# Patient Record
Sex: Male | Born: 1970 | Race: White | Hispanic: No
Health system: Midwestern US, Academic
[De-identification: ages and names within clinical notes are randomized; demographics above are authoritative.]

---

## 2016-04-23 MED ORDER — EMTRICITABINE-TENOFOVIR (TDF) 200-300 MG PO TAB
1 | ORAL_TABLET | Freq: Every day | ORAL | 3 refills | 30.00000 days | Status: DC
Start: 2016-04-23 — End: 2016-06-09

## 2016-06-09 MED ORDER — AZITHROMYCIN 1 GRAM PO PACK
1 | Freq: Once | ORAL | 0 refills | Status: AC
Start: 2016-06-09 — End: ?

## 2016-06-09 MED ORDER — ABACAVIR-DOLUTEGRAVIR-LAMIVUD 600-50-300 MG PO TAB
1 | ORAL_TABLET | Freq: Every day | ORAL | 3 refills | Status: DC
Start: 2016-06-09 — End: 2016-09-22

## 2016-06-28 ENCOUNTER — Encounter: Admit: 2016-06-28 | Discharge: 2016-06-28

## 2016-06-28 NOTE — Telephone Encounter
Attempted to call Jolaine Click to verify compliance and assess tolerance of his specialty medications (planned to switch to Triumeq). No answer. Left voicemail asking patient to return call to pharmacist at [phone].    Hubert Azure, PharmD, [place], AAHIVP  [phone]

## 2016-06-28 NOTE — Telephone Encounter
Periodic Patient Reassessment: Antiretroviral Therapy         Indication / Regimen     abacavir 600mg /lamivudine 300mg /dolutegravir 50mg  (Triumeq) - one tablet by mouth once daily       Charles Walls takes their medications in the morning with breakfast.    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 It is planned to continue indefinitely which is appropriate for Charles Walls. No renal or hepatic adjustments are required.    Subjective Clinical Assessment: 10 out of 10.   Subjective Quality of Life Measurement: In the past 30 days, Charles Walls was able to complete all normal daily activities    Response to HIV antiretroviral therapy:      Laboratory studies will be collected at his appointment in July to determine HIV virologic and immunologic response.       Labs:   CD4 Count   Date/Time Value Ref Range Status   05/21/2016 09:33 AM 804 440 - 2,160 Final          HIV Viral Load PCR   Date/Time Value Ref Range Status   05/21/2016 09:33 AM HIV-1 RNA Detected, <40 copies/mL <40 RNA COPIES/ML Final     Comment:     The test method detects HIV-1 viral load using the Abbott RealTime assay.    Please correlate results with the clinical status of the patient.         Adverse Effects:    Patient is not experiencing any significant adverse effects to this medication regimen.      Adherence:    Refill history was reviewed.  Patient reports missing 0 doses since switching to Triumeq on 06/12/16.   Patient was educated on importance of adherence.     Allergies     Allergies   Allergen Reactions   ??? Sulfa (Sulfonamide Antibiotics) SEE COMMENTS     Patient reports that his father and brother are allergic, he is unsure so he reports it as an allergy.         Pregnancy status    The patient???s pregnancy status was assessed and determined to be: Male, not applicable     Medication Reconciliation Medication history and reconciliation were performed (including prescription medications, supplements, over the counter, and herbal products). The medication list was updated and the patients??? current medication list is included below.    Home Medications    Medication Sig   abacavir-dolutegravir-lamivud (TRIUMEQ) 600-50-300 mg tablet Take 1 tablet by mouth daily. To replace Truvada, Darunavir/Ritonavir.   diphenhydrAMINE (BENADRYL) 25 mg capsule Take 25-50 mg by mouth at bedtime as needed.   GINSENG PO Take 1 tablet by mouth daily.       The patient was instructed to speak with their health care provider before starting any new drug, including prescription or over the counter, natural / herbal products, or vitamins.    Drug-Drug Interactions    Drug-drug interactions (DDIs) have been evaluated and there are no new significant drug interactions with Charles Walls???s anti-retroviral therapy to address with the patient at this time.      Drug-Food Interactions    Drug-food interactions were evaluated and there are no significant drug-food interactions. This medication can be taken with or without food.     Risk Evaluation and Mitigation Strategy (REMS) Assessment    No REMS is required for this medication.     Medication Education    Charles Hart  Ryland Walls was provided with drug information for their HIV medications.  Discussion with the patient included: the medication name, regimen, dosing, frequency, duration, proper administration, storage, monitoring, common side effects, safety precautions, and food/drug interactions to be aware of.  The patient was instructed to speak to ID clinic doctor or pharmacist prior to starting any new drug--including prescription or OTC, natural products, or vitamins.  Emphasis was placed on the importance of medication compliance.  The patient was instructed to seek medical attention immediately if experiencing signs of an allergic reaction, including: rash, hives, itching; red, swollen, blistered, or peeling skin with or without fever.  The patient was also encouraged to contact the pharmacist with any further questions.    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    No issues.    Reminded patient of his follow up appointment with Charles Walls in July.    Re-assessment has been completed and the patient will be re-assessed annually.      Charles Walls, PharmD, BCPS, AAHIVP  Phone [phone]  Pager [phone]

## 2016-07-05 ENCOUNTER — Encounter: Admit: 2016-07-05 | Discharge: 2016-07-05

## 2016-07-06 NOTE — Telephone Encounter
Last OV 06/09/16:    Plan:    - Offered ENT referral for audiogram. He declined.   - Prescription sent for 1g Azithromycin one time dose for exposure to Chlamydia.  - Start taking Vitamin D 2000 IU daily. These are OTC.   - Wants to switch to Triumeq: HLA B*[id number] negative and Archive genotype noted wild type virus.  - Follow up in 8 weeks for repeat labs (including Syphilis Ab).       Pt has appt 07/28/16

## 2016-07-08 MED ORDER — AZITHROMYCIN 500 MG PO TAB
ORAL_TABLET | 0 refills | Status: AC
Start: 2016-07-08 — End: 2016-09-01

## 2016-07-28 ENCOUNTER — Encounter: Admit: 2016-07-28 | Discharge: 2016-07-28

## 2016-07-28 DIAGNOSIS — B20 Human immunodeficiency virus [HIV] disease: Principal | ICD-10-CM

## 2016-07-29 ENCOUNTER — Ambulatory Visit: Admit: 2016-07-29 | Discharge: 2016-07-29 | Payer: No Typology Code available for payment source

## 2016-07-29 DIAGNOSIS — B20 Human immunodeficiency virus [HIV] disease: Principal | ICD-10-CM

## 2016-07-29 LAB — COMPREHENSIVE METABOLIC PANEL
Lab: 0.4 mg/dL (ref 0.3–1.2)
Lab: 1.1 mg/dL (ref 0.4–1.24)
Lab: 10 mg/dL — ABNORMAL HIGH (ref 8.5–10.6)
Lab: 117 U/L — ABNORMAL HIGH (ref 25–110)
Lab: 135 MMOL/L — ABNORMAL LOW (ref 137–147)
Lab: 27 MMOL/L (ref 21–30)
Lab: 3.9 MMOL/L (ref 3.5–5.1)
Lab: 4.3 g/dL (ref 3.5–5.0)
Lab: 6 K/UL (ref 3–12)
Lab: 8.5 g/dL — ABNORMAL HIGH (ref 6.0–8.0)
Lab: >60 mL/min — ABNORMAL HIGH (ref >60)

## 2016-07-29 LAB — SYPHILIS AB SCREEN: Lab: POSITIVE mL/min — AB (ref 0–0.45)

## 2016-07-29 LAB — CBC AND DIFF
Lab: 4.6 M/UL (ref 4.4–5.5)
Lab: 7.1 10*3/uL (ref 4.5–11.0)

## 2016-07-29 LAB — HEPATITIS C ANTIBODY W REFLEX HCV PCR QUANT: Lab: NEGATIVE mg/dL (ref 7–25)

## 2016-07-29 LAB — RPR SCREEN

## 2016-07-29 LAB — RPR TITER FOR TREATMENT: Lab: 64 — AB

## 2016-07-30 ENCOUNTER — Ambulatory Visit: Admit: 2016-07-30 | Discharge: 2016-07-31 | Payer: No Typology Code available for payment source

## 2016-07-30 ENCOUNTER — Encounter: Admit: 2016-07-30 | Discharge: 2016-07-30

## 2016-07-30 DIAGNOSIS — A539 Syphilis, unspecified: Principal | ICD-10-CM

## 2016-07-30 DIAGNOSIS — B20 Human immunodeficiency virus [HIV] disease: ICD-10-CM

## 2016-07-30 LAB — CHLAM/NG PCR URINE
Lab: NEGATIVE
Lab: NEGATIVE

## 2016-07-30 MED ORDER — PENICILLIN G BENZATHINE 1,200,000 UNIT/2 ML IM SYRG
2.4 10*6.[IU] | Freq: Once | INTRAMUSCULAR | 0 refills | Status: CP
Start: 2016-07-30 — End: ?

## 2016-07-30 NOTE — Telephone Encounter
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 Ask him to abstain from sex (until one week after treatment) and come in to get treated with 3 shots of Bicillin LA 2.4 million units weekly apart (make sure he is not allergic otherwise I'll ask Dr Cletus Gash to see him and discuss options).

## 2016-07-30 NOTE — Progress Notes
Patient here for Bicillin-LA IM shot. Denies having allergies to penicillin. Received 1.2 million units of Bicillin-LA in each buttocks (2.60million units total). Patient tolerated both injections well, waited for 15 minutes afterwards in waiting room, and had no signs of allergic reaction    Told pt that he will need to make a nurse visit for the next two Fridays pt understood

## 2016-07-30 NOTE — Telephone Encounter
Patient informed and will come in at 42 today for nurse visit.

## 2016-07-30 NOTE — Telephone Encounter
Called and LVM for return call.

## 2016-08-02 LAB — HIV VIRAL LOAD PCR QUANT

## 2016-08-06 ENCOUNTER — Ambulatory Visit: Admit: 2016-08-06 | Discharge: 2016-08-07 | Payer: No Typology Code available for payment source

## 2016-08-06 DIAGNOSIS — B20 Human immunodeficiency virus [HIV] disease: Principal | ICD-10-CM

## 2016-08-06 MED ORDER — PENICILLIN G BENZATHINE 1,200,000 UNIT/2 ML IM SYRG
2.4 10*6.[IU] | Freq: Once | INTRAMUSCULAR | 0 refills | Status: CP
Start: 2016-08-06 — End: ?

## 2016-08-06 NOTE — Progress Notes
Patient here for Bicillin-LA IM shot. Denies having allergies to penicillin. Received 1.2 million units of Bicillin-LA in each buttocks (2.2million units total). Patient tolerated both injections well, waited for 15 minutes afterwards in waiting room, and had no signs of allergic reaction    Pt has one more injection and will be here next Friday for final dose.

## 2016-08-19 ENCOUNTER — Encounter: Admit: 2016-08-19 | Discharge: 2016-08-19

## 2016-08-19 NOTE — Telephone Encounter
Patient called because he missed his last NV for 3rd bicillin treatment.    Per El Atrouni, must start series over again  Bicillin 2.4 million units x 3 weeks  Patient understands and will be here 8/3 at 2pm to restart treatment.  Patient understands he needs to come in 8/10 and 8/17 for the other two doses.

## 2016-08-20 ENCOUNTER — Ambulatory Visit: Admit: 2016-08-20 | Discharge: 2016-08-21 | Payer: No Typology Code available for payment source

## 2016-08-20 DIAGNOSIS — A539 Syphilis, unspecified: Principal | ICD-10-CM

## 2016-08-20 MED ORDER — PENICILLIN G BENZATHINE 1,200,000 UNIT/2 ML IM SYRG
2.4 10*6.[IU] | Freq: Once | INTRAMUSCULAR | 0 refills | Status: CP
Start: 2016-08-20 — End: ?

## 2016-08-20 NOTE — Progress Notes
Patient here for Bicillin-LA IM shot. Denies having allergies to penicillin. Received 1.2 million units times 2 of Bicillin-LA in the buttocks. Patient tolerated both injections well, waited for 15 minutes afterwards in waiting room, and had no signs of allergic reaction  Patient will return 8/10 and 8/17 for remaining two treatments

## 2016-08-27 ENCOUNTER — Ambulatory Visit: Admit: 2016-08-27 | Discharge: 2016-08-28 | Payer: No Typology Code available for payment source

## 2016-08-27 DIAGNOSIS — A539 Syphilis, unspecified: Principal | ICD-10-CM

## 2016-08-27 MED ORDER — PENICILLIN G BENZATHINE 1,200,000 UNIT/2 ML IM SYRG
1.2 10*6.[IU] | Freq: Once | INTRAMUSCULAR | 0 refills | Status: CP
Start: 2016-08-27 — End: ?

## 2016-08-27 NOTE — Progress Notes
Patient here for Bicillin-LA IM shot. Denies having allergies to penicillin. Received 1.2 million units times 2 of Bicillin-LA in the buttocks. Patient tolerated both injections well, waited for 15 minutes afterwards in waiting room, and had no signs of allergic reaction

## 2016-09-01 ENCOUNTER — Encounter: Admit: 2016-09-01 | Discharge: 2016-09-01

## 2016-09-01 ENCOUNTER — Ambulatory Visit: Admit: 2016-09-01 | Discharge: 2016-09-01 | Payer: No Typology Code available for payment source

## 2016-09-01 DIAGNOSIS — S129XXA Fracture of neck, unspecified, initial encounter: Principal | ICD-10-CM

## 2016-09-01 DIAGNOSIS — B20 Human immunodeficiency virus [HIV] disease: ICD-10-CM

## 2016-09-01 DIAGNOSIS — E785 Hyperlipidemia, unspecified: ICD-10-CM

## 2016-09-01 DIAGNOSIS — A539 Syphilis, unspecified: Principal | ICD-10-CM

## 2016-09-01 DIAGNOSIS — L409 Psoriasis, unspecified: ICD-10-CM

## 2016-09-01 DIAGNOSIS — T1491XA Suicide attempt, initial encounter: ICD-10-CM

## 2016-09-01 DIAGNOSIS — J40 Bronchitis, not specified as acute or chronic: ICD-10-CM

## 2016-09-01 MED ORDER — PENICILLIN G BENZATHINE 1,200,000 UNIT/2 ML IM SYRG
2.4 10*6.[IU] | Freq: Once | INTRAMUSCULAR | 0 refills | Status: CP
Start: 2016-09-01 — End: ?

## 2016-09-01 NOTE — Progress Notes
Periodic Patient Reassessment: Antiretroviral Therapy         Indication / Regimen     abacavir 600mg /lamivudine 300mg /dolutegravir 50mg  (Triumeq) - one tablet by mouth once daily       Tarran Piotrowski takes their medications in the morning with breakfast.  If he doesn't take with food, he is nauseated.    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 It is planned to continue indefinitely which is appropriate for Juliann Mule. No renal or hepatic adjustments are required.    Subjective Clinical Assessment: 10 out of 10.  Subjective Quality of Life Measurement: In the past 30 days, Saimon Rothbard was able to complete all normal daily activities    Response to HIV antiretroviral therapy:      Laboratory studies will be collected today to determine HIV virologic and immunologic response.       Labs:   CD4 Count   Date/Time Value Ref Range Status   05/21/2016 09:33 AM 804 440 - 2,160 Final          HIV Viral Load PCR   Date/Time Value Ref Range Status   07/29/2016 10:48 AM HIV-1 RNA Not Detected, <40 copies/mL <40 RNA COPIES/ML Final     Comment:     The test method detects HIV-1 viral load using the Abbott RealTime assay.    Please correlate results with the clinical status of the patient.         Adverse Effects:    Patient is not experiencing any significant adverse effects to this medication regimen.      Adherence:    Refill history was reviewed.  Patient reports missing 0 doses over the past 1 week and 0 doses over the past month.   Patient was educated on importance of adherence.      Allergies     Allergies   Allergen Reactions   ??? Sulfa (Sulfonamide Antibiotics) SEE COMMENTS     Patient reports that his father and brother are allergic, he is unsure so he reports it as an allergy.         Pregnancy status    The patient???s pregnancy status was assessed and determined to be: Male, not applicable Medication Reconciliation    Medication history and reconciliation were performed (including prescription medications, supplements, over the counter, and herbal products). The medication list was updated and the patients??? current medication list is included below.    Home Medications    Medication Sig   abacavir-dolutegravir-lamivud (TRIUMEQ) 600-50-300 mg tablet Take 1 tablet by mouth daily. To replace Truvada, Darunavir/Ritonavir.   GINSENG PO Take 1 tablet by mouth daily.       The patient was instructed to speak with their health care provider before starting any new drug, including prescription or over the counter, natural / herbal products, or vitamins.    Drug-Drug Interactions    Drug-drug interactions (DDIs) have been evaluated and there are no new significant drug interactions with Ulice Dash Bocek???s anti-retroviral therapy to address with the patient at this time.      Drug-Food Interactions    Drug-food interactions were evaluated and there are no significant drug-food interactions. This medication can be taken with or without food.     Risk Evaluation and Mitigation Strategy (REMS) Assessment    No REMS is required for this medication.     Medication Education    Delmonte Yerk was provided  with drug information for their HIV medications.  Discussion with the patient included: the medication name, regimen, dosing, frequency, duration, proper administration, storage, monitoring, common side effects, safety precautions, and food/drug interactions to be aware of.  The patient was instructed to speak to ID clinic doctor or pharmacist prior to starting any new drug--including prescription or OTC, natural products, or vitamins.  Emphasis was placed on the importance of medication compliance.  The patient was instructed to seek medical attention immediately if experiencing signs of an allergic reaction, including: rash, hives, itching; red, swollen, blistered, or peeling skin with or without fever.  The patient was also encouraged to contact the pharmacist with any further questions.    Additional Information    Outpatient Pharmacy: [facility] [place], [place] at [address] for other meds  Insurance:  Bryn Gulling, [place] ADAP  Juanell Fairly Program involvement: Earvin Hansen at [employer] is his case Animal nutritionist Comments    Continues on Triumeq with no issues to report.  He does take with food otherwise he is nauseated.    Switched his pharmacy for [facility] to [facility].    Re-assessment has been completed and the patient will be re-assessed annually.      Janan Ridge, PharmD, BCPS, AAHIVP  Phone [phone]  Pager [phone]

## 2016-09-01 NOTE — Progress Notes
Subjective:       History of Present Illness    Charles Walls is a 46 y.o. male here for HIV follow up.  ???  Patient has been followed by Dr Rosalio Macadamia previously until 2014 when he switched to [facility]. He was following Dr Ross Marcus until she closed her outpatient practice and he transferred to me.   Prior regimens Kaletra, Presizta, Viracept (diarrhea), Viread, Truvada, Combivir.   ??????  He reports he was diagnosed with HIV in 2000 while in [facility] (risk MSM). He then moved to [place] for few years and came back in 2006. He had relationship with women in the past, but was married to an HIV negative male. They are using condoms except for oral sex. They have no other partners for the past 2 years.   Ryatt was diagnosed with Chlamydia in June 2014 and treated. His partner was also treated.   He continues on Darunavir, Truvada, Norvir and has good virological and immunological response. He said he was seen q49months by his [place] doctor and would like this way.   ???  Currently feeling well. His feet are better after he started Kelfex and topical creams prescribed by Dermatology last week.   He is enrolled in RWSW at [facility] [place], and also has Media planner.  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 He is tested for TB there and has always been negative.  Still smokes 1 ppd and is interested in quitting but waiting for change in insurance in March to get discount on his premium.      ???  07/17/2014 Compliant with current ART (not eating with the pills). He separated with his partner  Since last time. He had one sexual encounter with a new partner. He is having burning and some discharge for the past week.   Drinking 6-8 beers a week. Smokes a ppd. Interested in cutting down but not now. Tried Chantix before but didn't work.   ???  01/01/2015  Ran out of his Wellbutrin and Remeron. Now with a runny nose and sore throat. Complaint with ART without side effects. Smoking trying to cut down. Still having dysuria and occasional bladder discomfort. Mostly top, versatile, with and without. Latest partner oral sex without protection. His 70 yo. dad back in the hospital. Oral mandibular cancer.  Trach/Peg for 10 years.  ???  06/18/2015  Doing well on current regimen and would like to keep it for now.   We discussed Triumeq but will discuss again next time as he has a trip to [place] coming up.    We talked about Menveo vaccine. His dad passed away last month and he is taking his ashes to [place].  Not currently sexually active. Last STD screen 12/2014. Last urine Chlamydia 06/2014.   No current dysuria.   He had mild diverticulitis in April 2017 and had a colonoscopy yesterday (diverticulosis, no biopsies).     02/11/2016  Truvada+Darunavir/ritonavir taking in the morning. Pt says no missed dose. He visited ED in Dec 2017 with alcohol intoxication (suicide attempt ?). No complaints except for chronic dry cough, mood OK. No sexual activity in this 6 months.   Taking over the counter Vitamin D and 'Jinseng'.    06/09/16   He is doing well. He is interviewing for jobs at [employer]. 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 He reports exposure to chlamydia. He was top and his partrner  later told him he was diagnosed with Chlamydia. He is asymptomatic. He reports ringing in his ear (right ear). He had it before but it wasn't that bad.   He reports last time when he got upset, he was having a bad day, he was seen in the ER drunk and suicidal and was kept overnight before being discharged. He feels fine now.     09/01/16  Has missed several visits to finish Bicillin LA treatment for syphilis. He restarted series again and is to get 3rd shot in 2 days.  08/12/16 [facility] RPR was 1:16.  Doing well on Triumeq.        Review of Systems   Constitutional: Negative.    HENT: Negative.    Eyes: Negative. Respiratory: Positive for cough.    Cardiovascular: Negative.    Gastrointestinal: Negative.    Endocrine: Negative.    Genitourinary: Negative.    Musculoskeletal: Negative.    Skin: Positive for rash.   Neurological: Negative.    Hematological: Negative.    Psychiatric/Behavioral: Negative.          Objective:         ??? abacavir-dolutegravir-lamivud (TRIUMEQ) 600-50-300 mg tablet Take 1 tablet by mouth daily. To replace Truvada, Darunavir/Ritonavir.   ??? GINSENG PO Take 1 tablet by mouth daily.     Vitals:    09/01/16 1617   BP: 118/62   Pulse: 96   Temp: 36.7 ???C (98.1 ???F)   Weight: 64.3 kg (141 lb 12.8 oz)   Height: 165.1 cm (65)     Body mass index is 23.6 kg/m???.     Physical Exam    Constitutional: He appears well-developed and well-nourished. No distress.   HENT: ears unremarkable  Head: Normocephalic and atraumatic.   Eyes: No scleral icterus.   Cardiovascular: Normal rate.    Pulmonary/Chest: Effort normal.   Musculoskeletal: He exhibits no edema.   Neurological: He is alert. He exhibits normal muscle tone.   Skin: No rash noted.   Psychiatric: He has a normal mood and affect.   Vitals reviewed.       Assessment:  HIV   ??? Diagnosed 2000 while in [place]. Dr Jon Billings [facility]).   ??? Establishment of care with [facility] : 07/31/13  ??? Mode of transmission / risk factors : MSM  ??? CD4 count / viral load at the time of diagnosis : not sure , likely CD 4 around 250  ??? History of OI : none reported  ??? Need for prophylaxis : none reported  ??? FePO4 14% (within normal)  ???  Important HIV data Viral load/ CD4 count, ART therapy ( last 2 years ) :  Date   CD4 ( % )  Viral load ( log)  ART therapy  Comments / genotype Resistence testing     11/28/13  In process  <40 (<1.6)  ??? ???   08/27/13  993(48.7)  <40 (<1.6)  ??? ???   06/13/13  ??? 54 (1.73)  ([place])  ???   03/30/13  Not done  <20(<1.3)  ??? ???   03/26/13  724(57%)?  24(1.380  ??? ???   01/15/13  1124(48)  <20 (<1.3)  ??? ???   10/23/12  1154  <20(<1.3)  ??? ???   08/23/12  901  <20(<1.3)  ??? ??? 07/27/12  991  <20(<1.3)  Likely switched to new regimen daruavir and ritonavir instead of Kaletra  (mid 2014)    06/30/12  1052  <20 ND (<  1.3)  ??? ???   05/08/12  948(46)  3891.58  ??? ???   03/27/12  Not done  22(1.34)  ??? ???   03/13/12  Not done  31(1.4)  ??? ???   02/25/12  879(47)  41(1.61)  ??? ???   02/11/12  962(43)  34(1.53)  ??? ???   10/27/12  1072(46)  61(1.79)  ??? ???   01/14/12  849(50)  46(1.66)  Was on Keletra and Truvada  ???   12/23/11  936 (46)  53 (1.72)  ??? ???   ???  Base line lab parameter / screening   ??? TB test : T spot TB test negative on 07/31/13  ??? Toxoplasma serology: IgG negative on 07/31/13  ??? Hepatitis serology : 07/31/13  ??? Hepatitis BS antigen : negative  ??? Hepatitis BS antibody : 10.8. (immune)  ??? Hepatitis B core antibody: negative  ??? Hepatitis A antibody: positive  ??? Hepatis C antibody: negative  ???  Immunizations   ??? Hepatitis vaccine : received both A&B / patient : received booster Hepatitis B vaccine 10/01/13  ??? Tdap vaccine : 05/11/13  ??? Pneumo vax : Prevnar X1 ( 07/31/13), PPSV 23 ( 10/01/13)  ??? Menveo in 2017  ??? Annual flu shot  ???  ???  H/O STD / STD screening / anal pap/ risk factors   ??? H/O STDs : H/O chlamydia 07/27/12 : treated  ??? Syphilis test : negative / patient in 07/2012  ??? Urine for GC / CT PCR : 07/31/13 Negative , syphilis negative  ??? Anal pap smear : 2-3 years ago , negative / patient  ??? On going risk factors :  ??? H/O multiple sexual partners in the past.  ??? previously having monogamous relationship with an HIV negative partner (now married). He reports practicing protected sex and sometimes he also practices oral sex without protection.   ??? Anal pap discussed 02/2014. He declined.     ???  Prolonged ART therapy / lab monitoring   ??? CBC diff / CMP ( most recently done on 11/28/13 ).  ??? Lipid panel 06/13/13 TC 172 TG 78 LDL 119 HDL 36.  ??? UA 07/31/13 protein negative , glucose negative.  ??? hemoglobin A1C 5.2 06/13/13.  ??? Vitamin D level adequate ( 07/31/13) 34.5.  ???  Other co morbidities   ??? History of tinea pedis. ??? Hyperlipidemia.  ??? Tobacco dependence --> trying to cu down. Not drinking beer anymore.   ??? History of neck pain. Seen neurosurgery in the past per note.  ??? Chronic cough : currently being addressed by PCP : 25 year pack smoking history.  ??? Headache. Head CT unremarkable 11/28/13.  ??? 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      Plan:    - Will get 3rd dose of Bicillin LA today   - Continue Vitamin D 2000 IU daily. These are OTC.   - Continue Triumeq.  - Follow up in 8 weeks for repeat labs (including Syphilis Ab).

## 2016-09-01 NOTE — Progress Notes
Patient here for Bicillin-LA IM shot. Denies having allergies to penicillin. Received 1.2 million units times 2 of Bicillin-LA in the buttocks. Patient tolerated both injections well, waited for 15 minutes afterwards in waiting room, and had no signs of allergic reaction

## 2016-09-22 ENCOUNTER — Encounter: Admit: 2016-09-22 | Discharge: 2016-09-22

## 2016-09-22 MED ORDER — TRIUMEQ 600-50-300 MG PO TAB
ORAL_TABLET | Freq: Every day | 3 refills | Status: AC
Start: 2016-09-22 — End: 2017-02-14

## 2016-09-22 NOTE — Telephone Encounter
Per last OV note 09/01/2016:  Plan:      - Continue Triumeq.

## 2016-09-29 ENCOUNTER — Encounter: Admit: 2016-09-29 | Discharge: 2016-09-29

## 2016-09-29 DIAGNOSIS — A539 Syphilis, unspecified: ICD-10-CM

## 2016-09-29 DIAGNOSIS — B20 Human immunodeficiency virus [HIV] disease: Principal | ICD-10-CM

## 2016-09-29 NOTE — Telephone Encounter
Patient informed to keep appointment with PCP next Wednesday and if they have any concerns, they can reach out to [place]. Patient also states he never saw Dr. Mayra Reel on 09/01/2016 (since starting syphilis treatment).  I explained to the patient that he saw Dr. Mayra Reel but he was very insistent that he did not.  He also states he had labs drawn multiple times since being diagnosed and has more recent labs since 07/29/2016.  I asked him if they were drawn through the health department and he stated that we drew them.  He says he will check his MyChart to see what the dates were since I cant "seem to find them".    I advised the patient that if his rash worsened, he can be seen in an urgent care to be evaluated.  He says, "you said there is nothing the doctor can do so why would I go to an urgent care".  I explained that that is not what I said, and that I stated that over the phone, there is not much we can do, and that is why he needs to be evaluated in person by his PCP but if it gets worse while waiting, he can see an UC doctor.    Voiced understanding.  Will discuss with El Atrouni when he gets back.

## 2016-09-29 NOTE — Telephone Encounter
Patient called stating his rash is back that he had when he was diagnosed with syphilis.  It is a fine rash that is darker than last time, on the back of his calves, behind his knees and on multiple other areas of his body.  He states it is the exact rash he had last time.    No fevers. He also states he has had no new exposures since being treated.  Advised that Dr. Mayra Reel is out of the country and he needs to reach out to his PCP to be seen to have the rash evaluated. His PCN series was complete for syphilis treatment on 09/01/2016.   I advised that I will reach out to him if I receive any other notification.

## 2016-10-04 NOTE — Telephone Encounter
Spoke with Charles Walls who is ok with patient repeating his RPR titer.  Patient informed.  Will come in and have it drawn when he is on campus for his PCP appt.

## 2016-10-06 ENCOUNTER — Ambulatory Visit: Admit: 2016-10-06 | Discharge: 2016-10-06 | Payer: No Typology Code available for payment source

## 2016-10-06 ENCOUNTER — Encounter: Admit: 2016-10-06 | Discharge: 2016-10-06

## 2016-10-06 DIAGNOSIS — E785 Hyperlipidemia, unspecified: ICD-10-CM

## 2016-10-06 DIAGNOSIS — R21 Rash and other nonspecific skin eruption: ICD-10-CM

## 2016-10-06 DIAGNOSIS — R0683 Snoring: ICD-10-CM

## 2016-10-06 DIAGNOSIS — S129XXA Fracture of neck, unspecified, initial encounter: Principal | ICD-10-CM

## 2016-10-06 DIAGNOSIS — B20 Human immunodeficiency virus [HIV] disease: ICD-10-CM

## 2016-10-06 DIAGNOSIS — Z8619 Personal history of other infectious and parasitic diseases: ICD-10-CM

## 2016-10-06 DIAGNOSIS — L409 Psoriasis, unspecified: ICD-10-CM

## 2016-10-06 DIAGNOSIS — J40 Bronchitis, not specified as acute or chronic: ICD-10-CM

## 2016-10-06 DIAGNOSIS — L989 Disorder of the skin and subcutaneous tissue, unspecified: ICD-10-CM

## 2016-10-06 DIAGNOSIS — R0602 Shortness of breath: ICD-10-CM

## 2016-10-06 DIAGNOSIS — Z720 Tobacco use: Principal | ICD-10-CM

## 2016-10-06 DIAGNOSIS — A539 Syphilis, unspecified: ICD-10-CM

## 2016-10-06 DIAGNOSIS — T1491XA Suicide attempt, initial encounter: ICD-10-CM

## 2016-10-06 LAB — COMPREHENSIVE METABOLIC PANEL
Lab: 139 MMOL/L (ref 137–147)
Lab: 4.4 MMOL/L (ref 3.5–5.1)
Lab: >60 mL/min (ref >60)
Lab: >60 mL/min (ref >60)

## 2016-10-06 LAB — CBC
Lab: 4.4 M/UL (ref 4.4–5.5)
Lab: 6.8 10*3/uL (ref 4.5–11.0)

## 2016-10-06 NOTE — Progress Notes
Date of Service: 10/06/2016    Charles Walls is a 46 y.o. male. DOB: December 02, 1970   MRN#: [id number]    Subjective:   Chief Complaint   Patient presents with   ??? Rash          History of Present Illness  Charles Walls is 46 y.o. patient who presents to clinic for skin rash.     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  He had a rash all over his body.  He has been adequately treated with penicillin injections at this time.  So has his partner.  They have been practicing safe sex with use of condoms.  Last week patient noticed red welts all over his legs and it looks similar to the rash she had with syphilis.  He states they looked like bug bites all over his legs.  He had been spending a lot of time outside.  He noticed a few around his hips as well.  This scared him so he called his infectious disease doctor and a repeat RPR has been ordered.  He will get this lab completed today after visit.  Overall the rash has completely resolved since last week.  He is now questioning if the red papules he noted were bug bites because they are all gone.    Patient is requesting a dermatology referral for skin check.    Over the last few weeks there have been times her patient feels he cannot catch his breath.  He states there is a heaviness in his lungs and it is hard to take a full deep breath. This occurs at rest and with exertion. No true patterns associated with it. He is a smoker and greater than 32-pack-year history.  He is not interested in quitting smoking.  He had pulmonary function testing completed in 2015 that was normal.  He has never been told he has COPD or asthma.  He is not coughing or wheezing.  There is no fever or chills.  No mucus production.  He also reports he has noticed himself stop breathing during his sleep.  He snores a lot.  He questions why he does this and states when he stops breathing it scares him and his partner.    Past Medical History:   Diagnosis Date ??? Bronchitis 07/17/2015   ??? Fracture cervical vertebra-closed (HCC)     c6, c7 - no surgical repair   ??? Human immunodeficiency virus (HIV) disease (HCC)    ??? Hyperlipidemia 06/13/2013   ??? Psoriasis, unspecified 02/21/2014   ??? Suicide attempt [facility])        Past Surgical History:   Procedure Laterality Date   ??? PR COLONOSCOPY FLX DX W/COLLJ SPEC WHEN PFRMD N/A 06/17/2015    COLONOSCOPY performed by Jolee Ewing, MD at ENDO[facility]GI   ??? HX APPENDECTOMY     ??? PR LAPAROSCOPY SURG RPR INITIAL INGUINAL HERNIA      right       family history includes Cancer in his father; Melanoma in his father.    Social History     Social History   ??? Marital status: Single     Spouse name: N/A   ??? Number of children: N/A   ??? Years of education: N/A     Occupational History   ??? unemployed      Social History Main Topics   ??? Smoking status: Current Every Day Smoker     Packs/day: 0.75     Years: 25.00  Types: Cigarettes   ??? Smokeless tobacco: Never Used   ??? Alcohol use 0.0 oz/week   ??? Drug use: Yes     Types: Cocaine   ??? Sexual activity: Yes     Partners: Male     Other Topics Concern   ??? Not on file     Social History Narrative    [place]       Social History   Substance Use Topics   ??? Smoking status: Current Every Day Smoker     Packs/day: 0.75     Years: 25.00     Types: Cigarettes   ??? Smokeless tobacco: Never Used   ??? Alcohol use 0.0 oz/week            Review of Systems   Constitution: Positive for weakness and malaise/fatigue. Negative for chills, fever and weight loss.   Cardiovascular: Negative for chest pain, palpitations and syncope.   Respiratory: Positive for shortness of breath. Negative for cough and wheezing.    Skin: Positive for rash.   Psychiatric/Behavioral: Negative for altered mental status.         Objective:     ??? TRIUMEQ 600-50-300 mg tablet TAKE 1 TABLET BY MOUTH DAILY     Vitals:    10/06/16 0953   BP: 131/77   Pulse: 88   Resp: 16   Temp: 36.7 ???C (98.1 ???F)   TempSrc: Oral   Weight: 68.5 kg (151 lb) Height: 162.6 cm (64)     Body mass index is 25.92 kg/m???.         Physical Exam   Constitutional: He is oriented to person, place, and time. He appears well-developed and well-nourished. No distress.   HENT:   Head: Normocephalic and atraumatic.   Eyes: Conjunctivae and EOM are normal.   Neck: Neck supple.   Cardiovascular: Normal rate and regular rhythm.    Pulmonary/Chest: Effort normal and breath sounds normal. No respiratory distress. He has no wheezes. He has no rales.   Musculoskeletal: He exhibits no edema.   Neurological: He is alert and oriented to person, place, and time.   Skin: Skin is warm and dry. He is not diaphoretic. No pallor.   No visible or palpable rash on the lower extremities   There is on red papule on the R lower back, likely mosquito bite   Psychiatric: He has a normal mood and affect. His behavior is normal. Judgment and thought content normal.   Nursing note and vitals reviewed.         Assessment and Plan:  1. Tobacco abuse    2. Shortness of breath    3. Snoring    4. Skin lesions    5. History of syphilis      Skin rash:  Discussed with patient that there is no visible rash on the lower extremities, patient agrees today and states what he saw last week has completely resolved. There is one red papule that is noticed on the lower back that is likely mosquito bite  Patient was very concerned that rash he saw last week was indicative of persistent syphilis infection   ID has ordered repeat RPR and I encouraged to have this completed today to ensure resolution of infection    Tobacco abuse and shortness of breath:  >32 pack year history, patient continues to smoke 1ppd  Intermittent shortness of breath noted, lungs feel heavy. No associated chest pain, palpitations or dizziness.  Problem List     Tobacco abuse  Will obtain chest xray today  PFTs ordered  Continue to monitor and look for patterns, alert clinic if worsening  CBC and CMP today    Snoring/reported apnea: Patient is high risk for sleep apnea  Sleep study ordered today   STOP BANG Questionnaire   Snoring:  Do you snore loudly (louder than talking or loud enough to be heard through closed doors)? Yes    Tired:  Do you often feel tired, fatigued, or sleepy during the day?  Yes    Observed:  Has anyone observed that you stop breathing during your sleep?  Yes    Blood Pressure:  Do you have or are you being treated for high blood pressure?  No   BMI  BMI more than 35 kg/m2?  Patient's BMI: Estimated body mass index is 25.92 kg/m??? as calculated from the following:    Height as of this encounter: 162.6 cm (64).    Weight as of this encounter: 68.5 kg (151 lb).  No   Age  Age over 50 years?  Patient's age: 46 y.o.  No   Neck  Neck circumference greater than 40 cm?  Yes , 40.5cm   Gender  Gender, male?  Yes      Total: 5        HIV   Continue Triumeq and follow up with ID    Return to see me PRN. Encouraged patient to schedule follow up with PCP in 1-2 months.    Orders Placed This Encounter   ??? CHEST 2 VIEWS   ??? CBC today   ??? COMPREHENSIVE METABOLIC PANEL   ??? DERMATOLOGY   ??? SLEEP STUDIES   ??? COMPLETE PULMONARY FUNCTIONS     No orders of the defined types were placed in this encounter.    No future appointments.  Patient Instructions   It was nice to see you today. Thank you for coming into clinic.    Please stop by the lab today  I have ordered the sleep study and pulmonary function testing  Get chest xray today  Return to see me as needed  I have referred you to dermatology    Routine Clinic Information:  Please don't hesitate to call if you have any problems or questions. My nurse is Florentina Addison and she can be reached at [phone].  If she does not answer, please leave a voicemail as she is probably rooming other patients.You may also message [place] in MyChart.    For refills on medications, please have your pharmacy fax a refill authorization request form to our office at Fax) [phone]. Please allow at least 3 business days for refill requests.     For urgent issues after business hours/weekends/holidays call [phone] and request for the outpatient internal medicine physician to be paged    We offer same day appointments for your acute health concerns. These appointments are on a first come, first serve basis. I am available for these appointments daily- just ask for [facility] if you would like to be seen. Please call [phone] if you would like to make an appointment.     Take care,     Arma Reining and [employer], APRN-NP

## 2016-10-07 LAB — RPR TITER FOR TREATMENT

## 2016-10-07 LAB — RPR TITER-QUANT: Lab: 2

## 2016-11-24 ENCOUNTER — Ambulatory Visit: Admit: 2016-11-24 | Discharge: 2016-11-24 | Payer: No Typology Code available for payment source

## 2016-11-24 ENCOUNTER — Encounter: Admit: 2016-11-24 | Discharge: 2016-11-24

## 2016-11-24 DIAGNOSIS — Z7252 High risk homosexual behavior: ICD-10-CM

## 2016-11-24 DIAGNOSIS — B20 Human immunodeficiency virus [HIV] disease: ICD-10-CM

## 2016-11-24 DIAGNOSIS — R0602 Shortness of breath: ICD-10-CM

## 2016-11-24 DIAGNOSIS — L409 Psoriasis, unspecified: ICD-10-CM

## 2016-11-24 DIAGNOSIS — S129XXA Fracture of neck, unspecified, initial encounter: Principal | ICD-10-CM

## 2016-11-24 DIAGNOSIS — T1491XA Suicide attempt, initial encounter: ICD-10-CM

## 2016-11-24 DIAGNOSIS — E785 Hyperlipidemia, unspecified: ICD-10-CM

## 2016-11-24 DIAGNOSIS — Z23 Encounter for immunization: ICD-10-CM

## 2016-11-24 DIAGNOSIS — J40 Bronchitis, not specified as acute or chronic: ICD-10-CM

## 2016-11-24 LAB — COMPREHENSIVE METABOLIC PANEL
Lab: 0.4 mg/dL (ref 0.3–1.2)
Lab: 1 mg/dL (ref 0.4–1.24)
Lab: 10 mg/dL (ref 8.5–10.6)
Lab: 102 mg/dL — ABNORMAL HIGH (ref 70–100)
Lab: 135 MMOL/L — ABNORMAL LOW (ref 137–147)
Lab: 15 U/L (ref 7–56)
Lab: 17 U/L (ref 7–40)
Lab: 26 MMOL/L (ref 21–30)
Lab: 3.8 MMOL/L (ref 3.5–5.1)
Lab: 5 g/dL (ref 3.5–5.0)
Lab: 8 (ref 3–12)
Lab: 8 g/dL (ref 6.0–8.0)
Lab: 80 U/L (ref 25–110)
Lab: >60 mL/min (ref >60)
Lab: >60 mL/min (ref >60)

## 2016-11-24 LAB — CHLAM/NG PCR URINE
Lab: NEGATIVE
Lab: NEGATIVE

## 2016-11-24 LAB — CBC AND DIFF
Lab: 4.5 M/UL (ref 4.4–5.5)
Lab: 7.3 10*3/uL (ref 4.5–11.0)

## 2016-11-24 LAB — TESTOSTERONE,TOTAL: Lab: 515 ng/dL (ref 270–1070)

## 2016-11-24 LAB — RPR TITER FOR TREATMENT: Lab: 2 MMOL/L — AB (ref 98–110)

## 2016-11-24 NOTE — Progress Notes
Administered 0.5mL of high dose trivalent influenza vaccine into right deltoid. Patient signed consent and was given VIS handout.  Injection was tolerated well and patient had no other questions or concerns at this time.

## 2016-11-24 NOTE — Progress Notes
Periodic Patient Reassessment: Antiretroviral Therapy         Indication / Regimen     abacavir 600mg /lamivudine 300mg /dolutegravir 50mg  (Triumeq) - one tablet by mouth once daily     Charles Walls takes their medications at 9:00am.      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 It is planned to continue indefinitely which is appropriate for Charles Walls. No renal or hepatic adjustments are required.    Patient assessments:   Subjective clinical assessment: on a scale of 1 to 10, the patient rates they are feeling 10 out of 10 while on treatment.    Subjective Quality of Life Measurement: Charles Walls was able to complete all normal daily activities over the past 30 days.    Response to HIV antiretroviral therapy:      Patient has improved HIV virologic and immunologic response.        Labs:   CD4 Count   Date/Time Value Ref Range Status   05/21/2016 09:33 AM 804 440 - 2,160 Final          HIV Viral Load PCR   Date/Time Value Ref Range Status   07/29/2016 10:48 AM HIV-1 RNA Not Detected, <40 copies/mL <40 RNA COPIES/ML Final     Comment:     The test method detects HIV-1 viral load using the Abbott RealTime assay.    Please correlate results with the clinical status of the patient.         Adverse Effects:    ? Charles Walls is not experiencing any significant adverse effects to this medication regimen.    Adherence:    Refill history was reviewed.  Patient reports missing 0 doses over the past week and 0 doses over the past month.   Patient was educated on importance of adherence.    Allergies     Allergies   Allergen Reactions   ??? Sulfa (Sulfonamide Antibiotics) SEE COMMENTS     Patient reports that his father and brother are allergic, he is unsure so he reports it as an allergy.         Pregnancy status    Pregnancy status was assessed and determined to be: Male: education not applicable. Medication Reconciliation    Medication history and reconciliation were performed (including prescription medications, supplements, over the counter, and herbal products). The medication list was updated and the patients??? current medication list is included below.    Home Medications    Medication Sig   TRIUMEQ 600-50-300 mg tablet TAKE 1 TABLET BY MOUTH DAILY       The patient was instructed to speak with their health care provider before starting any new drug, including prescription or over the counter, natural / herbal products, or vitamins.    Drug-Drug Interactions     No new significant drug-drug or drug-food interactions were identified.     Drug-Food Interactions    Drug-food interactions were evaluated and there are no significant drug-food interactions. This medication may be taken with or without food.     Risk Evaluation and Mitigation Strategy (REMS) Assessment    No REMS is required for this medication.     Medication Education    Charles Walls was provided with drug information for their HIV medications.  Discussion with the patient included: the medication name, regimen, dosing, frequency, duration, proper administration, storage, monitoring, common side effects, safety precautions, and food/drug interactions  to be aware of.  The patient was instructed to speak to ID clinic doctor or pharmacist prior to starting any new drug--including prescription or OTC, natural products, or vitamins.  Emphasis was placed on the importance of medication compliance.  The patient was instructed to seek medical attention immediately if experiencing signs of an allergic reaction, including: rash, hives, itching; red, swollen, blistered, or peeling skin with or without fever.  The patient was also encouraged to contact the pharmacist with any further questions.    Additional Information    Outpatient Pharmacy: [facility]  Insurance: [employer] Insurance and [place] ADAP for co-pays [employer] Program involvement: yes, Earvin Hansen at [facility] is his case Production designer, theatre/television/film    Additional Comments    Mr. Friis is doing well with his medications; he takes Triumeq every morning at 9am and denies any side effects or missed doses.  He denied taking any other medications (prescription or over the counter) aside from occasional vitamin C when he feels like he is getting sick.      He later admitted to Dr. Colon Flattery that he has been taking some male enhancement supplements called Magnum 24k, Woody-z, and one other one.  He also admits to buying Viagra from a friend.  He says in the last few months he has taken these medications less than 5 times and only takes them when needed.  We discussed that there is no data regarding drug interactions with his Triumeq and why we recommend against taking them.  He expressed understanding of the risk but admits he likely will continue to take them.  He has not had a viral load since he started them; checking that today.  He is very interested in a prescription for a ED and we are checking a testosterone level today.  We discussed the risks associated with these medications and why we want to be sure we are prescribing them appropriately.       He switched pharmacies from [facility] to [facility] recently and said he had some issues get his medications initially but he thinks it has been worked out now.  I offered we could fill through our pharmacy her at [facility] but he declined at this time.      Re-assessment has been completed and the patient will be re-assessed annually.      Lenice Pressman, PharmD, BCPS, AAHIVP  Phone [phone]

## 2016-11-25 LAB — CD4 ABSOLUTE CELL COUNT
Lab: 128 g/dL (ref 440–2160)
Lab: 52 % (ref 28–63)

## 2016-11-25 LAB — HIV VIRAL LOAD PCR QUANT

## 2017-01-05 ENCOUNTER — Encounter: Admit: 2017-01-05 | Discharge: 2017-01-05

## 2017-01-05 NOTE — Telephone Encounter
Mychart message sent to patient regarding lab results per Dr. Hampton Abbot recommendations.

## 2017-01-05 NOTE — Telephone Encounter
-----   Message from [facility], MD sent at 01/05/2017  1:13 PM CST -----  Labs last time were ok including STD screen and total testosterone.

## 2017-01-20 ENCOUNTER — Encounter: Admit: 2017-01-20 | Discharge: 2017-01-20

## 2017-02-01 ENCOUNTER — Encounter: Admit: 2017-02-01 | Discharge: 2017-02-02

## 2017-02-01 ENCOUNTER — Encounter: Admit: 2017-02-01 | Discharge: 2017-02-01

## 2017-02-01 DIAGNOSIS — K5792 Diverticulitis of intestine, part unspecified, without perforation or abscess without bleeding: ICD-10-CM

## 2017-02-01 DIAGNOSIS — K5732 Diverticulitis of large intestine without perforation or abscess without bleeding: Principal | ICD-10-CM

## 2017-02-01 MED ORDER — FENTANYL CITRATE (PF) 50 MCG/ML IJ SOLN
25 ug | INTRAVENOUS | 0 refills | Status: DC | PRN
Start: 2017-02-01 — End: 2017-02-02
  Administered 2017-02-02: 06:00:00 25 ug via INTRAVENOUS

## 2017-02-02 ENCOUNTER — Inpatient Hospital Stay
Admit: 2017-02-02 | Discharge: 2017-02-04 | Disposition: A | Discharge status: Home / Self Care | Payer: No Typology Code available for payment source | Source: Other Acute Inpatient Hospital

## 2017-02-02 DIAGNOSIS — K5732 Diverticulitis of large intestine without perforation or abscess without bleeding: Principal | ICD-10-CM

## 2017-02-02 LAB — LIPID PROFILE
Lab: 106 mg/dL (ref <200)
Lab: 39 mg/dL — ABNORMAL LOW (ref >40)
Lab: 59 mg/dL (ref <100)

## 2017-02-02 LAB — COMPREHENSIVE METABOLIC PANEL
Lab: 0.5 mg/dL (ref 0.3–1.2)
Lab: 0.9 mg/dL (ref 0.4–1.24)
Lab: 10 mg/dL (ref 7–25)
Lab: 105 mg/dL — ABNORMAL HIGH (ref 70–100)
Lab: 138 MMOL/L — ABNORMAL LOW (ref 137–147)
Lab: 3 10*3/uL (ref 3–12)
Lab: 3.5 g/dL (ref 3.5–5.0)
Lab: 5.6 g/dL — ABNORMAL LOW (ref 6.0–8.0)
Lab: 55 U/L — ABNORMAL LOW (ref 25–110)
Lab: 8.7 mg/dL (ref 8.5–10.6)
Lab: 9 U/L (ref 7–40)
Lab: >60 mL/min (ref >60)

## 2017-02-02 LAB — LACTIC ACID(LACTATE): Lab: 0.7 MMOL/L (ref 0.5–2.0)

## 2017-02-02 LAB — TROPONIN-I: Lab: 0 ng/mL (ref <150)

## 2017-02-02 LAB — PROTIME INR (PT): Lab: 1.3 MMOL/L — ABNORMAL HIGH (ref 0.8–1.2)

## 2017-02-02 LAB — MAGNESIUM: Lab: 1.8 mg/dL (ref 1.6–2.6)

## 2017-02-02 LAB — BENZODIAZEPINES-URINE RANDOM: Lab: NEGATIVE

## 2017-02-02 LAB — CBC AND DIFF
Lab: 0 10*3/uL (ref 0–0.20)
Lab: 0.1 10*3/uL (ref 0–0.45)
Lab: 9.3 10*3/uL (ref 4.5–11.0)

## 2017-02-02 LAB — PHOSPHORUS: Lab: 3 mg/dL (ref 2.0–4.5)

## 2017-02-02 LAB — OPIATES-URINE RANDOM: Lab: NEGATIVE

## 2017-02-02 LAB — AMPHETAMINES-URINE RANDOM: Lab: POSITIVE — AB

## 2017-02-02 LAB — CANNABINOIDS-URINE RANDOM: Lab: NEGATIVE

## 2017-02-02 LAB — BARBITURATES-URINE RANDOM: Lab: NEGATIVE

## 2017-02-02 LAB — PHENCYCLIDINES-URINE RANDOM: Lab: NEGATIVE

## 2017-02-02 LAB — TSH WITH FREE T4 REFLEX: Lab: 0.3 uU/mL (ref 0.35–5.00)

## 2017-02-02 LAB — HEMOGLOBIN A1C: Lab: 5.5 % (ref 4.0–6.0)

## 2017-02-02 LAB — COCAINE-URINE RANDOM: Lab: NEGATIVE

## 2017-02-02 MED ORDER — ABACAVIR SULFATE/DOLUTEGRAVIR SULFATE/LAMIVUDINE 600/50/300 MG TAB
Freq: Every day | ORAL | 0 refills | Status: DC
Start: 2017-02-02 — End: 2017-02-04
  Administered 2017-02-02 – 2017-02-04 (×9): via ORAL

## 2017-02-02 MED ORDER — METRONIDAZOLE IN NACL (ISO-OS) 500 MG/100 ML IV PGBK
500 mg | INTRAVENOUS | 0 refills | Status: DC
Start: 2017-02-02 — End: 2017-02-04
  Administered 2017-02-02 – 2017-02-04 (×8): 500 mg via INTRAVENOUS

## 2017-02-02 MED ORDER — LACTATED RINGERS IV SOLP
1000 mL | INTRAVENOUS | 0 refills | Status: AC
Start: 2017-02-02 — End: ?
  Administered 2017-02-02 – 2017-02-03 (×5): 1000 mL via INTRAVENOUS

## 2017-02-02 MED ORDER — OXYCODONE 5 MG PO TAB
5 mg | ORAL | 0 refills | Status: DC | PRN
Start: 2017-02-02 — End: 2017-02-04
  Administered 2017-02-02 – 2017-02-04 (×3): 5 mg via ORAL

## 2017-02-02 MED ORDER — HYDROCODONE-ACETAMINOPHEN 5-325 MG PO TAB
1 | ORAL | 0 refills | Status: DC | PRN
Start: 2017-02-02 — End: 2017-02-02
  Administered 2017-02-02: 15:00:00 1 via ORAL

## 2017-02-02 MED ORDER — ENOXAPARIN 40 MG/0.4 ML SC SYRG
40 mg | Freq: Every day | SUBCUTANEOUS | 0 refills | Status: DC
Start: 2017-02-02 — End: 2017-02-04
  Administered 2017-02-02 – 2017-02-04 (×3): 40 mg via SUBCUTANEOUS

## 2017-02-02 MED ORDER — PANTOPRAZOLE 40 MG PO TBEC
40 mg | Freq: Every day | ORAL | 0 refills | Status: DC
Start: 2017-02-02 — End: 2017-02-04
  Administered 2017-02-02 – 2017-02-04 (×3): 40 mg via ORAL

## 2017-02-02 MED ORDER — CEFTRIAXONE INJ 1GM IVP
1 g | INTRAVENOUS | 0 refills | Status: DC
Start: 2017-02-02 — End: 2017-02-04
  Administered 2017-02-02 – 2017-02-04 (×3): 1 g via INTRAVENOUS

## 2017-02-03 ENCOUNTER — Encounter: Admit: 2017-02-03 | Discharge: 2017-02-03

## 2017-02-03 ENCOUNTER — Inpatient Hospital Stay: Admit: 2017-02-03 | Discharge: 2017-02-03

## 2017-02-03 DIAGNOSIS — K5732 Diverticulitis of large intestine without perforation or abscess without bleeding: Principal | ICD-10-CM

## 2017-02-03 LAB — CBC AND DIFF: Lab: 8.1 K/UL — ABNORMAL HIGH (ref >60)

## 2017-02-03 LAB — COMPREHENSIVE METABOLIC PANEL: Lab: 133 MMOL/L — ABNORMAL LOW (ref 137–147)

## 2017-02-03 MED ORDER — BARIUM SULFATE 700 MG PO TAB
700 mg | Freq: Once | ORAL | 0 refills | Status: CP
Start: 2017-02-03 — End: ?
  Administered 2017-02-03: 15:00:00 700 mg via ORAL

## 2017-02-03 MED ORDER — SOD BICARB-CITRIC AC-SIMETH 2.21-1.53 GRAM/4 GRAM PO GREP
1 | Freq: Once | ORAL | 0 refills | Status: CP
Start: 2017-02-03 — End: ?

## 2017-02-03 MED ORDER — BARIUM SULFATE 98 % PO SUSR
80 mL | Freq: Once | ORAL | 0 refills | Status: CP
Start: 2017-02-03 — End: ?
  Administered 2017-02-03: 15:00:00 80 mL via ORAL

## 2017-02-03 MED ORDER — BARIUM SULFATE 40 % (W/V) PO SUSP
60 mL | Freq: Once | ORAL | 0 refills | Status: CP
Start: 2017-02-03 — End: ?
  Administered 2017-02-03: 15:00:00 60 mL via ORAL

## 2017-02-03 MED ORDER — IOHEXOL 350 MG IODINE/ML IV SOLN
70 mL | Freq: Once | INTRAVENOUS | 0 refills | Status: CP
Start: 2017-02-03 — End: ?
  Administered 2017-02-03: 21:00:00 70 mL via INTRAVENOUS

## 2017-02-03 MED ORDER — SODIUM CHLORIDE 0.9 % IJ SOLN
50 mL | Freq: Once | INTRAVENOUS | 0 refills | Status: CP
Start: 2017-02-03 — End: ?
  Administered 2017-02-03: 21:00:00 50 mL via INTRAVENOUS

## 2017-02-04 ENCOUNTER — Inpatient Hospital Stay: Admit: 2017-02-03 | Discharge: 2017-02-04

## 2017-02-04 ENCOUNTER — Inpatient Hospital Stay: Admit: 2017-02-04 | Discharge: 2017-02-04

## 2017-02-04 ENCOUNTER — Ambulatory Visit: Admit: 2017-02-04 | Discharge: 2017-02-04

## 2017-02-04 ENCOUNTER — Inpatient Hospital Stay: Admit: 2017-02-03 | Discharge: 2017-02-03

## 2017-02-04 ENCOUNTER — Encounter: Admit: 2017-02-02 | Discharge: 2017-02-02

## 2017-02-04 ENCOUNTER — Encounter: Admit: 2017-02-04 | Discharge: 2017-02-04

## 2017-02-04 ENCOUNTER — Inpatient Hospital Stay: Admit: 2017-02-02 | Discharge: 2017-02-02

## 2017-02-04 DIAGNOSIS — N2889 Other specified disorders of kidney and ureter: Secondary | ICD-10-CM

## 2017-02-04 DIAGNOSIS — E785 Hyperlipidemia, unspecified: ICD-10-CM

## 2017-02-04 DIAGNOSIS — R1310 Dysphagia, unspecified: ICD-10-CM

## 2017-02-04 DIAGNOSIS — Z79899 Other long term (current) drug therapy: ICD-10-CM

## 2017-02-04 DIAGNOSIS — F17200 Nicotine dependence, unspecified, uncomplicated: ICD-10-CM

## 2017-02-04 DIAGNOSIS — Z9049 Acquired absence of other specified parts of digestive tract: ICD-10-CM

## 2017-02-04 DIAGNOSIS — Z66 Do not resuscitate: ICD-10-CM

## 2017-02-04 DIAGNOSIS — Z21 Asymptomatic human immunodeficiency virus [HIV] infection status: ICD-10-CM

## 2017-02-04 DIAGNOSIS — K5732 Diverticulitis of large intestine without perforation or abscess without bleeding: Principal | ICD-10-CM

## 2017-02-04 DIAGNOSIS — R000 Tachycardia, unspecified: ICD-10-CM

## 2017-02-04 DIAGNOSIS — R079 Chest pain, unspecified: ICD-10-CM

## 2017-02-04 LAB — CBC AND DIFF: Lab: 5 K/UL — ABNORMAL LOW (ref 4.5–11.0)

## 2017-02-04 LAB — COMPREHENSIVE METABOLIC PANEL: Lab: 138 MMOL/L — ABNORMAL LOW (ref >60)

## 2017-02-04 MED ORDER — METRONIDAZOLE 500 MG PO TAB
500 mg | ORAL_TABLET | Freq: Three times a day (TID) | ORAL | 0 refills | Status: AC
Start: 2017-02-04 — End: ?
  Filled 2017-02-04 (×2): qty 30, 10d supply, fill #1

## 2017-02-04 MED ORDER — PROPOFOL 10 MG/ML IV EMUL 20 ML (INFUSION)(AM)(OR)
INTRAVENOUS | 0 refills | Status: DC
Start: 2017-02-04 — End: 2017-02-04
  Administered 2017-02-04: 16:00:00 150 ug/kg/min via INTRAVENOUS

## 2017-02-04 MED ORDER — LIDOCAINE (PF) 200 MG/10 ML (2 %) IJ SYRG
0 refills | Status: DC
Start: 2017-02-04 — End: 2017-02-04
  Administered 2017-02-04: 16:00:00 60 mg via INTRAVENOUS

## 2017-02-04 MED ORDER — CIPROFLOXACIN HCL 500 MG PO TAB
500 mg | ORAL_TABLET | Freq: Two times a day (BID) | ORAL | 0 refills | 10.00000 days | Status: AC
Start: 2017-02-04 — End: ?
  Filled 2017-02-04 (×2): qty 20, 10d supply, fill #1

## 2017-02-04 MED ORDER — CIPROFLOXACIN HCL 500 MG PO TAB
500 mg | Freq: Once | ORAL | 0 refills | Status: CP
Start: 2017-02-04 — End: ?
  Administered 2017-02-04: 20:00:00 500 mg via ORAL

## 2017-02-04 MED ORDER — SODIUM CHLORIDE 0.9 % IV SOLP (OR) 500ML
0 refills | Status: DC
Start: 2017-02-04 — End: 2017-02-04
  Administered 2017-02-04: 16:00:00 via INTRAVENOUS

## 2017-02-04 MED ORDER — PROPOFOL INJ 10 MG/ML IV VIAL
0 refills | Status: DC
Start: 2017-02-04 — End: 2017-02-04
  Administered 2017-02-04: 16:00:00 30 mg via INTRAVENOUS
  Administered 2017-02-04: 17:00:00 40 mg via INTRAVENOUS
  Administered 2017-02-04: 17:00:00 20 mg via INTRAVENOUS
  Administered 2017-02-04: 16:00:00 50 mg via INTRAVENOUS
  Administered 2017-02-04: 17:00:00 30 mg via INTRAVENOUS

## 2017-02-06 ENCOUNTER — Encounter: Admit: 2017-02-06 | Discharge: 2017-02-06

## 2017-02-08 ENCOUNTER — Encounter: Admit: 2017-02-08 | Discharge: 2017-02-08

## 2017-02-08 LAB — CULTURE-BLOOD W/SENSITIVITY

## 2017-02-09 LAB — CULTURE-BLOOD W/SENSITIVITY

## 2017-02-14 ENCOUNTER — Encounter: Admit: 2017-02-14 | Discharge: 2017-02-14

## 2017-02-14 MED ORDER — TRIUMEQ 600-50-300 MG PO TAB
ORAL_TABLET | Freq: Every day | 11 refills | Status: AC
Start: 2017-02-14 — End: 2018-02-21

## 2017-02-22 ENCOUNTER — Encounter: Admit: 2017-02-22 | Discharge: 2017-02-22

## 2017-02-22 ENCOUNTER — Ambulatory Visit: Admit: 2017-02-22 | Discharge: 2017-02-22 | Payer: No Typology Code available for payment source

## 2017-02-22 DIAGNOSIS — C642 Malignant neoplasm of left kidney, except renal pelvis: Principal | ICD-10-CM

## 2017-02-22 DIAGNOSIS — E785 Hyperlipidemia, unspecified: ICD-10-CM

## 2017-02-22 DIAGNOSIS — J40 Bronchitis, not specified as acute or chronic: ICD-10-CM

## 2017-02-22 DIAGNOSIS — B20 Human immunodeficiency virus [HIV] disease: ICD-10-CM

## 2017-02-22 DIAGNOSIS — T1491XA Suicide attempt, initial encounter: ICD-10-CM

## 2017-02-22 DIAGNOSIS — N2889 Other specified disorders of kidney and ureter: Principal | ICD-10-CM

## 2017-02-22 DIAGNOSIS — S129XXA Fracture of neck, unspecified, initial encounter: Principal | ICD-10-CM

## 2017-02-22 DIAGNOSIS — L409 Psoriasis, unspecified: ICD-10-CM

## 2017-02-22 DIAGNOSIS — A64 Unspecified sexually transmitted disease: ICD-10-CM

## 2017-02-22 MED ORDER — CEFAZOLIN INJ 1GM IVP
2 g | Freq: Once | INTRAVENOUS | 0 refills | Status: CN
Start: 2017-02-22 — End: ?

## 2017-03-30 ENCOUNTER — Encounter: Admit: 2017-03-30 | Discharge: 2017-03-30

## 2017-04-06 ENCOUNTER — Encounter: Admit: 2017-04-06 | Discharge: 2017-04-06 | Payer: No Typology Code available for payment source

## 2017-04-06 ENCOUNTER — Ambulatory Visit: Admit: 2017-04-06 | Discharge: 2017-04-06 | Payer: No Typology Code available for payment source

## 2017-04-06 ENCOUNTER — Encounter: Admit: 2017-04-06 | Discharge: 2017-04-06

## 2017-04-06 DIAGNOSIS — B20 Human immunodeficiency virus [HIV] disease: Principal | ICD-10-CM

## 2017-04-06 DIAGNOSIS — J40 Bronchitis, not specified as acute or chronic: ICD-10-CM

## 2017-04-06 DIAGNOSIS — Z202 Contact with and (suspected) exposure to infections with a predominantly sexual mode of transmission: Principal | ICD-10-CM

## 2017-04-06 DIAGNOSIS — L409 Psoriasis, unspecified: ICD-10-CM

## 2017-04-06 DIAGNOSIS — A64 Unspecified sexually transmitted disease: ICD-10-CM

## 2017-04-06 DIAGNOSIS — E785 Hyperlipidemia, unspecified: ICD-10-CM

## 2017-04-06 DIAGNOSIS — S129XXA Fracture of neck, unspecified, initial encounter: Principal | ICD-10-CM

## 2017-04-06 DIAGNOSIS — T1491XA Suicide attempt, initial encounter: ICD-10-CM

## 2017-04-06 LAB — CHLAM/NG PCR SWAB
Lab: NEGATIVE
Lab: NEGATIVE
Lab: NEGATIVE

## 2017-04-13 ENCOUNTER — Encounter: Admit: 2017-04-13 | Discharge: 2017-04-13

## 2017-04-13 DIAGNOSIS — E785 Hyperlipidemia, unspecified: ICD-10-CM

## 2017-04-13 DIAGNOSIS — J40 Bronchitis, not specified as acute or chronic: ICD-10-CM

## 2017-04-13 DIAGNOSIS — A64 Unspecified sexually transmitted disease: ICD-10-CM

## 2017-04-13 DIAGNOSIS — S129XXA Fracture of neck, unspecified, initial encounter: Principal | ICD-10-CM

## 2017-04-13 DIAGNOSIS — T1491XA Suicide attempt, initial encounter: ICD-10-CM

## 2017-04-13 DIAGNOSIS — M25511 Pain in right shoulder: Principal | ICD-10-CM

## 2017-04-13 DIAGNOSIS — B20 Human immunodeficiency virus [HIV] disease: ICD-10-CM

## 2017-04-13 DIAGNOSIS — L409 Psoriasis, unspecified: ICD-10-CM

## 2017-04-19 ENCOUNTER — Encounter: Admit: 2017-04-19 | Discharge: 2017-04-19

## 2017-04-21 ENCOUNTER — Encounter: Admit: 2017-04-21 | Discharge: 2017-04-21

## 2017-04-21 DIAGNOSIS — M25511 Pain in right shoulder: Principal | ICD-10-CM

## 2017-04-21 DIAGNOSIS — J40 Bronchitis, not specified as acute or chronic: ICD-10-CM

## 2017-04-21 DIAGNOSIS — A64 Unspecified sexually transmitted disease: ICD-10-CM

## 2017-04-21 DIAGNOSIS — L409 Psoriasis, unspecified: ICD-10-CM

## 2017-04-21 DIAGNOSIS — S129XXA Fracture of neck, unspecified, initial encounter: Principal | ICD-10-CM

## 2017-04-21 DIAGNOSIS — B20 Human immunodeficiency virus [HIV] disease: ICD-10-CM

## 2017-04-21 DIAGNOSIS — T1491XA Suicide attempt, initial encounter: ICD-10-CM

## 2017-04-21 DIAGNOSIS — E785 Hyperlipidemia, unspecified: ICD-10-CM

## 2017-04-27 ENCOUNTER — Ambulatory Visit: Admit: 2017-04-27 | Discharge: 2017-04-27

## 2017-04-27 ENCOUNTER — Encounter: Admit: 2017-04-27 | Discharge: 2017-04-27

## 2017-04-27 ENCOUNTER — Ambulatory Visit: Admit: 2017-04-27 | Discharge: 2017-04-27 | Payer: No Typology Code available for payment source

## 2017-04-27 DIAGNOSIS — L409 Psoriasis, unspecified: ICD-10-CM

## 2017-04-27 DIAGNOSIS — J40 Bronchitis, not specified as acute or chronic: ICD-10-CM

## 2017-04-27 DIAGNOSIS — M7551 Bursitis of right shoulder: Principal | ICD-10-CM

## 2017-04-27 DIAGNOSIS — M25511 Pain in right shoulder: ICD-10-CM

## 2017-04-27 DIAGNOSIS — E785 Hyperlipidemia, unspecified: ICD-10-CM

## 2017-04-27 DIAGNOSIS — A64 Unspecified sexually transmitted disease: ICD-10-CM

## 2017-04-27 DIAGNOSIS — S129XXA Fracture of neck, unspecified, initial encounter: Principal | ICD-10-CM

## 2017-04-27 DIAGNOSIS — M19011 Primary osteoarthritis, right shoulder: ICD-10-CM

## 2017-04-27 DIAGNOSIS — T1491XA Suicide attempt, initial encounter: ICD-10-CM

## 2017-04-27 DIAGNOSIS — B20 Human immunodeficiency virus [HIV] disease: ICD-10-CM

## 2017-04-27 MED ORDER — ROPIVACAINE (PF) 5 MG/ML (0.5 %) IJ SOLN
4 mL | Freq: Once | INTRAMUSCULAR | 0 refills | Status: CP | PRN
Start: 2017-04-27 — End: ?
  Administered 2017-04-27: 16:00:00 4 mL via INTRAMUSCULAR

## 2017-04-27 MED ORDER — TRIAMCINOLONE ACETONIDE 40 MG/ML IJ SUSP
80 mg | Freq: Once | INTRAMUSCULAR | 0 refills | Status: CP | PRN
Start: 2017-04-27 — End: ?
  Administered 2017-04-27: 16:00:00 80 mg via INTRAMUSCULAR

## 2017-05-19 ENCOUNTER — Encounter: Admit: 2017-05-19 | Discharge: 2017-05-19

## 2017-05-26 ENCOUNTER — Encounter: Admit: 2017-05-26 | Discharge: 2017-05-26

## 2017-06-09 ENCOUNTER — Encounter: Admit: 2017-06-09 | Discharge: 2017-06-10 | Payer: No Typology Code available for payment source

## 2017-06-09 ENCOUNTER — Encounter: Admit: 2017-06-09 | Discharge: 2017-06-09

## 2017-06-09 LAB — BASIC METABOLIC PANEL
Lab: 108 MMOL/L (ref 98–110)
Lab: 139 MMOL/L — ABNORMAL LOW (ref 137–147)
Lab: 24 MMOL/L (ref 21–30)
Lab: 4.2 MMOL/L (ref 3.5–5.1)
Lab: 7 pg (ref 3–12)

## 2017-06-09 LAB — CBC: Lab: 11 10*3/uL — ABNORMAL HIGH (ref 4.5–11.0)

## 2017-06-09 MED ORDER — FENTANYL CITRATE (PF) 50 MCG/ML IJ SOLN
25-50 ug | INTRAVENOUS | 0 refills | Status: DC | PRN
Start: 2017-06-09 — End: 2017-06-10
  Administered 2017-06-09 (×2): 50 ug via INTRAVENOUS
  Administered 2017-06-10: 01:00:00 25 ug via INTRAVENOUS

## 2017-06-09 MED ORDER — FENTANYL CITRATE (PF) 50 MCG/ML IJ SOLN
50 ug | INTRAVENOUS | 0 refills | Status: DC | PRN
Start: 2017-06-09 — End: 2017-06-09
  Administered 2017-06-09: 17:00:00 25 ug via INTRAVENOUS
  Administered 2017-06-09: 17:00:00 50 ug via INTRAVENOUS
  Administered 2017-06-09: 17:00:00 25 ug via INTRAVENOUS

## 2017-06-09 MED ORDER — ABACAVIR SULFATE/DOLUTEGRAVIR SULFATE/LAMIVUDINE 600/50/300 MG TAB
Freq: Every day | ORAL | 0 refills | Status: DC
Start: 2017-06-09 — End: 2017-06-10
  Administered 2017-06-10 (×3): via ORAL

## 2017-06-09 MED ORDER — PROPOFOL INJ 10 MG/ML IV VIAL
0 refills | Status: DC
Start: 2017-06-09 — End: 2017-06-09
  Administered 2017-06-09: 12:00:00 150 mg via INTRAVENOUS

## 2017-06-09 MED ORDER — ACETAMINOPHEN 325 MG PO TAB
650 mg | ORAL | 0 refills | Status: DC
Start: 2017-06-09 — End: 2017-06-10
  Administered 2017-06-09 – 2017-06-10 (×3): 650 mg via ORAL

## 2017-06-09 MED ORDER — MIDAZOLAM 1 MG/ML IJ SOLN
INTRAVENOUS | 0 refills | Status: DC
Start: 2017-06-09 — End: 2017-06-09
  Administered 2017-06-09: 12:00:00 2 mg via INTRAVENOUS

## 2017-06-09 MED ORDER — OXYCODONE 5 MG PO TAB
5-10 mg | Freq: Once | ORAL | 0 refills | Status: CP | PRN
Start: 2017-06-09 — End: ?
  Administered 2017-06-09: 17:00:00 10 mg via ORAL

## 2017-06-09 MED ORDER — LIDOCAINE (PF) 10 MG/ML (1 %) IJ SOLN
.1-2 mL | INTRAMUSCULAR | 0 refills | Status: DC | PRN
Start: 2017-06-09 — End: 2017-06-09

## 2017-06-09 MED ORDER — ELECTROLYTE-A IV SOLP
0 refills | Status: DC
Start: 2017-06-09 — End: 2017-06-09
  Administered 2017-06-09: 13:00:00 via INTRAVENOUS

## 2017-06-09 MED ORDER — POLYETHYLENE GLYCOL 3350 17 GRAM PO PWPK
1 | Freq: Two times a day (BID) | ORAL | 0 refills | Status: DC
Start: 2017-06-09 — End: 2017-06-10

## 2017-06-09 MED ORDER — SENNOSIDES-DOCUSATE SODIUM 8.6-50 MG PO TAB
1 | Freq: Two times a day (BID) | ORAL | 0 refills | Status: DC
Start: 2017-06-09 — End: 2017-06-10

## 2017-06-09 MED ORDER — CEFAZOLIN INJ 1GM IVP
2 g | INTRAVENOUS | 0 refills | Status: CP
Start: 2017-06-09 — End: ?
  Administered 2017-06-09 – 2017-06-10 (×3): 2 g via INTRAVENOUS

## 2017-06-09 MED ORDER — HYOSCYAMINE SULFATE 0.125 MG PO TBDI
.125 mg | SUBLINGUAL | 0 refills | Status: DC | PRN
Start: 2017-06-09 — End: 2017-06-10
  Administered 2017-06-09: 23:00:00 0.125 mg via SUBLINGUAL

## 2017-06-09 MED ORDER — DEXAMETHASONE SODIUM PHOS (PF) 10 MG/ML IJ SOLN
4 mg | Freq: Once | INTRAVENOUS | 0 refills | Status: DC | PRN
Start: 2017-06-09 — End: 2017-06-09

## 2017-06-09 MED ORDER — DEXAMETHASONE SODIUM PHOSPHATE 4 MG/ML IJ SOLN
INTRAVENOUS | 0 refills | Status: DC
Start: 2017-06-09 — End: 2017-06-09
  Administered 2017-06-09: 13:00:00 4 mg via INTRAVENOUS

## 2017-06-09 MED ORDER — OXYCODONE 5 MG PO TAB
5-10 mg | ORAL | 0 refills | Status: DC | PRN
Start: 2017-06-09 — End: 2017-06-10
  Administered 2017-06-09 – 2017-06-10 (×2): 10 mg via ORAL

## 2017-06-09 MED ORDER — LIDOCAINE (PF) 200 MG/10 ML (2 %) IJ SYRG
0 refills | Status: DC
Start: 2017-06-09 — End: 2017-06-09
  Administered 2017-06-09: 12:00:00 80 mg via INTRAVENOUS

## 2017-06-09 MED ORDER — ROCURONIUM 10 MG/ML IV SOLN
INTRAVENOUS | 0 refills | Status: DC
Start: 2017-06-09 — End: 2017-06-09
  Administered 2017-06-09 (×2): 10 mg via INTRAVENOUS
  Administered 2017-06-09: 16:00:00 5 mg via INTRAVENOUS
  Administered 2017-06-09 (×3): 10 mg via INTRAVENOUS
  Administered 2017-06-09: 14:00:00 20 mg via INTRAVENOUS
  Administered 2017-06-09 (×3): 10 mg via INTRAVENOUS
  Administered 2017-06-09: 16:00:00 5 mg via INTRAVENOUS
  Administered 2017-06-09: 13:00:00 10 mg via INTRAVENOUS
  Administered 2017-06-09: 12:00:00 40 mg via INTRAVENOUS
  Administered 2017-06-09: 15:00:00 10 mg via INTRAVENOUS

## 2017-06-09 MED ORDER — DEXMEDETOMIDINE# 4MCG/ML IV SOLN
0 refills | Status: DC
Start: 2017-06-09 — End: 2017-06-09
  Administered 2017-06-09: 16:00:00 12 ug via INTRAVENOUS
  Administered 2017-06-09: 16:00:00 8 ug via INTRAVENOUS

## 2017-06-09 MED ORDER — FENTANYL CITRATE (PF) 50 MCG/ML IJ SOLN
0 refills | Status: DC
Start: 2017-06-09 — End: 2017-06-09
  Administered 2017-06-09 (×6): 50 ug via INTRAVENOUS

## 2017-06-09 MED ORDER — PHENYLEPHRINE IN 0.9% NACL(PF) 1 MG/10 ML (100 MCG/ML) IV SYRG
INTRAVENOUS | 0 refills | Status: DC
Start: 2017-06-09 — End: 2017-06-09
  Administered 2017-06-09 (×11): 100 ug via INTRAVENOUS

## 2017-06-09 MED ORDER — BUPIVACAINE-EPINEPHRINE 0.25 %-1:200,000 IJ SOLN
0 refills | Status: DC
Start: 2017-06-09 — End: 2017-06-09
  Administered 2017-06-09: 16:00:00 50 mL via INTRAMUSCULAR

## 2017-06-09 MED ORDER — ONDANSETRON HCL (PF) 4 MG/2 ML IJ SOLN
4 mg | INTRAVENOUS | 0 refills | Status: DC | PRN
Start: 2017-06-09 — End: 2017-06-10
  Administered 2017-06-09: 22:00:00 4 mg via INTRAVENOUS

## 2017-06-09 MED ORDER — SODIUM CHLORIDE 0.9 % IV SOLP
INTRAVENOUS | 0 refills | Status: DC
Start: 2017-06-09 — End: 2017-06-10
  Administered 2017-06-09: 19:00:00 1000.000 mL via INTRAVENOUS

## 2017-06-09 MED ORDER — ONDANSETRON HCL (PF) 4 MG/2 ML IJ SOLN
INTRAVENOUS | 0 refills | Status: DC
Start: 2017-06-09 — End: 2017-06-09
  Administered 2017-06-09: 16:00:00 4 mg via INTRAVENOUS

## 2017-06-09 MED ORDER — CEFAZOLIN INJ 1GM IVP
2 g | Freq: Once | INTRAVENOUS | 0 refills | Status: CP
Start: 2017-06-09 — End: ?
  Administered 2017-06-09: 13:00:00 2 g via INTRAVENOUS

## 2017-06-09 MED ORDER — SODIUM CHLORIDE 0.9 % IV SOLP
0 refills | Status: DC
Start: 2017-06-09 — End: 2017-06-09
  Administered 2017-06-09: 15:00:00 via INTRAVENOUS

## 2017-06-09 MED ORDER — SUGAMMADEX 100 MG/ML IV SOLN
INTRAVENOUS | 0 refills | Status: DC
Start: 2017-06-09 — End: 2017-06-09
  Administered 2017-06-09: 16:00:00 130 mg via INTRAVENOUS

## 2017-06-09 MED ORDER — LACTATED RINGERS IV SOLP
1000 mL | INTRAVENOUS | 0 refills | Status: DC
Start: 2017-06-09 — End: 2017-06-10

## 2017-06-09 MED ORDER — DEXTRAN 70-HYPROMELLOSE (PF) 0.1-0.3 % OP DPET
0 refills | Status: DC
Start: 2017-06-09 — End: 2017-06-09
  Administered 2017-06-09: 12:00:00 2 [drp] via OPHTHALMIC

## 2017-06-09 MED ADMIN — LACTATED RINGERS IV SOLP [4318]: 1000 mL | INTRAVENOUS | @ 11:00:00 | Stop: 2017-06-09 | NDC 00338011704

## 2017-06-10 ENCOUNTER — Encounter: Admit: 2017-06-10 | Discharge: 2017-06-10

## 2017-06-10 ENCOUNTER — Inpatient Hospital Stay: Admit: 2017-06-09 | Discharge: 2017-06-09

## 2017-06-10 ENCOUNTER — Inpatient Hospital Stay: Admit: 2017-06-08 | Discharge: 2017-06-08

## 2017-06-10 DIAGNOSIS — Z915 Personal history of self-harm: ICD-10-CM

## 2017-06-10 DIAGNOSIS — Z7983 Long term (current) use of bisphosphonates: ICD-10-CM

## 2017-06-10 DIAGNOSIS — E785 Hyperlipidemia, unspecified: ICD-10-CM

## 2017-06-10 DIAGNOSIS — L409 Psoriasis, unspecified: ICD-10-CM

## 2017-06-10 DIAGNOSIS — Z882 Allergy status to sulfonamides status: ICD-10-CM

## 2017-06-10 DIAGNOSIS — C642 Malignant neoplasm of left kidney, except renal pelvis: Principal | ICD-10-CM

## 2017-06-10 DIAGNOSIS — B20 Human immunodeficiency virus [HIV] disease: ICD-10-CM

## 2017-06-10 DIAGNOSIS — Z87891 Personal history of nicotine dependence: ICD-10-CM

## 2017-06-10 DIAGNOSIS — K5792 Diverticulitis of intestine, part unspecified, without perforation or abscess without bleeding: ICD-10-CM

## 2017-06-10 LAB — BASIC METABOLIC PANEL
Lab: 1 mg/dL (ref 0.4–1.24)
Lab: 10 mg/dL (ref 7–25)
Lab: 106 mg/dL — ABNORMAL HIGH (ref 70–100)
Lab: 107 MMOL/L — ABNORMAL LOW (ref 98–110)
Lab: 139 MMOL/L — ABNORMAL LOW (ref 137–147)
Lab: 26 MMOL/L (ref 21–30)
Lab: 3.9 MMOL/L — ABNORMAL LOW (ref 3.5–5.1)
Lab: 6 pg (ref 3–12)
Lab: 8.5 mg/dL (ref 8.5–10.6)
Lab: >60 mL/min (ref >60)
Lab: >60 mL/min (ref >60)

## 2017-06-10 LAB — CBC: Lab: 10 10*3/uL (ref 4.5–11.0)

## 2017-06-10 MED ORDER — OXYCODONE 5 MG PO TAB
5-10 mg | ORAL_TABLET | ORAL | 0 refills | 6.00000 days | Status: AC | PRN
Start: 2017-06-10 — End: 2017-07-19
  Filled 2017-06-10 (×2): qty 30, 3d supply, fill #1

## 2017-06-10 MED ORDER — ACETAMINOPHEN 325 MG PO TAB
650 mg | ORAL | 0 refills | Status: AC | PRN
Start: 2017-06-10 — End: 2017-07-19

## 2017-06-10 MED ORDER — POLYETHYLENE GLYCOL 3350 17 GRAM PO PWPK
17 g | Freq: Two times a day (BID) | ORAL | 0 refills | 18.00000 days | Status: AC
Start: 2017-06-10 — End: 2017-07-19

## 2017-06-10 MED ORDER — SENNOSIDES-DOCUSATE SODIUM 8.6-50 MG PO TAB
1 | ORAL_TABLET | Freq: Two times a day (BID) | ORAL | 0 refills | Status: AC
Start: 2017-06-10 — End: 2017-07-19

## 2017-06-11 ENCOUNTER — Encounter: Admit: 2017-06-11 | Discharge: 2017-06-11

## 2017-06-11 DIAGNOSIS — E785 Hyperlipidemia, unspecified: ICD-10-CM

## 2017-06-11 DIAGNOSIS — J40 Bronchitis, not specified as acute or chronic: ICD-10-CM

## 2017-06-11 DIAGNOSIS — T1491XA Suicide attempt, initial encounter: ICD-10-CM

## 2017-06-11 DIAGNOSIS — L409 Psoriasis, unspecified: ICD-10-CM

## 2017-06-11 DIAGNOSIS — S129XXA Fracture of neck, unspecified, initial encounter: Principal | ICD-10-CM

## 2017-06-11 DIAGNOSIS — A64 Unspecified sexually transmitted disease: ICD-10-CM

## 2017-06-11 DIAGNOSIS — B20 Human immunodeficiency virus [HIV] disease: ICD-10-CM

## 2017-06-24 ENCOUNTER — Encounter: Admit: 2017-06-24 | Discharge: 2017-06-24

## 2017-06-30 ENCOUNTER — Emergency Department
Admit: 2017-06-30 | Discharge: 2017-06-30 | Disposition: A | Discharge status: Home / Self Care | Payer: No Typology Code available for payment source

## 2017-06-30 ENCOUNTER — Emergency Department: Admit: 2017-06-30 | Discharge: 2017-06-30

## 2017-06-30 ENCOUNTER — Encounter: Admit: 2017-06-30 | Discharge: 2017-06-30

## 2017-06-30 DIAGNOSIS — R000 Tachycardia, unspecified: ICD-10-CM

## 2017-06-30 DIAGNOSIS — E785 Hyperlipidemia, unspecified: ICD-10-CM

## 2017-06-30 DIAGNOSIS — J40 Bronchitis, not specified as acute or chronic: ICD-10-CM

## 2017-06-30 DIAGNOSIS — S129XXA Fracture of neck, unspecified, initial encounter: Principal | ICD-10-CM

## 2017-06-30 DIAGNOSIS — L409 Psoriasis, unspecified: ICD-10-CM

## 2017-06-30 DIAGNOSIS — R39198 Other difficulties with micturition: ICD-10-CM

## 2017-06-30 DIAGNOSIS — A64 Unspecified sexually transmitted disease: ICD-10-CM

## 2017-06-30 DIAGNOSIS — M542 Cervicalgia: ICD-10-CM

## 2017-06-30 DIAGNOSIS — T1491XA Suicide attempt, initial encounter: ICD-10-CM

## 2017-06-30 DIAGNOSIS — R0602 Shortness of breath: ICD-10-CM

## 2017-06-30 DIAGNOSIS — Z87891 Personal history of nicotine dependence: ICD-10-CM

## 2017-06-30 DIAGNOSIS — R0789 Other chest pain: Secondary | ICD-10-CM

## 2017-06-30 DIAGNOSIS — B20 Human immunodeficiency virus [HIV] disease: ICD-10-CM

## 2017-06-30 LAB — COMPREHENSIVE METABOLIC PANEL
Lab: 136 MMOL/L — ABNORMAL LOW (ref >60)
Lab: 21 U/L (ref 7–40)
Lab: 27 MMOL/L — ABNORMAL HIGH (ref 21–30)
Lab: 4.3 MMOL/L (ref 3.5–5.1)
Lab: 68 U/L (ref 25–110)
Lab: 7.3 g/dL (ref 6.0–8.0)
Lab: 92 mg/dL (ref 70–100)

## 2017-06-30 LAB — URINALYSIS DIPSTICK
Lab: 1 /HPF (ref 1.003–1.035)
Lab: NEGATIVE
Lab: NEGATIVE
Lab: NEGATIVE
Lab: NEGATIVE
Lab: NEGATIVE
Lab: NEGATIVE

## 2017-06-30 LAB — URINALYSIS, MICROSCOPIC

## 2017-06-30 LAB — CBC AND DIFF
Lab: 0.1 10*3/uL (ref 0–0.20)
Lab: 0.1 10*3/uL (ref 0–0.45)
Lab: 0.8 10*3/uL (ref 0–0.80)
Lab: 1 % (ref 0–2)
Lab: 1.9 10*3/uL (ref 1.0–4.8)
Lab: 10 % (ref >60)
Lab: 2 % (ref 0–5)
Lab: 23 % — ABNORMAL LOW (ref >60)
Lab: 33 pg (ref 26–34)
Lab: 4.5 M/UL (ref 4.4–5.5)
Lab: 5.2 10*3/uL (ref 1.8–7.0)
Lab: 64 % (ref 41–77)
Lab: 7.3 FL (ref 7–11)
Lab: 8.1 10*3/uL (ref 4.5–11.0)
Lab: 96 FL (ref 80–100)

## 2017-06-30 LAB — BNP POC ER: Lab: 17 pg/mL (ref 0–100)

## 2017-06-30 LAB — D-DIMER: Lab: 382 ng{FEU}/mL (ref <500)

## 2017-06-30 LAB — LIPASE: Lab: 18 U/L (ref 11–82)

## 2017-06-30 LAB — POC GLUCOSE: Lab: 104 mg/dL — ABNORMAL HIGH (ref 70–100)

## 2017-06-30 LAB — POC TROPONIN: Lab: 0 ng/mL (ref 0.00–0.05)

## 2017-07-18 ENCOUNTER — Encounter: Admit: 2017-07-18 | Discharge: 2017-07-18

## 2017-07-19 ENCOUNTER — Encounter: Admit: 2017-07-19 | Discharge: 2017-07-19

## 2017-07-19 ENCOUNTER — Ambulatory Visit: Admit: 2017-07-19 | Discharge: 2017-07-20 | Payer: No Typology Code available for payment source

## 2017-07-19 DIAGNOSIS — L409 Psoriasis, unspecified: ICD-10-CM

## 2017-07-19 DIAGNOSIS — B20 Human immunodeficiency virus [HIV] disease: ICD-10-CM

## 2017-07-19 DIAGNOSIS — T1491XA Suicide attempt, initial encounter: ICD-10-CM

## 2017-07-19 DIAGNOSIS — E785 Hyperlipidemia, unspecified: ICD-10-CM

## 2017-07-19 DIAGNOSIS — A64 Unspecified sexually transmitted disease: ICD-10-CM

## 2017-07-19 DIAGNOSIS — S129XXA Fracture of neck, unspecified, initial encounter: Principal | ICD-10-CM

## 2017-07-19 DIAGNOSIS — J40 Bronchitis, not specified as acute or chronic: ICD-10-CM

## 2017-07-20 DIAGNOSIS — C642 Malignant neoplasm of left kidney, except renal pelvis: Principal | ICD-10-CM

## 2017-08-17 ENCOUNTER — Encounter: Admit: 2017-08-17 | Discharge: 2017-08-17

## 2017-10-05 ENCOUNTER — Encounter: Admit: 2017-10-05 | Discharge: 2017-10-05

## 2017-10-05 ENCOUNTER — Emergency Department
Admit: 2017-10-05 | Discharge: 2017-10-05 | Disposition: A | Discharge status: Home / Self Care | Payer: No Typology Code available for payment source

## 2017-10-05 DIAGNOSIS — A57 Chancroid: ICD-10-CM

## 2017-10-05 DIAGNOSIS — Z87891 Personal history of nicotine dependence: ICD-10-CM

## 2017-10-05 DIAGNOSIS — B20 Human immunodeficiency virus [HIV] disease: ICD-10-CM

## 2017-10-05 DIAGNOSIS — R590 Localized enlarged lymph nodes: Principal | ICD-10-CM

## 2017-10-05 DIAGNOSIS — D6859 Other primary thrombophilia: ICD-10-CM

## 2017-10-05 DIAGNOSIS — F1490 Cocaine use, unspecified, uncomplicated: ICD-10-CM

## 2017-10-05 LAB — CBC AND DIFF
Lab: 0.1 10*3/uL (ref 0–0.20)
Lab: 0.3 10*3/uL (ref 0–0.45)
Lab: 7.3 K/UL (ref 4.5–11.0)

## 2017-10-05 LAB — URINALYSIS DIPSTICK
Lab: NEGATIVE 10*3/uL (ref 3–12)
Lab: NEGATIVE MMOL/L (ref 21–30)
Lab: NEGATIVE U/L (ref 25–110)
Lab: NEGATIVE U/L (ref 7–56)
Lab: NEGATIVE g/dL (ref 3.5–5.0)
Lab: NEGATIVE g/dL — ABNORMAL HIGH (ref 6.0–8.0)
Lab: NEGATIVE mg/dL — ABNORMAL LOW (ref 0.3–1.2)

## 2017-10-05 LAB — COMPREHENSIVE METABOLIC PANEL
Lab: 137 MMOL/L — AB (ref 137–147)
Lab: 3.8 MMOL/L (ref 3.5–5.1)
Lab: >60 mL/min (ref >60)
Lab: >60 mL/min — ABNORMAL HIGH (ref >60)

## 2017-10-05 LAB — URINALYSIS, MICROSCOPIC

## 2017-10-05 MED ORDER — AZITHROMYCIN 250 MG PO TAB
1000 mg | Freq: Once | ORAL | 0 refills | Status: CP
Start: 2017-10-05 — End: ?
  Administered 2017-10-05: 22:00:00 1000 mg via ORAL

## 2017-10-06 LAB — CHLAM/NG PCR URINE
Lab: NEGATIVE
Lab: POSITIVE — AB

## 2017-10-07 ENCOUNTER — Encounter: Admit: 2017-10-07 | Discharge: 2017-10-07

## 2017-10-12 ENCOUNTER — Encounter: Admit: 2017-10-12 | Discharge: 2017-10-12

## 2017-10-24 ENCOUNTER — Encounter: Admit: 2017-10-24 | Discharge: 2017-10-24

## 2017-11-03 ENCOUNTER — Encounter: Admit: 2017-11-03 | Discharge: 2017-11-03

## 2017-12-02 ENCOUNTER — Encounter: Admit: 2017-12-02 | Discharge: 2017-12-02

## 2018-01-03 ENCOUNTER — Encounter: Admit: 2018-01-03 | Discharge: 2018-01-03

## 2018-01-06 ENCOUNTER — Ambulatory Visit: Admit: 2018-01-06 | Discharge: 2018-01-06 | Payer: No Typology Code available for payment source

## 2018-01-06 DIAGNOSIS — C642 Malignant neoplasm of left kidney, except renal pelvis: Principal | ICD-10-CM

## 2018-01-10 ENCOUNTER — Encounter: Admit: 2018-01-10 | Discharge: 2018-01-10

## 2018-01-10 ENCOUNTER — Ambulatory Visit: Admit: 2018-01-10 | Discharge: 2018-01-11 | Payer: No Typology Code available for payment source

## 2018-01-10 DIAGNOSIS — S129XXA Fracture of neck, unspecified, initial encounter: Principal | ICD-10-CM

## 2018-01-10 DIAGNOSIS — A64 Unspecified sexually transmitted disease: ICD-10-CM

## 2018-01-10 DIAGNOSIS — B20 Human immunodeficiency virus [HIV] disease: ICD-10-CM

## 2018-01-10 DIAGNOSIS — T1491XA Suicide attempt, initial encounter: ICD-10-CM

## 2018-01-10 DIAGNOSIS — E785 Hyperlipidemia, unspecified: ICD-10-CM

## 2018-01-10 DIAGNOSIS — L409 Psoriasis, unspecified: ICD-10-CM

## 2018-01-10 DIAGNOSIS — J40 Bronchitis, not specified as acute or chronic: ICD-10-CM

## 2018-01-10 MED ORDER — CEPHALEXIN 500 MG PO CAP
500 mg | ORAL_CAPSULE | Freq: Three times a day (TID) | ORAL | 0 refills | Status: AC
Start: 2018-01-10 — End: ?

## 2018-01-11 ENCOUNTER — Encounter: Admit: 2018-01-11 | Discharge: 2018-01-11

## 2018-01-11 DIAGNOSIS — A64 Unspecified sexually transmitted disease: ICD-10-CM

## 2018-01-11 DIAGNOSIS — L02214 Cutaneous abscess of groin: Principal | ICD-10-CM

## 2018-01-11 DIAGNOSIS — T1491XA Suicide attempt, initial encounter: ICD-10-CM

## 2018-01-11 DIAGNOSIS — B20 Human immunodeficiency virus [HIV] disease: ICD-10-CM

## 2018-01-11 DIAGNOSIS — E785 Hyperlipidemia, unspecified: ICD-10-CM

## 2018-01-11 DIAGNOSIS — C642 Malignant neoplasm of left kidney, except renal pelvis: ICD-10-CM

## 2018-01-11 DIAGNOSIS — J40 Bronchitis, not specified as acute or chronic: ICD-10-CM

## 2018-01-11 DIAGNOSIS — R590 Localized enlarged lymph nodes: ICD-10-CM

## 2018-01-11 DIAGNOSIS — L0291 Cutaneous abscess, unspecified: Secondary | ICD-10-CM

## 2018-01-11 DIAGNOSIS — S129XXA Fracture of neck, unspecified, initial encounter: Principal | ICD-10-CM

## 2018-01-11 DIAGNOSIS — L409 Psoriasis, unspecified: ICD-10-CM

## 2018-01-15 LAB — CULTURE-WOUND/TISSUE/FLUID(AEROBIC ONLY)W/SENSITIVITY

## 2018-01-27 ENCOUNTER — Encounter: Admit: 2018-01-27 | Discharge: 2018-01-27

## 2018-01-27 ENCOUNTER — Ambulatory Visit: Admit: 2018-01-27 | Discharge: 2018-01-28 | Payer: No Typology Code available for payment source

## 2018-01-27 DIAGNOSIS — T1491XA Suicide attempt, initial encounter: Secondary | ICD-10-CM

## 2018-01-27 DIAGNOSIS — L409 Psoriasis, unspecified: Secondary | ICD-10-CM

## 2018-01-27 DIAGNOSIS — E785 Hyperlipidemia, unspecified: Secondary | ICD-10-CM

## 2018-01-27 DIAGNOSIS — S129XXA Fracture of neck, unspecified, initial encounter: Secondary | ICD-10-CM

## 2018-01-27 DIAGNOSIS — A64 Unspecified sexually transmitted disease: Secondary | ICD-10-CM

## 2018-01-27 DIAGNOSIS — B20 Human immunodeficiency virus [HIV] disease: Secondary | ICD-10-CM

## 2018-01-27 DIAGNOSIS — J40 Bronchitis, not specified as acute or chronic: Secondary | ICD-10-CM

## 2018-01-28 DIAGNOSIS — R590 Localized enlarged lymph nodes: Secondary | ICD-10-CM

## 2018-02-06 ENCOUNTER — Encounter: Admit: 2018-02-06 | Discharge: 2018-02-06

## 2018-02-21 ENCOUNTER — Encounter: Admit: 2018-02-21 | Discharge: 2018-02-21

## 2018-02-21 MED ORDER — TRIUMEQ 600-50-300 MG PO TAB
ORAL_TABLET | Freq: Every day | 3 refills
Start: 2018-02-21 — End: ?

## 2018-02-21 MED ORDER — TRIUMEQ 600-50-300 MG PO TAB
ORAL_TABLET | Freq: Every day | 3 refills | Status: AC
Start: 2018-02-21 — End: 2018-06-13

## 2018-03-16 ENCOUNTER — Encounter: Admit: 2018-03-16 | Discharge: 2018-03-16

## 2018-04-03 ENCOUNTER — Encounter: Admit: 2018-04-03 | Discharge: 2018-04-03

## 2018-04-03 NOTE — Telephone Encounter
Raynald called back with [employer] number of [phone].

## 2018-04-03 NOTE — Telephone Encounter
Attorney's fax: [phone]

## 2018-04-05 ENCOUNTER — Encounter: Admit: 2018-04-05 | Discharge: 2018-04-05

## 2018-04-12 ENCOUNTER — Encounter: Admit: 2018-04-12 | Discharge: 2018-04-12

## 2018-04-12 DIAGNOSIS — Z008 Encounter for other general examination: Principal | ICD-10-CM

## 2018-04-12 DIAGNOSIS — B20 Human immunodeficiency virus [HIV] disease: Principal | ICD-10-CM

## 2018-04-12 NOTE — Telephone Encounter
Periodic Patient Reassessment: Antiretroviral Therapy         Summary of Visit    Charles Walls continues on Triumeq with excellent reported adherence.    He does not miss doses.    He denies any current use of OTC/herbal/other prescription medications although reports he may get started on another med or two by another doctor--note patient has requested referral to psychiatry.    Noted patient has not had HIV related labs since 11/24/16?  Routing to Dr. Colon Flattery to determine what labs he would like patient to have drawn.    Indication / Regimen     abacavir 600mg /lamivudine 300mg /dolutegravir 50mg  (Triumeq) - one tablet by mouth once daily     HIV Regimen: The patient is on a single tablet regimen.     Charles Walls takes their medication in the morning w/ breakfast.    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 It is planned to continue indefinitely which is appropriate for Charles Walls. No renal or hepatic adjustments are required.    Patient assessments:   Subjective clinical assessment: on a scale of 1 to 10, the patient rates they are feeling 10 out of 10 while on treatment.    Subjective Quality of Life Measurement: Charles Walls was able to complete all normal daily activities over the past 30 days.    Response to HIV antiretroviral therapy:      Laboratory studies will be collected at next scheduled appointment to determine HIV virologic and immunologic response.        Labs:   CD4 Count   Date/Time Value Ref Range Status   11/24/2016 09:46 AM 1,283 440 - 2,160 Final          HIV Viral Load PCR   Date/Time Value Ref Range Status   11/24/2016 09:46 AM HIV-1 RNA Not Detected, <40 copies/mL <40 RNA COPIES/ML Final     Comment:     The test method detects HIV-1 viral load using the Abbott RealTime assay.    Please correlate results with the clinical status of the patient. / herbal products, or vitamins.    Drug-Drug Interactions     No new significant drug-drug or drug-food interactions were identified.     Drug-Food Interactions    Drug-food interactions were evaluated and there are no significant drug-food interactions.    This medication can be taken with or without food.    Vaccination Status Assessment     Immunization History   Administered Date(s) Administered   ??? Flu Vaccine =>65 YO High-Dose (PF) 02/11/2016, 11/24/2016   ??? Flu Vaccine Trivalent =>3 Yo (Preservative Free) 10/04/2013   ??? Hepatitis B Vaccine Adult 3 Dose IM 10/01/2013   ??? Meningococcal Conjug Vaccine IM (MenACWY-CRM)(Menveo) 06/18/2015, 02/11/2016   ??? Pneumococcal Vaccine (23-Val Adult) 10/01/2013   ??? Pneumococcal Vaccine(13-Val Peds/immunocompromised adult) 07/31/2013   ??? Tdap Vaccine 05/11/2013       Vaccine history was reviewed with the patient. The patient was reminded about the importance of receiving an annual influenza vaccine as indicated.     Depression screening    Patient has been appropriately screened by healthcare team    Risk Evaluation and Mitigation Strategy (REMS) Assessment    No REMS is required for this medication.     Medication Education    Charles Walls was provided with drug information for their HIV medications.  Discussion with the patient  included: the medication name, regimen, dosing, frequency, duration, proper administration, storage, monitoring, common side effects, safety precautions, and food/drug interactions to be aware of.  The patient was instructed to speak to ID clinic doctor or pharmacist prior to starting any new drug--including prescription or OTC, natural products, or vitamins.  Emphasis was placed on the importance of medication compliance.  The patient was instructed to seek medical attention immediately if experiencing signs of an allergic reaction, including: rash, hives, itching; red, swollen, blistered, or peeling skin with or without fever.  The patient was also encouraged to contact the pharmacist with any further questions. Patient verbalized acceptance and understanding.     Additional Information    Outpatient Pharmacy: [facility] for ART  Insurance: [employer], ADAP (04/03/18 had planned appt w/ his case mgr as he was moving from [place] to [place])   [employer] Program involvement: Anthoney Harada at [facility] is his case mgr    HIV Retention of Care: The patient does not have a follow-up appointment.     Re-assessment has been completed and the patient will be re-assessed annually.      Charles Walls, PharmD, BCPS, AAHIVP  Phone [phone]

## 2018-04-12 NOTE — Telephone Encounter
Patient phoned back, states that he feels like he is going through a lot with his divorce. Preferably male psychiatrist.    Ames Coupe to provider for referral review.

## 2018-04-13 NOTE — Telephone Encounter
Left message for return call.

## 2018-04-14 ENCOUNTER — Encounter: Admit: 2018-04-14 | Discharge: 2018-04-14

## 2018-06-13 ENCOUNTER — Encounter: Admit: 2018-06-13 | Discharge: 2018-06-13

## 2018-06-13 MED ORDER — TRIUMEQ 600-50-300 MG PO TAB
ORAL_TABLET | Freq: Every day | 1 refills | Status: DC
Start: 2018-06-13 — End: 2018-08-28

## 2018-06-13 NOTE — Telephone Encounter
Called and spoke with Manjot to let him know he needed labs drawn ASAP. He said he would stop and get them drawn on the way home.

## 2018-07-06 ENCOUNTER — Encounter: Admit: 2018-07-06 | Discharge: 2018-07-06

## 2018-07-06 DIAGNOSIS — R310 Gross hematuria: Secondary | ICD-10-CM

## 2018-07-06 NOTE — Telephone Encounter
Returned patient's phone call. Having intermittent left lower back pain and decreased urine volume. No nausea, vomiting, fevers, chills, dysuria, frequency, urgency. Had an episode of gross hematuria today. Informed patient he may be passing a kidney stone. No prior history.     Also reports prior site of inguinal I&D by Dr. Irineo Axon has started to "swell" again and is concerned about this.    Discussed workup of gross hematuria with patient including CT urogram and cystoscopy. CT will also aid in examining inguinal region for possible abscess. He is amenable.     Plan:  - CT urogram and cysto next available  - Strict ED return precautions provided.

## 2018-07-06 NOTE — Telephone Encounter
Patient called and left information to return his call. Called back patient to find out his concerns. Patient states he has had stabbing pain on his back side above his butt cheek that started 2 days ago. He also had blood in his urine x1 occurrence today and he is worried. It was dark pink in color. Denies any additional symptoms such as fever, burning with urination, swelling or scrotal pain. Let patient know I could send along a message to our team of residents for additional recommendations. Stated understanding, no further questions at this time.

## 2018-07-10 ENCOUNTER — Ambulatory Visit: Admit: 2018-07-10 | Discharge: 2018-07-10

## 2018-07-10 ENCOUNTER — Encounter: Admit: 2018-07-10 | Discharge: 2018-07-10

## 2018-07-10 DIAGNOSIS — C642 Malignant neoplasm of left kidney, except renal pelvis: Secondary | ICD-10-CM

## 2018-07-10 DIAGNOSIS — B20 Human immunodeficiency virus [HIV] disease: Secondary | ICD-10-CM

## 2018-07-10 LAB — CBC AND DIFF
Lab: 0.1 10*3/uL (ref 0–0.20)
Lab: 0.6 10*3/uL (ref 0–0.80)
Lab: 2 % (ref 0–2)
Lab: 2.4 10*3/uL (ref 1.0–4.8)
Lab: 2.9 10*3/uL (ref 1.8–7.0)
Lab: 3 % (ref 0–5)
Lab: 34 g/dL (ref 32.0–36.0)
Lab: 4.8 M/UL (ref 4.4–5.5)
Lab: 43 % (ref 40–50)
Lab: 6.3 K/UL (ref 4.5–11.0)

## 2018-07-10 LAB — COMPREHENSIVE METABOLIC PANEL
Lab: 103 MMOL/L (ref 98–110)
Lab: 141 MMOL/L (ref 137–147)
Lab: 3.8 MMOL/L (ref 3.5–5.1)
Lab: 73 mg/dL (ref 70–100)

## 2018-07-11 LAB — CD4 ABSOLUTE CELL COUNT
Lab: 36 % (ref 28–63)
Lab: 867 FL (ref 440–2160)

## 2018-07-11 LAB — CHLAM/NG PCR URINE
Lab: NEGATIVE
Lab: NEGATIVE

## 2018-07-12 LAB — RPR TITER FOR TREATMENT

## 2018-07-13 LAB — HIV VIRAL LOAD PCR QUANT: Lab: 486 {copies}/mL (ref 13.5–16.5)

## 2018-07-18 ENCOUNTER — Encounter: Admit: 2018-07-18 | Discharge: 2018-07-18

## 2018-07-18 NOTE — Telephone Encounter
-----   Message from [place], MD sent at 07/18/2018  3:28 AM CDT -----  His HIV VL is up to 48,000 copies/mL last week. Can you check if he stopped taking his Triumeq and if so he should be back on it asap. Make sure he also has an appointment (Telehealth preferred if he can).

## 2018-07-18 NOTE — Telephone Encounter
Sent pt mychart msg with results and appt.

## 2018-07-24 ENCOUNTER — Encounter: Admit: 2018-07-24 | Discharge: 2018-07-24

## 2018-07-25 ENCOUNTER — Encounter: Admit: 2018-07-25 | Discharge: 2018-07-25

## 2018-08-01 ENCOUNTER — Encounter: Admit: 2018-08-01 | Discharge: 2018-08-01

## 2018-08-01 DIAGNOSIS — B20 Human immunodeficiency virus [HIV] disease: Secondary | ICD-10-CM

## 2018-08-02 ENCOUNTER — Encounter: Admit: 2018-08-02 | Discharge: 2018-08-02

## 2018-08-02 NOTE — Telephone Encounter
Please see patient MyChart message:   Dr. Willene Hatchet, I have a question regarding HIV and a person's vision. Can HIV affect a person's sight, and if so should they get a regular annual eye exam if they were glasses to make sure that their site is not deteriorating?

## 2018-08-03 ENCOUNTER — Encounter: Admit: 2018-08-03 | Discharge: 2018-08-03

## 2018-08-03 ENCOUNTER — Emergency Department: Admit: 2018-08-03 | Discharge: 2018-08-03

## 2018-08-03 DIAGNOSIS — B20 Human immunodeficiency virus [HIV] disease: Secondary | ICD-10-CM

## 2018-08-03 DIAGNOSIS — J40 Bronchitis, not specified as acute or chronic: Secondary | ICD-10-CM

## 2018-08-03 DIAGNOSIS — E785 Hyperlipidemia, unspecified: Secondary | ICD-10-CM

## 2018-08-03 DIAGNOSIS — A64 Unspecified sexually transmitted disease: Secondary | ICD-10-CM

## 2018-08-03 DIAGNOSIS — S129XXA Fracture of neck, unspecified, initial encounter: Secondary | ICD-10-CM

## 2018-08-03 DIAGNOSIS — T1491XA Suicide attempt, initial encounter: Secondary | ICD-10-CM

## 2018-08-03 DIAGNOSIS — L409 Psoriasis, unspecified: Secondary | ICD-10-CM

## 2018-08-03 MED ORDER — ASPIRIN 81 MG PO CHEW
324 mg | Freq: Once | ORAL | 0 refills | Status: CP
Start: 2018-08-03 — End: ?
  Administered 2018-08-04: 04:00:00 324 mg via ORAL

## 2018-08-04 ENCOUNTER — Emergency Department: Admit: 2018-08-04 | Discharge: 2018-08-04 | Disposition: A | Discharge status: Home / Self Care

## 2018-08-04 DIAGNOSIS — R0602 Shortness of breath: Secondary | ICD-10-CM

## 2018-08-04 DIAGNOSIS — R079 Chest pain, unspecified: Principal | ICD-10-CM

## 2018-08-04 LAB — COMPREHENSIVE METABOLIC PANEL
Lab: 0.2 mg/dL — ABNORMAL LOW (ref 0.3–1.2)
Lab: 13 mg/dL (ref 7–25)
Lab: 138 MMOL/L (ref 137–147)
Lab: 14 U/L (ref 7–56)
Lab: 26 MMOL/L (ref 21–30)
Lab: 3.8 MMOL/L (ref 3.5–5.1)
Lab: 4.3 g/dL — ABNORMAL LOW (ref 3.5–5.0)
Lab: 7 10*3/uL (ref 3–12)
Lab: 7.5 g/dL — ABNORMAL HIGH (ref 6.0–8.0)
Lab: 72 U/L — ABNORMAL HIGH (ref 25–110)
Lab: 9.9 mg/dL (ref 8.5–10.6)
Lab: 94 mg/dL (ref 70–100)
Lab: >60 mL/min (ref >60)
Lab: >60 mL/min — ABNORMAL HIGH (ref >60)

## 2018-08-04 LAB — CBC AND DIFF
Lab: 0.1 10*3/uL (ref 0–0.45)
Lab: 14 % — ABNORMAL HIGH (ref 4–12)
Lab: 40 % (ref 40–50)
Lab: 6.4 10*3/uL (ref 4.5–11.0)

## 2018-08-04 LAB — TSH WITH FREE T4 REFLEX: Lab: 0.7 uU/mL (ref 0.35–5.00)

## 2018-08-04 LAB — BNP POC ER: Lab: 16 pg/mL (ref 0–100)

## 2018-08-04 LAB — POC TROPONIN: Lab: 0 ng/mL (ref 0.00–0.05)

## 2018-08-04 NOTE — ED Notes
Pt presents with chest pressure and shortness of breath that has been present for several days. Chest pressure is non radiating. Pt states he has had a dry cough, denies fever, chills or  N/V. The shortness of breath has not limited his ADL's but it feels like he can't take a deep breath. Pt currently sating 98% on RA.     All belongings gathered and placed in belonging bag with patient labels at bedside. The bag(s) contain(s) the following:    Clothing: shirt, jeans.  Shoes: tennis shoes  Identification/Drivers license: yes  Electronics: cell phone  Other: Ankle bracelet

## 2018-08-04 NOTE — ED Notes
Pt refusing to stay any longer. Dr Ladell Pier informed. Pt signed out AMA.

## 2018-08-09 ENCOUNTER — Encounter: Admit: 2018-08-09 | Discharge: 2018-08-09

## 2018-08-09 NOTE — Telephone Encounter
ED Discharge Follow Up  Reached patient: No  Admission [facility] Name : [facility]  ED Admission Date: 08/03/18 ED Discharge Date: 08/04/18 Admission Diagnosis: Pain and Shortness of breath / cough  Discharge Diagnosis Chest pain, unspecified type  Hospital Services: Unplanned  Today's call is 5 (calendar) days post discharge    Scheduling Follow-up Appointment  Upcoming appointment date and time and with whom scheduled:   Future Appointments   Date Time Provider [place]   09/28/2018  9:20 AM Ty Hilts, DO [facility]     When was patients last PCP visit: Visit date not found  PCP primary location: [place]  PCP appointment scheduled? No, routine appt 09/28/2018  Specialist appointment scheduled? No  Both PCP and Specialist appointment scheduled: No  Is assistance with transportation needed? No    ED Communication   Did Pt call Clinic prior to going to ED? No      Almira Bar, [place]

## 2018-08-23 ENCOUNTER — Encounter: Admit: 2018-08-23 | Discharge: 2018-08-23

## 2018-08-23 NOTE — Telephone Encounter
Last ov: 04/06/17 - pt has not been seen for over a year  Next ov: none scheduled    Last ov note:    Plan:  - Labs today: CBC, CMP, HIV-VL  - STD screening today: RPR treatment, GC/TC oral/urine/rectal  - Continue Triumeq

## 2018-08-28 MED ORDER — TRIUMEQ 600-50-300 MG PO TAB
ORAL_TABLET | Freq: Every day | 1 refills | Status: DC
Start: 2018-08-28 — End: 2018-10-23

## 2018-10-13 ENCOUNTER — Encounter: Admit: 2018-10-13 | Discharge: 2018-10-13

## 2018-10-13 NOTE — Telephone Encounter
Periodic Patient Reassessment: Antiretroviral Therapy         Summary of Visit  I spoke to Mr. Vincenza Hews via telephone.  He was last seen in the clinic 04/06/2017.  He did have labs last June and his viral load was elevated to 48,600 copies.  He said he talked to Dr. Colon Flattery and explained why it was so high but he is back on track now and has not missed any doses in the past month.  He denies any other medications, prescription or over-the-counter.    He does not have a follow-up appointment but says he has the phone number to scheduling and will call as soon he hangs up with me.    Indication / Regimen     abacavir 600mg /lamivudine 300mg /dolutegravir 50mg  (Triumeq) - one tablet by mouth once daily     HIV Regimen: The patient is on a single tablet regimen.     Juliann Mule takes their medications daily.    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 It is planned to continue indefinitely which is appropriate for Juliann Mule. No renal or hepatic adjustments are required.      Response to HIV antiretroviral therapy and Therapeutic Goals:      Laboratory studies will be collected at next scheduled appointment to determine HIV virologic and immunologic response.        Labs:   CD4 Count   Date/Time Value Ref Range Status   07/10/2018 05:53 PM 867 440 - 2,160 Final          HIV Viral Load PCR   Date/Time Value Ref Range Status   07/10/2018 05:53 PM 48,600 RNA COPIES/ML Final     Comment:     Reference range: Undetected  Unit: copies/mL  Result in log copies/mL is 4.69.    -------------------ADDITIONAL INFORMATION-------------------  The quantification range of this assay is 20 to 10,000,000   copies/mL (1.30 log to 7.00 log copies/mL). Testing was   performed using the cobas HIV-1 test [employer] with the cobas 6800 System.  This test has been modified from the manufacturer's instructions. Its performance characteristics were   determined by [facility] in a manner consistent with CLIA   requirements. This test has not been cleared or approved by   the U.S. Food and Drug Administration.  [facility], [address], [place] [address]          HIV Viral Load: The patients viral load is not suppressed.     As the patient is achieving therapeutic benefit, the plan is to continue.          HBV-related labs:       HBsAg   Date/Time Value Ref Range Status   07/31/2013 12:06 PM NEG  Final          Anti HBc Total   Date Value Ref Range Status   07/31/2013 POS  Final          HCV-related labs:       Anti HCV   Date Value Ref Range Status   07/29/2016 NEG NEG-NEG Final          [id number]*[id number] status: negative     Tropism testing: not applicable         Adverse Effects:    ? Jomes Giraldo is not experiencing any significant adverse effects to this medication regimen.  Adherence:    Refill history was reviewed.  Patient reports missing 0 doses over the past week and 0 doses over the past month.   Patient was educated on importance of adherence and provider was notified if concerns.    Refill and adherence history were reviewed with the patient. The patient is adherent with refills and is meeting their refill goal. They report no missed doses over the past 30 days.  The patient is meeting their adherence goal. The patient was reminded about the refill process and re-educated on the importance of adherence..     Allergies     Allergies   Allergen Reactions   ? Sulfa (Sulfonamide Antibiotics) SEE COMMENTS     Patient reports that his father and brother are allergic, he is unsure so he reports it as an allergy.         Pregnancy status    Pregnancy status was assessed and determined to be: male: education not applicable    Medication Reconciliation    Medication history and reconciliation were performed (including prescription medications, supplements, over the counter, and herbal products). The medication list was updated and the patients? current medication list is included below.    Home Medications    Medication Sig   TRIUMEQ 600-50-300 mg tablet TAKE 1 TABLET BY MOUTH DAILY       The patient was instructed to speak with their health care provider before starting any new drug, including prescription or over the counter, natural / herbal products, or vitamins.    Drug-Drug Interactions     No new significant drug-drug or drug-food interactions were identified.     Drug-Food Interactions    Drug-food interactions were evaluated and there are no significant drug-food interactions.    This medication can be taken with or without food.    Vaccination Status Assessment     Immunization History   Administered Date(s) Administered   ? Flu Vaccine =>65 YO High-Dose (PF) 02/11/2016, 11/24/2016   ? Flu Vaccine Trivalent =>3 Yo (Preservative Free) 10/04/2013   ? Hepatitis B Vaccine Adult 3 Dose IM 10/01/2013   ? Meningococcal Conjug Vaccine IM (MenACWY-CRM)(Menveo) 06/18/2015, 02/11/2016   ? Pneumococcal Vaccine (23-Val Adult) 10/01/2013   ? Pneumococcal Vaccine(13-Val Peds/immunocompromised adult) 07/31/2013   ? Tdap Vaccine 05/11/2013       Vaccine history was reviewed with the patient. The patient was reminded about the importance of receiving an annual influenza vaccine as indicated.     Depression screening    Patient has been appropriately screened by healthcare team    Risk Evaluation and Mitigation Strategy (REMS) Assessment    No REMS is required for this medication.     Medication Education    Samuele Sutcliffe was provided with drug information for their HIV medications.  Discussion with the patient included: the medication name, regimen, dosing, frequency, duration, proper administration, storage, monitoring, common side effects, safety precautions, and food/drug interactions to be aware of.  The patient was instructed to speak to ID clinic doctor or pharmacist prior to starting any new drug--including prescription or OTC, natural products, or vitamins.  Emphasis was placed on the importance of medication compliance.  The patient was instructed to seek medical attention immediately if experiencing signs of an allergic reaction, including: rash, hives, itching; red, swollen, blistered, or peeling skin with or without fever.  The patient was also encouraged to contact the pharmacist with any further questions. Patient verbalized acceptance and understanding.     Additional Information  Outpatient Pharmacy: [facility]  Insurance: [employer] and [facility]   [facility] [facility] Program involvement: yes, Anthoney Harada at [facility] is his case manager    HIV Retention of Care: The patient was transferred to ID scheduler to make follow up appointment.    Re-assessment has been completed and the patient will be re-assessed annually.    Carmelina Dane, PharmD, BCPS, AAHIVP  Phone [phone]

## 2018-10-20 ENCOUNTER — Encounter: Admit: 2018-10-20 | Discharge: 2018-10-20

## 2018-10-20 NOTE — Telephone Encounter
Last ov: 04/06/17 - pt not seen for year, no follow up visit scheduled    Last ov note:    Plan:  - Labs today: CBC, CMP, HIV-VL  - STD screening today:RPR treatment,GC/TC oral/urine/rectal  - Continue Triumeq

## 2018-10-23 MED ORDER — TRIUMEQ 600-50-300 MG PO TAB
ORAL_TABLET | Freq: Every day | ORAL | 1 refills | 30.00000 days | Status: DC
Start: 2018-10-23 — End: 2019-01-03

## 2018-10-23 NOTE — Telephone Encounter
Left message for patient to call back for opening on Thursday October 8th.

## 2018-10-23 NOTE — Telephone Encounter
-----   Message from Sharlotte Alamo, MD sent at 10/23/2018  1:48 AM CDT -----  It's urgent he repeats his HIV VL that has been ordered in July because of high VL in June.

## 2018-10-26 NOTE — Telephone Encounter
Left patient a message to check in after missed appointment and to get him rescheduled.

## 2018-12-26 ENCOUNTER — Encounter: Admit: 2018-12-26 | Discharge: 2018-12-26

## 2018-12-26 MED ORDER — TRIUMEQ 600-50-300 MG PO TAB
ORAL_TABLET | Freq: Every day | 0 refills
Start: 2018-12-26 — End: ?

## 2018-12-26 NOTE — Telephone Encounter
Last ov: 04/06/17 with no future appointment

## 2018-12-27 ENCOUNTER — Encounter: Admit: 2018-12-27 | Discharge: 2018-12-27

## 2018-12-27 ENCOUNTER — Ambulatory Visit: Admit: 2018-12-27 | Discharge: 2018-12-27 | Payer: No Typology Code available for payment source

## 2018-12-27 DIAGNOSIS — B20 Human immunodeficiency virus [HIV] disease: Secondary | ICD-10-CM

## 2019-01-03 MED ORDER — TRIUMEQ 600-50-300 MG PO TAB
1 | ORAL_TABLET | Freq: Every day | ORAL | 3 refills | Status: DC
Start: 2019-01-03 — End: 2019-04-29

## 2019-01-30 ENCOUNTER — Encounter: Admit: 2019-01-30 | Discharge: 2019-01-30

## 2019-01-30 NOTE — Telephone Encounter
MyChart message sent to patient.

## 2019-01-30 NOTE — Telephone Encounter
-----   Message from [place], MD sent at 01/29/2019  7:24 PM CST -----  Remind him of appointment later this month and need for repeat his HIV VL (last one was 346 copies)  He can do the lab before his appointment if he wants.

## 2019-02-01 ENCOUNTER — Encounter: Admit: 2019-02-01 | Discharge: 2019-02-01 | Payer: No Typology Code available for payment source

## 2019-02-01 ENCOUNTER — Emergency Department: Admit: 2019-02-01 | Discharge: 2019-02-01 | Payer: No Typology Code available for payment source

## 2019-02-01 DIAGNOSIS — J40 Bronchitis, not specified as acute or chronic: Secondary | ICD-10-CM

## 2019-02-01 DIAGNOSIS — B20 Human immunodeficiency virus [HIV] disease: Secondary | ICD-10-CM

## 2019-02-01 DIAGNOSIS — L0291 Cutaneous abscess, unspecified: Secondary | ICD-10-CM

## 2019-02-01 DIAGNOSIS — L409 Psoriasis, unspecified: Secondary | ICD-10-CM

## 2019-02-01 DIAGNOSIS — E785 Hyperlipidemia, unspecified: Secondary | ICD-10-CM

## 2019-02-01 DIAGNOSIS — S129XXA Fracture of neck, unspecified, initial encounter: Secondary | ICD-10-CM

## 2019-02-01 DIAGNOSIS — T1491XA Suicide attempt, initial encounter: Secondary | ICD-10-CM

## 2019-02-01 DIAGNOSIS — A64 Unspecified sexually transmitted disease: Secondary | ICD-10-CM

## 2019-02-01 LAB — COMPREHENSIVE METABOLIC PANEL
Lab: 105 MMOL/L — ABNORMAL HIGH (ref 98–110)
Lab: 139 MMOL/L (ref 137–147)
Lab: 16 mg/dL (ref 7–25)
Lab: 20 U/L (ref 7–40)
Lab: 21 U/L (ref 7–56)
Lab: 29 MMOL/L (ref 21–30)
Lab: 4.2 g/dL (ref 3.5–5.0)
Lab: 5 K/UL (ref 3–12)
Lab: 7.7 g/dL — ABNORMAL HIGH (ref 6.0–8.0)
Lab: 77 U/L (ref 25–110)
Lab: 85 mg/dL (ref 70–100)
Lab: 9.8 mg/dL (ref 8.5–10.6)
Lab: >60 mL/min (ref >60)

## 2019-02-01 LAB — C REACTIVE PROTEIN (CRP): Lab: 0.1 mg/dL (ref <1.0)

## 2019-02-01 LAB — POC LACTATE: Lab: 2 MMOL/L (ref 0.5–2.0)

## 2019-02-01 LAB — CBC AND DIFF
Lab: 33 g/dL (ref 32.0–36.0)
Lab: 5.2 10*3/uL (ref 4.5–11.0)
Lab: 6.8 FL — ABNORMAL LOW (ref 7–11)

## 2019-02-01 MED ORDER — CLINDAMYCIN HCL 300 MG PO CAP
300 mg | ORAL_CAPSULE | Freq: Four times a day (QID) | ORAL | 0 refills | Status: DC
Start: 2019-02-01 — End: 2019-03-21

## 2019-02-01 MED ORDER — CLINDAMYCIN HCL 150 MG PO CAP
300 mg | Freq: Once | ORAL | 0 refills | Status: CP
Start: 2019-02-01 — End: ?
  Administered 2019-02-01: 20:00:00 300 mg via ORAL

## 2019-02-01 MED ORDER — LIDOCAINE HCL 10 MG/ML (1 %) IJ SOLN
20 mL | Freq: Once | INTRAMUSCULAR | 0 refills | Status: CP
Start: 2019-02-01 — End: ?
  Administered 2019-02-01: 19:00:00 20 mL via INTRAMUSCULAR

## 2019-02-01 NOTE — ED Notes
Pt d/c home . D/c instructions reviewed with pt . Discussed in detail signs and symptoms , daily care recommendations and limitations,pain management and follow up.  Understanding verbalized.via teach back method. All questions and concerns addressed.   Pt discharge approved by Coralyn Mark NP

## 2019-02-01 NOTE — ED Notes
Assumed care of pt- pt reports injected meth on 1/6 or 7 and developed red, swollen area rt ac. Pt aaox4 MAE skin w/d pink lungs clear. goldball sized area of swelling and redness noted rt a/c.    Medical History:   Diagnosis Date    Bronchitis 07/17/2015    Fracture cervical vertebra-closed (HCC)     c6, c7 - no surgical repair    Human immunodeficiency virus (HIV) disease ([place])     Hyperlipidemia 06/13/2013    Psoriasis, unspecified 02/21/2014    Sexually transmitted disease     Suicide attempt ([place])

## 2019-02-01 NOTE — ED Provider Notes
Charles Walls is a 49 y.o. male.    Chief Complaint:  Chief Complaint   Patient presents with   ? 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 male, with a history of HIV, HLD, and renal cell cancer who presents to the emergency department for abscess. Patient reports he injected meth into his right arm on 01/23/18, stating shortly after he developed redness, swelling, and pain to his distal anterior right upper arm. Patient reports his symptoms have continued to progress since, endorsing intermittent fevers and chills over the past few days and states the pain is now radiating down into his right forearm and up into his neck. Patient denies hx of similar symptoms. Patient otherwise denies nausea, vomiting, or any other infectious symptoms.      History provided by:  Patient and medical records  Language interpreter used: No        Review of Systems:  Review of Systems   Constitutional: Positive for chills and fever.   HENT: Negative for congestion and rhinorrhea.    Eyes: Negative for visual disturbance.   Respiratory: Negative for cough and shortness of breath.    Cardiovascular: Negative for chest pain.   Gastrointestinal: Negative for abdominal pain, diarrhea, nausea and vomiting.   Genitourinary: Negative for dysuria.   Musculoskeletal: Negative.    Skin: Positive for wound (abscess). Negative for rash.   Neurological: Negative for syncope.       Allergies:  Sulfa (sulfonamide antibiotics)    Past Medical History:  Medical History:   Diagnosis Date   ? Bronchitis 07/17/2015   ? Fracture cervical vertebra-closed (HCC)     c6, c7 - no surgical repair   ? Human immunodeficiency virus (HIV) disease (HCC)    ? Hyperlipidemia 06/13/2013   ? Psoriasis, unspecified 02/21/2014   ? Sexually transmitted disease    ? Suicide attempt [facility])        Past Surgical History:  Surgical History:   Procedure Laterality Date ? COLONOSCOPY N/A 06/17/2015    Performed by Jolee Ewing, MD at [facility] ENDO   ? ROBOT ASSISTED LAPAROSCOPIC NEPHRECTOMY PARTIAL WITH INTRAOPERATIVE ULTRASOUND  Left 06/09/2017    Performed by Daryl Eastern, MD at [facility] OR   ? HERNIA REPAIR     ? HX APPENDECTOMY     ? PR LAPAROSCOPY SURG RPR INITIAL INGUINAL HERNIA      right       Pertinent medical/surgical history reviewed  Medical History:   Diagnosis Date   ? Bronchitis 07/17/2015   ? Fracture cervical vertebra-closed (HCC)     c6, c7 - no surgical repair   ? Human immunodeficiency virus (HIV) disease (HCC)    ? Hyperlipidemia 06/13/2013   ? Psoriasis, unspecified 02/21/2014   ? Sexually transmitted disease    ? Suicide attempt [facility])      Surgical History:   Procedure Laterality Date   ? COLONOSCOPY N/A 06/17/2015    Performed by Jolee Ewing, MD at [facility] ENDO   ? ROBOT ASSISTED LAPAROSCOPIC NEPHRECTOMY PARTIAL WITH INTRAOPERATIVE ULTRASOUND  Left 06/09/2017    Performed by Daryl Eastern, MD at [facility] OR   ? HERNIA REPAIR     ? HX APPENDECTOMY     ? PR LAPAROSCOPY SURG RPR INITIAL INGUINAL HERNIA      right       Social History:  Social History  Tobacco Use   ? Smoking status: Current Every Day Smoker     Packs/day: 0.50     Years: 25.00     Pack years: 12.50     Types: Cigarettes     Last attempt to quit: 01/2017     Years since quitting: 2.0   ? Smokeless tobacco: Never Used   ? Tobacco comment: vapeing   Substance Use Topics   ? Alcohol use: Not Currently     Alcohol/week: 0.0 standard drinks   ? Drug use: Yes     Types: Methamphetamines, IV     Comment: IV crystal meth     Social History     Substance and Sexual Activity   Drug Use Yes   ? Types: Methamphetamines, IV    Comment: IV crystal meth       Family History:  Family History   Problem Relation Age of Onset   ? Melanoma Father    ? Cancer Father        Vitals:  ED Vitals    Date and Time T BP P RR SPO2P SPO2 User   02/01/19 1400 36.9 ?C (98.4 ?F) 122/63 -- 15 PER MINUTE 88 100 % ES 02/01/19 1200 -- 128/77 -- 16 PER MINUTE 98 100 % ES   02/01/19 1101 36.3 ?C (97.4 ?F) 131/76 -- 16 PER MINUTE 94 100 % ES          Physical Exam:  Physical Exam  Vitals signs and nursing note reviewed.   Constitutional:       General: He is not in acute distress.     Appearance: He is well-developed. He is not diaphoretic.   HENT:      Head: Normocephalic and atraumatic.      Right Ear: External ear normal.      Left Ear: External ear normal.      Nose: Nose normal.   Eyes:      Extraocular Movements: Extraocular movements intact.      Conjunctiva/sclera: Conjunctivae normal.      Pupils: Pupils are equal, round, and reactive to light.   Neck:      Musculoskeletal: Neck supple.   Cardiovascular:      Rate and Rhythm: Normal rate and regular rhythm.      Heart sounds: Normal heart sounds.   Pulmonary:      Effort: Pulmonary effort is normal. No respiratory distress.      Breath sounds: Normal breath sounds.   Abdominal:      General: There is no distension.      Palpations: Abdomen is soft.   Musculoskeletal: Normal range of motion.         General: No deformity.   Skin:     General: Skin is warm and dry.      Capillary Refill: Capillary refill takes less than 2 seconds.      Findings: Abscess present.          Neurological:      Mental Status: He is alert and oriented to person, place, and time.   Psychiatric:         Behavior: Behavior normal.         Laboratory Results:  Labs Reviewed   CBC AND DIFF - Abnormal       Result Value Ref Range Status    White Blood Cells 5.2  4.5 - 11.0 K/UL Final    RBC 4.81  4.4 - 5.5 M/UL Final    Hemoglobin  15.3  13.5 - 16.5 GM/DL Final    Hematocrit 16.1  40 - 50 % Final    MCV 94.0  80 - 100 FL Final    MCH 31.8  26 - 34 PG Final    MCHC 33.9  32.0 - 36.0 G/DL Final    RDW 09.6  11 - 15 % Final    Platelet Count 606 (*) 150 - 400 K/UL Final    MPV 6.8 (*) 7 - 11 FL Final    Neutrophils 42  41 - 77 % Final    Lymphocytes 44  24 - 44 % Final    Monocytes 10  4 - 12 % Final Eosinophils 2  0 - 5 % Final    Basophils 2  0 - 2 % Final    Absolute Neutrophil Count 2.18  1.8 - 7.0 K/UL Final    Absolute Lymph Count 2.26  1.0 - 4.8 K/UL Final    Absolute Monocyte Count 0.54  0 - 0.80 K/UL Final    Absolute Eosinophil Count 0.12  0 - 0.45 K/UL Final    Absolute Basophil Count 0.08  0 - 0.20 K/UL Final    MDW (Monocyte Distribution Width) 18.7  <20.7 Final   COMPREHENSIVE METABOLIC PANEL - Abnormal    Sodium 139  137 - 147 MMOL/L Final    Potassium 4.1  3.5 - 5.1 MMOL/L Final    Chloride 105  98 - 110 MMOL/L Final    Glucose 85  70 - 100 MG/DL Final    Blood Urea Nitrogen 16  7 - 25 MG/DL Final    Creatinine 0.45  0.4 - 1.24 MG/DL Final    Calcium 9.8  8.5 - 10.6 MG/DL Final    Total Protein 7.7  6.0 - 8.0 G/DL Final    Total Bilirubin 0.2 (*) 0.3 - 1.2 MG/DL Final    Albumin 4.2  3.5 - 5.0 G/DL Final    Alk Phosphatase 77  25 - 110 U/L Final    AST (SGOT) 20  7 - 40 U/L Final    CO2 29  21 - 30 MMOL/L Final    ALT (SGPT) 21  7 - 56 U/L Final    Anion Gap 5  3 - 12 Final    eGFR Non African American >60  >60 mL/min Final    eGFR African American >60  >60 mL/min Final   SED RATE - Abnormal    Sed Rate -ESR 16 (*) 0 - 15 MM/HR Final   CULTURE-BLOOD W/SENSITIVITY   C REACTIVE PROTEIN (CRP)    C-Reactive Protein 0.18  <1.0 MG/DL Final   POC LACTATE    LACTIC ACID POC 2.0  0.5 - 2.0 MMOL/L Final   POC LACTATE   POC LACTATE          Radiology Interpretation:    HUMERUS MIN 2 VIEWS RIGHT   Final Result         1.  Rounded lucency overlying the soft tissues of the anteromedial mid to distal upper arm which may represent gas within the soft tissues. Recommend ultrasound or CT for further evaluation if there is clinical concern for abscess in this region.   2.  No acute fracture or osseous abnormality. No periosteal reaction or osseous erosion to suggest osteomyelitis.   3.  Shoulder and elbow joint are grossly maintained. By my electronic signature, I attest that I have personally reviewed the images for this examination and formulated the  interpretations and opinions expressed in this report          Finalized by Kimber Relic, M.D. on 02/01/2019 1:34 PM. Dictated by Vangie Bicker, MD on 02/01/2019 1:04 PM.         POC ED US SOFT TISSUE/MUSCULOSKELATAL    (Results Pending)         EKG:        ED Course:  Patient examined by Rollen Sox ARNP, and Dr. Kayren Eaves.  Patient with abscess, will order labs.  No leukocytosis, all labs WNL.  Dr. Kayren Eaves bedside ultrasound reveals abscess.  X-ray ordered to check for foreign body.  Has some gas in tissue, no foreign body.  Abscess drained, with copious amount of purulent drainage.  Will treat with Clindamycin.  Prescription to patient.  Discharge instructions to patient.       ED Scoring:                             MDM  Reviewed: previous chart, nursing note and vitals  Reviewed previous: labs  Interpretation: labs        Facility Administered Meds:  Medications   lidocaine 1 % (10mg /mL) injection 20 mL (20 mL Injection Given 02/01/19 1245)   clindamycin (CLEOCIN) capsule 300 mg (300 mg Oral Given 02/01/19 1349)         Clinical Impression:  Clinical Impression   Abscess       Disposition/Follow up  ED Disposition     ED Disposition    Discharge        [facility]  [address]  [place] [address]  [phone]  Schedule an appointment as soon as possible for a visit in 3 days  if you have increased pain, redness, swelling, return to ER.  Make an appointment with your PCP for evaluation. Use warm compress.      Medications:  Discharge Medication List as of 02/01/2019  1:33 PM      START taking these medications    Details   clindamycin (CLEOCIN) 300 mg capsule Take one capsule by mouth four times daily. Take with 8oz of water., Disp-40 capsule,R-0, Print             Procedure Notes:  I & D    Date/Time: 02/01/2019 1:30 PM  Performed by: Rollen Sox, APRN Authorized by: Rollen Sox, APRN   Consent: Verbal consent obtained.  Risks and benefits: risks, benefits and alternatives were discussed  Consent given by: patient  Patient understanding: patient states understanding of the procedure being performed  Patient identity confirmed: verbally with patient  Type: abscess  Body area: upper extremity  Location details: right arm  Anesthesia: local infiltration    Anesthesia:  Local Anesthetic: lidocaine 1% without epinephrine  Anesthetic total: 3 mL  Risk factor: underlying major vessel  Scalpel size: 11  Incision type: elliptical  Complexity: simple  Drainage: serosanguinous  Drainage amount: copious  Wound treatment: wound left open  Patient tolerance: patient tolerated the procedure well with no immediate complications            Attestation / Supervision:  I, Rockne Menghini, am scribing for and in the presence of Rollen Sox, APRN.      Rockne Menghini    Attestation / Supervision Note concerning Tavian Ferretiz: The service was provided by the APP alone with immediate availability of a physician in the ED. and I, Aurther Loft  Joesphine Bare, APRN, personally performed the services described in this documentation as scribed in my presence and it is both accurate and complete.    Linton Flemings, APRN

## 2019-02-01 NOTE — ED Notes
Report to Reagan RN

## 2019-02-06 NOTE — Progress Notes
Subjective:       History of Present Illness  Charles Walls is a 49 y.o. male here for HIV follow up.  ?  Patient has been followed by Dr Charles Walls previously until 2014 when he switched to [facility]. He was following Dr Charles Walls until she closed her outpatient practice and he transferred to me.   Prior regimens Kaletra, Presizta, Viracept (diarrhea), Viread, Truvada, Combivir.   ??  He reports he was diagnosed with HIV in 2000 while in [facility] (risk MSM). He then moved to [place] for few years and came back in 2006. He had relationship with women in the past, but was married to an HIV negative male. They are using condoms except for oral sex. They have no other partners for the past 2 years.   Charles Walls was diagnosed with Chlamydia in June 2014 and treated. His partner was also treated.   He continues on Darunavir, Truvada, Norvir and has good virological and immunological response. He said he was seen q92months by his [place] doctor and would like this way.   ?  Currently feeling well. His feet are better after he started Kelfex and topical creams prescribed by Dermatology last week.   He is enrolled in RWSW at [facility] [place], and also has Media planner.  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 He is tested for TB there and has always been negative.  Still smokes 1 ppd and is interested in quitting but waiting for change in insurance in March to get discount on his premium.        04/06/17 He was admitted from 02/01/17 through 02/04/17 with diverticulitis. 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 He is scheduled for partial nephrectomy in March 2019. Currently he is anxious for his renal mass lesion and worries about his right shoulder pain, that he describes chronic and due to a rotator cuff injuries. He quit smoking 2 months ago and using Vapor currently. For HIV, he is taking Triumeq without any missed dose, good control. He admitted that he is sexually active with 10 years younger person who was recently diagnosed as Syphilis.     02/07/19  Here for follow up for HIV. Has had difficult few months.   He has been going through a divorce with his husband that happened in March. Since then has had multiple sexual partners. One of those partners may have given his syphilis, he got a single dose for primary syphilis at the health department and tested negative according to him. Was off his medications for a couple of months when he went through the divorce but is now back on them and has been compliant.   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 Was recently in the ED due to a cellulitis and an abscess from the injections. Blood cultures at that time were negative, abscess was I&D but was not sent for cultures. Prescribed 10 days of clindamycin. States that his arm has improved but developed loose stools, 2 per day.   He states that he does not share needles, does not reuse needles. Uses a needle exchange program.        Review of Systems   Constitutional: Negative.    HENT: Negative.    Eyes: Negative.    Respiratory: Positive for shortness of breath.    Cardiovascular: Negative.    Gastrointestinal: Negative.    Endocrine: Negative.  Genitourinary: Negative.    Musculoskeletal: Positive for arthralgias.   Skin: Negative.    Neurological: Negative.    Hematological: Negative. Psychiatric/Behavioral: Negative.        Objective:         ? abacavir-dolutegravir-lamivud (TRIUMEQ) 600-50-300 mg tablet Take one tablet by mouth daily.   ? clindamycin (CLEOCIN) 300 mg capsule Take one capsule by mouth four times daily. Take with 8oz of water.     There were no vitals filed for this visit.  There is no height or weight on file to calculate BMI.     Physical Exam  Constitutional: He appears well-developed and well-nourished. No distress.   HENT: ears unremarkable  Head: Normocephalic and atraumatic.   Eyes: No scleral icterus.   Cardiovascular: Normal rate.    Pulmonary/Chest: Effort normal.   Musculoskeletal: He exhibits no edema.   Neurological: He is alert. He exhibits normal muscle tone.   Skin: right upper extremity abscess without significant fluctuance, no erythema extending past the elbow.   Psychiatric: He has a normal mood and affect.   Vitals reviewed.       Assessment:  HIV   ? Diagnosed 2000 while in [facility]. Dr Charles Walls [facility]).   ? Establishment of care with [facility] : 07/31/13  ? Mode of transmission / risk factors : MSM  ? CD4 count / viral load at the time of diagnosis : not sure , likely CD 4 around 250  ? History of OI : none reported  ? Need for prophylaxis : none reported  ? FePO4 14% (within normal)  ?  Past HIV data Viral load/ CD4 count, ART therapy :  Date   CD4 ( % )  Viral load ( log)  ART therapy  Comments / genotype Resistence testing     11/28/13  In process  <40 (<1.6)  ? ?   08/27/13  993(48.7)  <40 (<1.6)  ? ?   06/13/13  ? 54 (1.73)  ([place])  ?   03/30/13  Not done  <20(<1.3)  ? ?   03/26/13  724(57%)?  24(1.380  ? ?   01/15/13  1124(48)  <20 (<1.3)  ? ?   10/23/12  1154  <20(<1.3)  ? ?   08/23/12  901  <20(<1.3)  ? ?   07/27/12  991  <20(<1.3)  Likely switched to new regimen daruavir and ritonavir instead of Kaletra  (mid 2014)    06/30/12  1052  <20 ND (<1.3)  ? ?   05/08/12  948(46)  3891.58  ? ?   03/27/12  Not done  22(1.34)  ? ?   03/13/12  Not done  31(1.4)  ? ? 02/25/12  879(47)  41(1.61)  ? ?   02/11/12  962(43)  34(1.53)  ? ?   10/27/12  1072(46)  61(1.79)  ? ?   01/14/12  849(50)  46(1.66)  Was on Keletra and Truvada  ?   12/23/11  936 (46)  53 (1.72)  ? ?   ?  Base line lab parameter / screening   ? TB test : T spot TB test negative on 07/31/13  ? Toxoplasma serology: IgG negative on 07/31/13  ? Hepatitis serology : 07/31/13  ? Hepatitis BS antigen : negative  ? Hepatitis BS antibody : 10.8. (immune)  ? Hepatitis B core antibody: negative  ? Hepatitis A antibody: positive  ? Hepatis C antibody: negative  ?  Immunizations   ? Hepatitis vaccine : received both  A&B: received booster Hepatitis B vaccine 10/01/13  ? Tdap vaccine : 05/11/13  ? Prevnar ( 07/31/13), PPSV 23 ( 10/01/13)  ? Menveo in 2017  ? Annual flu shot  ?  H/O STD / STD screening / anal pap/ risk factors   ? H/O STDs : H/O chlamydia 07/27/12 : treated  ? Syphilis test : negative / patient in 07/2012  ? Urine for GC / CT PCR : 07/31/13 Negative , syphilis negative  ? Anal pap smear : 2-3 years ago , negative / patient  ? On going risk factors :  ? H/O multiple sexual partners in the past.  ? previously having monogamous relationship with an HIV negative partner (now married). He reports practicing protected sex and sometimes he also practices oral sex without protection.   ? Anal pap discussed 02/2014. He declined.  ? Completed 3 doses of Bicillin LA on 09/01/16   ?  Prolonged ART therapy / lab monitoring   ? UA 05/21/16 protein negative , glucose negative.  ? hemoglobin A1C 5.5 on 02/02/17.  ? Vitamin D level (on 05/21/16) 29.3: Continue OTC Vitamin D  ?  Other co morbidities   ? History of tinea pedis.  ? Intermittent chest pain  : Tobacco dependence. Not drinking beer anymore. Stop smoking in January 2019 and using Vapor.  : Has strong family history of heart attack.   : EF 65-70%, TEE 02/04/17: Bicuspid aortic valve with focal sclerosis.  ? History of neck pain. Seen neurosurgery in the past per note. ? Chronic cough : currently being addressed by PCP : 25 year pack smoking history.  ? Headache. Head CT unremarkable 11/28/13.  ? Rt shoulder pain, chronic  ? 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    H/o Diverticulitis  ? Admitted 02/01/17 - 02/04/17, treated    Left upper pole renal mass  ? Incidental findings when he was admitted with Diverticulitis in January 2019.  ? 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   ? Scheduled for partial nephrectomy in March 209.      Plan:  - Labs today: CBC, CMP, RPR, Hep C screen, HBsAb check, vitamin D level  - Will do his flu shot today since he has not had one yet.   - Continue with clindamycin given improvement in cellulitis, he has been given instructions to call us if his loose stools become more frequent.  - STD screening today: RPR treatment, GC/TC oral/urine/rectal  - Continue Triumeq  - Follow up in 1 month.

## 2019-02-07 ENCOUNTER — Encounter

## 2019-02-07 DIAGNOSIS — Z113 Encounter for screening for infections with a predominantly sexual mode of transmission: Secondary | ICD-10-CM

## 2019-02-07 DIAGNOSIS — Z23 Encounter for immunization: Secondary | ICD-10-CM

## 2019-02-07 DIAGNOSIS — B20 Human immunodeficiency virus [HIV] disease: Secondary | ICD-10-CM

## 2019-02-07 DIAGNOSIS — T1491XA Suicide attempt, initial encounter: Secondary | ICD-10-CM

## 2019-02-07 DIAGNOSIS — Z0000 Encounter for general adult medical examination without abnormal findings: Secondary | ICD-10-CM

## 2019-02-07 DIAGNOSIS — L409 Psoriasis, unspecified: Secondary | ICD-10-CM

## 2019-02-07 DIAGNOSIS — J40 Bronchitis, not specified as acute or chronic: Secondary | ICD-10-CM

## 2019-02-07 DIAGNOSIS — E785 Hyperlipidemia, unspecified: Secondary | ICD-10-CM

## 2019-02-07 DIAGNOSIS — A64 Unspecified sexually transmitted disease: Secondary | ICD-10-CM

## 2019-02-07 DIAGNOSIS — S129XXA Fracture of neck, unspecified, initial encounter: Secondary | ICD-10-CM

## 2019-02-07 NOTE — Progress Notes
Periodic Patient Reassessment: Antiretroviral Therapy         Summary of Visit  Charles Walls was here for a follow-up visit with Dr. Colon Flattery. He was last seen in clinic on 04/06/2017. He denies any missed doses of Triumeq over the past month. Did obtained viral load on 02/01/19 while in the ED for an abscess. He is virally suppressed.     The only new prescription he started was a course of a clindamycin for the abscess on his arm. He was recently seen in the ED for a golf-sized abscess obtain from shooting crystal meth into his arm. He denies missing doses of clindamycin.    Indication / Regimen     abacavir 600mg /lamivudine 300mg /dolutegravir 50mg  (Triumeq) - one tablet by mouth once daily     HIV Regimen: The patient is on a single tablet regimen.     Charles Walls takes their medications daily.    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 It is planned to continue indefinitely which is appropriate for Charles Walls. No renal or hepatic adjustments are required.      Response to HIV antiretroviral therapy and Therapeutic Goals:      Patient has improved HIV virologic and immunologic response.        Labs:   CD4 Count   Date/Time Value Ref Range Status   02/01/2019 02:13 PM 1,007 440 - 2,160 Final          HIV Viral Load PCR   Date/Time Value Ref Range Status   02/01/2019 02:13 PM <20 RNA COPIES/ML Final     Comment:     Reference range: Undetected  Unit: copies/mL  Result in log copies/mL is <1.30.    HIV-1 RNA is detected, but level present is <20 copies/mL   (<1.30 log copies/mL). This assay cannot accurately   quantify HIV-1 RNA below this level.    -------------------ADDITIONAL INFORMATION-------------------  The quantification range of this assay is 20 to 10,000,000   copies/mL (1.30 log to 7.00 log copies/mL). Testing was   performed using the cobas HIV-1 test [employer] with the cobas 6800 System.  This test has been modified from the manufacturer's   instructions. Its performance characteristics were   determined by [facility] in a manner consistent with CLIA   requirements. This test has not been cleared or approved by   the U.S. Food and Drug Administration.  [facility], [address], [place] [address]          HIV Viral Load: The patients viral load is suppressed.     As the patient is achieving therapeutic benefit, the plan is to continue.          HBV-related labs:       HBsAg   Date/Time Value Ref Range Status   07/31/2013 12:06 PM NEG  Final          Anti HBc Total   Date Value Ref Range Status   07/31/2013 POS  Final          HCV-related labs:       Anti HCV   Date Value Ref Range Status   07/29/2016 NEG NEG-NEG Final          [id number] status: negative     Tropism testing: not applicable         Adverse Effects:    ? Charles Walls  is not experiencing any significant adverse effects to this medication regimen.    Adherence:    Refill history was reviewed.  Patient reports missing 0 doses over the past week and 0 doses over the past month.   Patient was educated on importance of adherence and provider was notified if concerns.    Refill and adherence history were reviewed with the patient. The patient is adherent with refills and is meeting their refill goal. They report no missed doses over the past 30 days.  The patient is meeting their adherence goal. The patient was reminded about the refill process and re-educated on the importance of adherence..     Allergies     Allergies   Allergen Reactions   ? Sulfa (Sulfonamide Antibiotics) SEE COMMENTS     Patient reports that his father and brother are allergic, he is unsure so he reports it as an allergy.         Pregnancy status    Pregnancy status was assessed and determined to be: male: education not applicable    Medication Reconciliation Medication history and reconciliation were performed (including prescription medications, supplements, over the counter, and herbal products). The medication list was updated and the patients? current medication list is included below.    Home Medications    Medication Sig   abacavir-dolutegravir-lamivud (TRIUMEQ) 600-50-300 mg tablet Take one tablet by mouth daily.   clindamycin (CLEOCIN) 300 mg capsule Take one capsule by mouth four times daily. Take with 8oz of water.       The patient was instructed to speak with their health care provider before starting any new drug, including prescription or over the counter, natural / herbal products, or vitamins.    Drug-Drug Interactions     No new significant drug-drug or drug-food interactions were identified.     Drug-Food Interactions    Drug-food interactions were evaluated and there are no significant drug-food interactions.    This medication can be taken with or without food.    Vaccination Status Assessment     Immunization History   Administered Date(s) Administered   ? Flu Vaccine =>65 YO High-Dose (PF) 02/11/2016, 11/24/2016   ? Flu Vaccine Trivalent =>3 Yo (Preservative Free) 10/04/2013   ? Hepatitis B Vaccine Adult 3 Dose IM 10/01/2013   ? Meningococcal Conjug Vaccine IM (MenACWY-CRM)(Menveo) 06/18/2015, 02/11/2016   ? Pneumococcal Vaccine (23-Val Adult) 10/01/2013   ? Pneumococcal Vaccine(13-Val Peds/immunocompromised adult) 07/31/2013   ? Tdap Vaccine 05/11/2013       Vaccine history was reviewed with the patient. The patient was reminded about the importance of receiving an annual influenza vaccine as indicated.     Depression screening    Patient has been appropriately screened by healthcare team    Risk Evaluation and Mitigation Strategy (REMS) Assessment    No REMS is required for this medication.     Medication Education Charles Walls was provided with drug information for their HIV medications.  Discussion with the patient included: the medication name, regimen, dosing, frequency, duration, proper administration, storage, monitoring, common side effects, safety precautions, and food/drug interactions to be aware of.  The patient was instructed to speak to ID clinic doctor or pharmacist prior to starting any new drug--including prescription or OTC, natural products, or vitamins.  Emphasis was placed on the importance of medication compliance.  The patient was instructed to seek medical attention immediately if experiencing signs of an allergic reaction, including: rash, hives, itching; red, swollen, blistered, or peeling skin  with or without fever.  The patient was also encouraged to contact the pharmacist with any further questions. Patient verbalized acceptance and understanding.     Additional Information    Outpatient Pharmacy: [facility]  Insurance: [employer] and [employer]   Juanell Fairly Program involvement: yes, Anthoney Harada at [facility] is his case manager      Re-assessment has been completed and the patient will be re-assessed annually.      Irineo Axon, PharmD  PGY2 Ambulatory Care Pharmacy Resident  Phone [phone]

## 2019-02-07 NOTE — Progress Notes
Administered 0.5mL of high dose quadrivalent influenza vaccine into left deltoid. Patient signed consent and was given VIS handout.  Injection was tolerated well and patient had no other questions or concerns at this time.

## 2019-02-19 ENCOUNTER — Encounter: Admit: 2019-02-19 | Discharge: 2019-02-19 | Payer: No Typology Code available for payment source

## 2019-02-19 DIAGNOSIS — R197 Diarrhea, unspecified: Secondary | ICD-10-CM

## 2019-02-19 NOTE — Telephone Encounter
Charles Walls calls in states that he is having diarrhea 4-5 times daily, will take imodium and it will firm up, otherwise when it wears off it is liquid. Has been having some LLQ discomfort and nausea. Has been off Clindamycin for a little over a week now.    Forwarding to provider for recommendations.

## 2019-02-19 NOTE — Telephone Encounter
Spoke with patient, explained c-diff order placed. He will come to pick up the kit and drop off specimen.

## 2019-02-20 ENCOUNTER — Encounter: Admit: 2019-02-20 | Discharge: 2019-02-20 | Payer: No Typology Code available for payment source

## 2019-02-20 DIAGNOSIS — F1990 Other psychoactive substance use, unspecified, uncomplicated: Secondary | ICD-10-CM

## 2019-02-20 DIAGNOSIS — F331 Major depressive disorder, recurrent, moderate: Secondary | ICD-10-CM

## 2019-02-27 ENCOUNTER — Encounter: Admit: 2019-02-27 | Discharge: 2019-02-27 | Payer: No Typology Code available for payment source

## 2019-02-27 DIAGNOSIS — F1990 Other psychoactive substance use, unspecified, uncomplicated: Secondary | ICD-10-CM

## 2019-03-20 ENCOUNTER — Inpatient Hospital Stay: Admit: 2019-03-20 | Payer: No Typology Code available for payment source

## 2019-03-20 ENCOUNTER — Encounter: Admit: 2019-03-20 | Discharge: 2019-03-20 | Payer: No Typology Code available for payment source

## 2019-03-20 ENCOUNTER — Emergency Department: Admit: 2019-03-20 | Discharge: 2019-03-20 | Payer: No Typology Code available for payment source

## 2019-03-20 DIAGNOSIS — B20 Human immunodeficiency virus [HIV] disease: Secondary | ICD-10-CM

## 2019-03-20 DIAGNOSIS — L409 Psoriasis, unspecified: Secondary | ICD-10-CM

## 2019-03-20 DIAGNOSIS — A419 Sepsis, unspecified organism: Secondary | ICD-10-CM

## 2019-03-20 DIAGNOSIS — T1491XA Suicide attempt, initial encounter: Secondary | ICD-10-CM

## 2019-03-20 DIAGNOSIS — S129XXA Fracture of neck, unspecified, initial encounter: Secondary | ICD-10-CM

## 2019-03-20 DIAGNOSIS — E785 Hyperlipidemia, unspecified: Secondary | ICD-10-CM

## 2019-03-20 LAB — CBC AND DIFF
Lab: 0 10*3/uL (ref 0–0.20)
Lab: 0 10*3/uL (ref 0–0.45)
Lab: 10 10*3/uL (ref 4.5–11.0)
Lab: 22 — ABNORMAL HIGH (ref <20.7)

## 2019-03-20 LAB — POC TROPONIN: Lab: 0 ng/mL (ref 0.00–0.05)

## 2019-03-20 LAB — POC LACTATE: Lab: 1.7 MMOL/L (ref 0.5–2.0)

## 2019-03-20 MED ORDER — ABACAVIR SULFATE/DOLUTEGRAVIR SULFATE/LAMIVUDINE 600/50/300 MG TAB
Freq: Every day | ORAL | 0 refills | Status: DC
Start: 2019-03-20 — End: 2019-03-22
  Administered 2019-03-21 – 2019-03-22 (×6): via ORAL

## 2019-03-20 MED ORDER — OXYCODONE 5 MG PO TAB
5-10 mg | ORAL | 0 refills | Status: DC | PRN
Start: 2019-03-20 — End: 2019-03-22
  Administered 2019-03-21 – 2019-03-22 (×4): 10 mg via ORAL

## 2019-03-20 MED ORDER — MORPHINE 2 MG/ML IV SYRG
4 mg | Freq: Once | INTRAVENOUS | 0 refills | Status: CP
Start: 2019-03-20 — End: ?
  Administered 2019-03-21: 01:00:00 4 mg via INTRAVENOUS

## 2019-03-20 MED ORDER — VANCOMYCIN 1G/250ML D5W IVPB (VIAL2BAG)
15 mg/kg | INTRAVENOUS | 0 refills | Status: DC
Start: 2019-03-20 — End: 2019-03-21

## 2019-03-20 MED ORDER — VANCOMYCIN 1,250 MG IVPB
20 mg/kg | Freq: Once | INTRAVENOUS | 0 refills | Status: CP
Start: 2019-03-20 — End: ?
  Administered 2019-03-21 (×2): 1250 mg via INTRAVENOUS

## 2019-03-20 MED ORDER — ENOXAPARIN 40 MG/0.4 ML SC SYRG
40 mg | Freq: Every day | SUBCUTANEOUS | 0 refills | Status: DC
Start: 2019-03-20 — End: 2019-03-22
  Administered 2019-03-21 – 2019-03-22 (×2): 40 mg via SUBCUTANEOUS

## 2019-03-20 MED ORDER — ACETAMINOPHEN 500 MG PO TAB
1000 mg | Freq: Three times a day (TID) | ORAL | 0 refills | Status: DC
Start: 2019-03-20 — End: 2019-03-22
  Administered 2019-03-21 – 2019-03-22 (×4): 1000 mg via ORAL

## 2019-03-20 MED ORDER — VANCOMYCIN PHARMACY TO MANAGE
1 | 0 refills | Status: DC
Start: 2019-03-20 — End: 2019-03-22

## 2019-03-20 MED ORDER — LACTATED RINGERS IV SOLP
30 mL/kg | Freq: Once | INTRAVENOUS | 0 refills | Status: CP
Start: 2019-03-20 — End: ?
  Administered 2019-03-21: 01:00:00 1974 mL via INTRAVENOUS

## 2019-03-20 NOTE — ED Notes
2 RNs attempted to obtain IV access and able to get blood, but no IV access.

## 2019-03-21 LAB — COMPREHENSIVE METABOLIC PANEL
Lab: 1.1 mg/dL (ref 0.4–1.24)
Lab: 10 K/UL — ABNORMAL HIGH (ref 3–12)
Lab: 10 mg/dL — ABNORMAL HIGH (ref 8.5–10.6)
Lab: 13 mg/dL (ref 7–25)
Lab: 131 MMOL/L — ABNORMAL LOW (ref 137–147)
Lab: 19 U/L (ref 7–56)
Lab: 23 MMOL/L (ref 21–30)
Lab: 32 U/L (ref 7–40)
Lab: 5.1 g/dL — ABNORMAL HIGH (ref 3.5–5.0)
Lab: 90 mg/dL (ref 70–100)
Lab: 96 U/L — ABNORMAL LOW (ref 25–110)
Lab: 98 MMOL/L (ref 98–110)
Lab: >60 mL/min (ref >60)
Lab: >60 mL/min — ABNORMAL HIGH (ref >60)

## 2019-03-21 LAB — C REACTIVE PROTEIN (CRP): Lab: 10 mg/dL — ABNORMAL HIGH (ref <1.0)

## 2019-03-21 LAB — COVID-19 (SARS-COV-2) PCR

## 2019-03-21 LAB — SED RATE: Lab: 35 mm/h — ABNORMAL HIGH (ref 0–15)

## 2019-03-21 LAB — MAGNESIUM: Lab: 2.4 mg/dL — ABNORMAL HIGH (ref 1.6–2.6)

## 2019-03-21 MED ORDER — VANCOMYCIN  750 MG IVPB
750 mg | Freq: Two times a day (BID) | INTRAVENOUS | 0 refills | Status: DC
Start: 2019-03-21 — End: 2019-03-22
  Administered 2019-03-21 – 2019-03-22 (×6): 750 mg via INTRAVENOUS

## 2019-03-21 MED ORDER — ONDANSETRON HCL 4 MG PO TAB
4 mg | Freq: Once | ORAL | 0 refills | Status: DC
Start: 2019-03-21 — End: 2019-03-22

## 2019-03-21 NOTE — H&P (View-Only)
Admission History and Physical Examination      Name:  Charles Walls                                             MRN:  [id number]   Admission Date:  03/20/2019                     Assessment/Plan:    Charles Walls is a 49 y.o. male with PMH HIV and meth abuse who presents after injecting into his leg with right lower extremity cellulitis found to be septic.    #Sepsis  #RLE Cellulitis -secondary to IV drug use/injection.  Concern for community MRSA versus other gram-positive organism.  He meets sirs with tachycardia and tachypnea.  Ultrasound completed with no evidence of abscess. LRINEC score 2 (low suspicion for nec fasc both by score and exam).   - Vancomycin  - s/p 39ml/kg for sepsis  - blood cultures pending  -Pain control with scheduled acetaminophen, as needed oxycodone    #Hyponatremia -likely hypovolemic hyponatremia in the setting of sepsis.  -Fluids as above    #Hyperkalemia - mild.  EKG has some peaked T/early repolarization in the precordial leads that is unchanged from EKG in July 2020.  -Fluids as above    #Meth abuse -patient is eager to discontinue drug use.  This is a second ER visit this year for complications related to IV drug abuse.    #HIV -continue home Triumeq    FEN - Regular Diet  DVT PPX - Lovenox  Code Status - DNAR-FI  Disposition - Inpatient  __________________________________________________________________________________  Primary Care Physician: No Pcp, Na     Chief Complaint: Right lower extremity pain  History of Present Illness: Charles Walls is a 49 y.o. male presented with right lower extremity pain.  Patient is an active drug user and injects methamphetamines daily.  Approximately 4 to 5 days ago he was using a vein in his leg.  He met some resistance and knew that he was on the vein but continued to inject regardless.  The following day he noted that he began to have pain in his leg that has since progressed.  He notes swelling, redness and pain in the right lower extremity.  He notes that the pain has worsened and he is nearly unable to walk.  He is also noted subjective fevers at home.  He denies any wounds or drainage.    He has not been on antibiotics for this specific infection.    Of note he was in the ED 02/01/2019 for an upper extremity abscess from injection there as well.  He underwent I&D of his abscess and was discharged with 10 days of clindamycin which she completed.    He notes that he has been compliant with his HIV medications.    He is wanting to discontinue illicit drug use and had an appointment this morning but was unable to make it due to his inability to walk.  Medical History:   Diagnosis Date   ? Fracture cervical vertebra-closed (HCC)     c6, c7 - no surgical repair   ? Human immunodeficiency virus (HIV) disease (HCC)    ? Hyperlipidemia 06/13/2013   ? Psoriasis, unspecified 02/21/2014   ? Suicide attempt [facility])      Surgical History:   Procedure Laterality Date   ?  COLONOSCOPY N/A 06/17/2015    Performed by Jolee Ewing, MD at [facility] ENDO   ? ROBOT ASSISTED LAPAROSCOPIC NEPHRECTOMY PARTIAL WITH INTRAOPERATIVE ULTRASOUND  Left 06/09/2017    Performed by Daryl Eastern, MD at [facility] OR   ? HERNIA REPAIR     ? HX APPENDECTOMY     ? PR LAPAROSCOPY SURG RPR INITIAL INGUINAL HERNIA      right     Family History   Problem Relation Age of Onset   ? Melanoma Father    ? Cancer Father         Social History  Social History     Tobacco Use   ? Smoking status: Current Every Day Smoker     Packs/day: 0.50     Years: 25.00     Pack years: 12.50     Types: Cigarettes     Last attempt to quit: 01/2017     Years since quitting: 2.1   ? Smokeless tobacco: Never Used   ? Tobacco comment: vapeing   Substance Use Topics   ? Alcohol use: Not Currently     Alcohol/week: 0.0 standard drinks   ? Drug use: Yes     Types: Methamphetamines, IV     Comment: IV crystal meth        Allergies:  Sulfa (sulfonamide antibiotics)    Medications: Current Facility-Administered Medications   Medication   ? oxyCODONE (ROXICODONE) tablet 5-10 mg   ? vancomycin (VANCOCIN) 1,000 mg in dextrose 5% (D5W) 250 mL IVPB (Vial2Bag)   ? vancomycin (VANCOCIN) 1,250 mg in sodium chloride 0.9% (NS) 275 mL IVPB   ? vancomycin, pharmacy to manage     Current Outpatient Medications   Medication Sig   ? abacavir-dolutegravir-lamivud (TRIUMEQ) 600-50-300 mg tablet Take one tablet by mouth daily.     Review of Systems:  A comprehensive 14-point ROS was performed and was negative except for right lower extremity pain, swelling, erythema, difficulty walking, subjective fevers    Physical Exam:  Vital Signs: Last Filed In 24 Hours Vital Signs: 24 Hour Range   BP: 115/69 (03/02 1845)  Temp: 36.4 ?C (97.5 ?F) (03/02 1438)  Pulse: 110 (03/02 1846)  Respirations: 22 PER MINUTE (03/02 1846)  SpO2: 100 % (03/02 1846)  SpO2 Pulse: 107 (03/02 1846)  Height: 162.6 cm (64) (03/02 1438) BP: (90-115)/(69-78)   Temp:  [36.4 ?C (97.5 ?F)]   Pulse:  [104-110]   Respirations:  [15 PER MINUTE-22 PER MINUTE]   SpO2:  [94 %-100 %]    Intensity Pain Scale (Self Report): 8 (03/20/19 1441)    General appearance: Lying in bed sleeping comfortably.  In no acute distress  Eyes: PERRL EOMI  Lungs: clear to auscultation bilaterally  Heart: regular rate and rhythm, S1, S2 normal, no murmur, click, rub or gallop  Abdomen: soft, non-tender. Bowel sounds normal.   Extremities: extremities normal, atraumatic, no cyanosis or edema  Musculoskeletal: Moves all extremities equally  Neurologic: Grossly normal, Alert and oriented, normal strength and tone. Normal coordination  Skin: warm, dry, no wounds.  Right lower extremity with nonpitting swelling.  It is warm to touch with confluent erythema on the anterior aspect of the shin and calf with some streaking caudally.  He has palpable and tender lymphadenopathy in the right groin.    Lab/Radiology/Other Diagnostic Tests:  24-hour labs:    Results for orders placed or performed during the hospital encounter of 03/20/19 (from the past 24 hour(s))   POC TROPONIN  Collection Time: 03/20/19  4:39 PM   Result Value Ref Range    Troponin-I-POC 0.00 0.00 - 0.05 NG/ML   POC LACTATE    Collection Time: 03/20/19  4:51 PM   Result Value Ref Range    LACTIC ACID POC 1.7 0.5 - 2.0 MMOL/L   CBC AND DIFF    Collection Time: 03/20/19  5:13 PM   Result Value Ref Range    White Blood Cells 10.9 4.5 - 11.0 K/UL    RBC 4.53 4.4 - 5.5 M/UL    Hemoglobin 14.4 13.5 - 16.5 GM/DL    Hematocrit 16.1 40 - 50 %    MCV 94.4 80 - 100 FL    MCH 31.7 26 - 34 PG    MCHC 33.6 32.0 - 36.0 G/DL    RDW 09.6 (H) 11 - 15 %    Platelet Count 446 (H) 150 - 400 K/UL    MPV 7.3 7 - 11 FL    Neutrophils 67 41 - 77 %    Lymphocytes 20 (L) 24 - 44 %    Monocytes 12 4 - 12 %    Eosinophils 0 0 - 5 %    Basophils 1 0 - 2 %    Absolute Neutrophil Count 7.28 (H) 1.8 - 7.0 K/UL    Absolute Lymph Count 2.24 1.0 - 4.8 K/UL    Absolute Monocyte Count 1.31 (H) 0 - 0.80 K/UL    Absolute Eosinophil Count 0.04 0 - 0.45 K/UL    Absolute Basophil Count 0.09 0 - 0.20 K/UL    MDW (Monocyte Distribution Width) 22.3 (H) <20.7   COMPREHENSIVE METABOLIC PANEL    Collection Time: 03/20/19  5:13 PM   Result Value Ref Range    Sodium 131 (L) 137 - 147 MMOL/L    Potassium 5.2 (H) 3.5 - 5.1 MMOL/L    Chloride 98 98 - 110 MMOL/L    Glucose 90 70 - 100 MG/DL    Blood Urea Nitrogen 13 7 - 25 MG/DL    Creatinine 0.45 0.4 - 1.24 MG/DL    Calcium 40.9 8.5 - 81.1 MG/DL    Total Protein 9.2 (H) 6.0 - 8.0 G/DL    Total Bilirubin 0.6 0.3 - 1.2 MG/DL    Albumin 5.1 (H) 3.5 - 5.0 G/DL    Alk Phosphatase 96 25 - 110 U/L    AST (SGOT) 32 7 - 40 U/L    CO2 23 21 - 30 MMOL/L    ALT (SGPT) 19 7 - 56 U/L    Anion Gap 10 3 - 12    eGFR Non African American >60 >60 mL/min    eGFR African American >60 >60 mL/min   MAGNESIUM    Collection Time: 03/20/19  5:13 PM   Result Value Ref Range    Magnesium 2.4 1.6 - 2.6 mg/dL   BJYNW-29 (SARS-COV-2) PCR    Collection Time: 03/20/19  5:13 PM    Specimen: Nasopharyngeal; Flocked Swab   Result Value Ref Range    COVID-19 (SARS-CoV-2) PCR Source FLOCKED SWAB  NASOPHARYNGEAL       COVID-19 (SARS-CoV-2) PCR NOT DETECTED DN-NOT DETECTED   C REACTIVE PROTEIN (CRP)    Collection Time: 03/20/19  5:13 PM   Result Value Ref Range    C-Reactive Protein 10.39 (H) <1.0 MG/DL   SED RATE    Collection Time: 03/20/19  5:13 PM   Result Value Ref Range    Sed Rate -ESR 35 (H) 0 -  15 MM/HR     Glucose: 90 (03/20/19 1713)  Reviewed the following pertinent radiology image(s):  Ultrasound right lower extremity without DVT and no evidence of abscess.  XR of the right foot showed no acute fracture or dislocation.  Soft tissue swelling along the anterior lateral aspect of the lower leg and ankle to a lesser extent the medial posterior aspect of the mid to lower leg.    I have reviewed chart.    Lisette Grinder, MD  Pager: [phone]  Voalte: Rudean Hitt

## 2019-03-21 NOTE — ED Notes
Patient presents with RLE redness and swelling that began four days ago after he attempted to inject Meth into his leg. Patient endorses pain all the way into his groin with redness and edema in lower half of leg. Patient reports pain is worse when lying still for prolonged periods of time. Patient uses meth everyday and denies other drug use. Patient has previously had the same problem in right upper arm. Patient reports fever the last two days. Patient denies chest pain, SOA, nausea, vomiting, or diarrhea. Patient was placed on full monitor, call light in reach, and bed is low and locked.     Belongings: Patient is taking responsibility for his own belongings.

## 2019-03-22 MED ORDER — SENNOSIDES 8.6 MG PO TAB
2 | Freq: Two times a day (BID) | ORAL | 0 refills | Status: DC | PRN
Start: 2019-03-22 — End: 2019-03-22
  Administered 2019-03-22: 15:00:00 2 via ORAL

## 2019-03-22 MED ORDER — DOXYCYCLINE HYCLATE 100 MG PO TAB
100 mg | Freq: Once | ORAL | 0 refills | Status: CP
Start: 2019-03-22 — End: ?
  Administered 2019-03-22: 18:00:00 100 mg via ORAL

## 2019-03-22 MED ORDER — POLYETHYLENE GLYCOL 3350 17 GRAM PO PWPK
1 | Freq: Two times a day (BID) | ORAL | 0 refills | Status: DC | PRN
Start: 2019-03-22 — End: 2019-03-22
  Administered 2019-03-22: 15:00:00 17 g via ORAL

## 2019-03-22 MED ORDER — DOXYCYCLINE HYCLATE 100 MG PO TAB
100 mg | ORAL_TABLET | Freq: Two times a day (BID) | ORAL | 0 refills | 8.00000 days | Status: DC
Start: 2019-03-22 — End: 2019-08-07

## 2019-03-23 ENCOUNTER — Encounter: Admit: 2019-03-23 | Discharge: 2019-03-23 | Payer: No Typology Code available for payment source

## 2019-03-23 ENCOUNTER — Ambulatory Visit: Admit: 2019-03-23 | Discharge: 2019-03-24 | Payer: No Typology Code available for payment source

## 2019-03-23 DIAGNOSIS — B20 Human immunodeficiency virus [HIV] disease: Secondary | ICD-10-CM

## 2019-03-23 DIAGNOSIS — E785 Hyperlipidemia, unspecified: Secondary | ICD-10-CM

## 2019-03-23 DIAGNOSIS — T1491XA Suicide attempt, initial encounter: Secondary | ICD-10-CM

## 2019-03-23 DIAGNOSIS — L409 Psoriasis, unspecified: Secondary | ICD-10-CM

## 2019-03-23 DIAGNOSIS — S129XXA Fracture of neck, unspecified, initial encounter: Secondary | ICD-10-CM

## 2019-03-23 MED ORDER — BUPROPION XL 150 MG PO TB24
150 mg | ORAL_TABLET | Freq: Every morning | ORAL | 1 refills | Status: DC
Start: 2019-03-23 — End: 2019-06-01

## 2019-03-23 NOTE — Patient Instructions
Local Warm Line (not for crisis):  [facility]            phone: [phone] or [phone] (toll free)  [facility]                         4:00pm - 10:00pm every evening    Local Crisis Lines:      Name: [place] [facility] 24/7                phone:    [phone]      Name: [place] [facility] 24/7                phone:    [phone]      Name: [place] [facility] Crisis Line 24/7            phone:    [phone]    National Suicide Prevention Lifelines & Textlines:      Dial 1-800-273-8255 or 1-800-SUICIDE (784-2433)                    Espaol: 1-888-628-9454     or chat: https://suicidepreventionlifeline.org/chat/     or text:  National Crisis Text Line - Text HOME to 741741     or text:  [place] [place] [facility] - Text HOPE to 20121    Local emergency services:       [facility] - Emergency Department            [address]      [place], [place] [address]                                 phone: [phone]    Or call 911

## 2019-03-24 ENCOUNTER — Encounter: Admit: 2019-03-24 | Discharge: 2019-03-24 | Payer: No Typology Code available for payment source

## 2019-03-24 DIAGNOSIS — F1990 Other psychoactive substance use, unspecified, uncomplicated: Secondary | ICD-10-CM

## 2019-03-24 DIAGNOSIS — F331 Major depressive disorder, recurrent, moderate: Secondary | ICD-10-CM

## 2019-04-26 ENCOUNTER — Encounter: Admit: 2019-04-26 | Discharge: 2019-04-26 | Payer: No Typology Code available for payment source

## 2019-04-26 NOTE — Telephone Encounter
Last ov: 04/18/19  Next ov: none scheduled    Last labs: 03/21/19

## 2019-04-28 MED ORDER — TRIUMEQ 600-50-300 MG PO TAB
ORAL_TABLET | Freq: Every day | 11 refills | Status: AC
Start: 2019-04-28 — End: ?

## 2019-05-03 ENCOUNTER — Encounter: Admit: 2019-05-03 | Discharge: 2019-05-03 | Payer: No Typology Code available for payment source

## 2019-05-04 ENCOUNTER — Encounter: Admit: 2019-05-04 | Discharge: 2019-05-04 | Payer: No Typology Code available for payment source

## 2019-05-04 NOTE — Telephone Encounter
Called patient no answer. Left vm

## 2019-05-28 ENCOUNTER — Encounter: Admit: 2019-05-28 | Discharge: 2019-05-28 | Payer: No Typology Code available for payment source

## 2019-05-28 ENCOUNTER — Emergency Department
Admit: 2019-05-28 | Discharge: 2019-05-29 | Disposition: A | Discharge status: Home / Self Care | Payer: No Typology Code available for payment source

## 2019-05-28 DIAGNOSIS — N4830 Priapism, unspecified: Secondary | ICD-10-CM

## 2019-05-28 LAB — POC BLOOD GAS VEN
Lab: 25 MMOL/L
Lab: 42 mmHg (ref 36–50)
Lab: 7.4 (ref 7.30–7.40)

## 2019-05-28 LAB — POC HEMATOCRIT
Lab: 15 g/dL — ABNORMAL HIGH (ref 13.5–16.5)
Lab: 45 % (ref 40–50)

## 2019-05-28 LAB — BLOOD GASES, PERIPHERAL VENOUS
Lab: 10 MMOL/L
Lab: 10 mmHg — ABNORMAL LOW (ref 33–48)
Lab: 113 mmHg — ABNORMAL HIGH (ref 36–50)
Lab: 17 MMOL/L
Lab: 6.9 — CL (ref 7.30–7.40)
Lab: 9.7 % — ABNORMAL LOW (ref 55–71)

## 2019-05-28 LAB — POC SODIUM: Lab: 139 MMOL/L (ref 137–147)

## 2019-05-28 LAB — POC POTASSIUM: Lab: 3.8 MMOL/L — ABNORMAL HIGH (ref 3.5–5.1)

## 2019-05-28 MED ORDER — LIDOCAINE HCL 10 MG/ML (1 %) IJ SOLN
50 mL | Freq: Once | INTRAMUSCULAR | 0 refills | Status: DC
Start: 2019-05-28 — End: 2019-05-28

## 2019-05-28 MED ORDER — PHENYLEPHRINE 500MCG/ML SYR
Freq: Once | INTRACAVERNOUS | 0 refills | Status: DC | PRN
Start: 2019-05-28 — End: 2019-05-28

## 2019-05-28 MED ORDER — PHENYLEPHRINE 500MCG/ML SYR
100 ug | Freq: Once | INTRACAVERNOUS | 0 refills | Status: CP
Start: 2019-05-28 — End: ?
  Administered 2019-05-28: 21:00:00 200 ug via INTRACAVERNOUS

## 2019-05-28 MED ORDER — PHENYLEPHRINE HCL IN 0.9% NACL 1 MG/10 ML (100 MCG/ML) IV SYRG
100 ug | Freq: Once | INTRAVENOUS | 0 refills | Status: DC
Start: 2019-05-28 — End: 2019-05-28

## 2019-05-28 MED ORDER — LIDOCAINE HCL 10 MG/ML (1 %) IJ SOLN
50 mL | Freq: Once | INTRAMUSCULAR | 0 refills | Status: CP
Start: 2019-05-28 — End: ?
  Administered 2019-05-28: 21:00:00 50 mL via INTRAMUSCULAR

## 2019-05-28 MED ORDER — MORPHINE 2 MG/ML IV SYRG
4 mg | Freq: Once | INTRAVENOUS | 0 refills | Status: CP
Start: 2019-05-28 — End: ?
  Administered 2019-05-28: 20:00:00 4 mg via INTRAVENOUS

## 2019-05-28 NOTE — Progress Notes
Please obtain the following and place at the patient's bedside for urologic procedure. This will greatly help expedite patient's care, thank you for your help! Please page [phone] with questions and/or when medications and supplies available.    ? 50mL vial 1% lidocaine (ordered)  ? 10mL syringe of Phenylephrine 1 mL:500 mcg (ordered)  ? 4mg  IV morphine, hold, to be given just before procedure (ordered)  ? ABG Kit  ? 10mL syringes x2  ? 20mL syringes x2  ? 18 gauge needles x4  ? 27 gauge needles x4  ? Fill needles x2  ? Alcohol swabs x4  ? Small bottle of iodine x1  ? Pack of 4x4s x2  ? Size 7 sterile gloves x2  ? Normal Saline IV Bag  ? Sterile bowl/basin   ? Sterile towels  ? Non-sterile graduated cylinder   ? Chuck Pads x2  ? Face mask w/ eye visor/shield x2  ? Leads for cardiac monitor placed on patient and continuous tele monitor (portable or otherwise) for procedure, and BP cuff set to cycle Q3 minutes

## 2019-05-28 NOTE — Consults
[facility] Consult  05/28/2019     Patient: Charles Walls  MRN: [id number]    Admission Date:  05/28/2019, LOS: 0 days  Admission Diagnosis: No admission diagnoses are documented for this encounter.  Date of Service: May 28, 2019    Reason for Consult: Priapism  Referring Provider: Harless Nakayama, MD  Attending Surgeon: Venetia Maxon, MD  Consult Performed by: Margot Chimes, MD    ASSESSMENT: 49 y.o. 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 At time of examination this afternoon, patient was soft and compressible and without pain. A repeat penile blood gas was obtained which demonstrated: pH 7.40, pCO2 42, pO2 65.    PLAN:  - no further intervention indicated at this point  - recommend observation for 1 hour to ensure patient remains flaccid  - if remains flaccid, OK to discharge  - no follow-up needed from Urology standpoint    Discussed plan of care with staff surgeon, Dr. Lorenz Coaster, who directed plan of care.    Margot Chimes, MD  PGY-5 Urology Resident  Please page Urology on call with any questions   __________________________________________________________________________________    HPI: Charles Walls is a 49 y.o. 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    Patient was hanging out with his friends last night when he was offered to try TriMix and injected this at 2330 on 05/27/2019. He reports developing an erection shortly afterwards and progressively worsening pain since that time. He has had sustained erection since then.    Penile blood gas was obtained and this demonstrated pH 6.91, pCO2 113 and pO2 10. The ED staff then performed bedside aspiration and injection of phenylephrine.    Medical History:   Diagnosis Date   ? Fracture cervical vertebra-closed (HCC)     c6, c7 - no surgical repair ? Human immunodeficiency virus (HIV) disease (HCC)    ? Hyperlipidemia 06/13/2013   ? Psoriasis, unspecified 02/21/2014   ? Suicide attempt [facility])      Surgical History:   Procedure Laterality Date   ? COLONOSCOPY N/A 06/17/2015    Performed by Jolee Ewing, MD at [facility] ENDO   ? ROBOT ASSISTED LAPAROSCOPIC NEPHRECTOMY PARTIAL WITH INTRAOPERATIVE ULTRASOUND  Left 06/09/2017    Performed by Daryl Eastern, MD at [facility] OR   ? HERNIA REPAIR     ? HX APPENDECTOMY     ? PR LAPAROSCOPY SURG RPR INITIAL INGUINAL HERNIA      right     Medications:  No current facility-administered medications on file prior to encounter.      Current Outpatient Medications on File Prior to Encounter   Medication Sig Dispense Refill   ? acetaminophen (TYLENOL) 500 mg tablet Take 500 mg by mouth every 6 hours as needed for Pain. Max of 4,000 mg of acetaminophen in 24 hours.     ? buPROPion XL (WELLBUTRIN XL) 150 mg tablet Take one tablet by mouth every morning. Do not crush or chew.  Indications: major depressive disorder 30 tablet 1   ? doxycycline (VIBRAMYCIN) 100 mg tablet Take one tablet by mouth twice daily. Through 03/30/19 then stop. 18 tablet 0   ? TRIUMEQ 600-50-300 mg tablet TAKE 1 TABLET BY MOUTH DAILY 30 tablet 11     Allergies:  Sulfa (sulfonamide antibiotics)    Social History  Socioeconomic History   ? Marital status: Separated     Spouse name: Not on file   ? Number of children: Not on file   ? Years of education: Not on file   ? Highest education level: Not on file   Occupational History   ? Occupation: unemployed   Tobacco Use   ? Smoking status: Current Every Day Smoker     Packs/day: 0.50     Years: 25.00     Pack years: 12.50     Types: Cigarettes     Last attempt to quit: 01/2017     Years since quitting: 2.3   ? Smokeless tobacco: Never Used   ? Tobacco comment: vapeing   Substance and Sexual Activity   ? Alcohol use: Not Currently     Alcohol/week: 0.0 standard drinks   ? Drug use: Yes     Types: Methamphetamines, IV Comment: IV crystal meth   ? Sexual activity: Yes     Partners: Male   Other Topics Concern   ? Not on file   Social History Narrative    Pagan     Family History   Problem Relation Age of Onset   ? Melanoma Father    ? Cancer Father      Vitals:  Vital Signs: Last Filed In 24 Hours Vital Signs: 24 Hour Range   BP: 129/77 (05/10 1301)  Temp: 36.6 ?C (97.8 ?F) (05/10 1301)  Pulse: 105 (05/10 1301)  Respirations: 18 PER MINUTE (05/10 1301)  SpO2: 98 % (05/10 1301) BP: (129)/(77)   Temp:  [36.6 ?C (97.8 ?F)]   Pulse:  [105]   Respirations:  [18 PER MINUTE]   SpO2:  [98 %]    Intensity Pain Scale (Self Report): 9 (05/28/19 1303)      Intake/Output:  No intake or output data in the 24 hours ending 05/28/19 1731  Physical Exam:   General: Alert, oriented, cooperative, no distress, appears stated age  Head: Normocephalic, without obvious abnormality, atraumatic  Eyes: PERRL, EOMI, no scleral icterus  Neck: Supple, symmetrical, trachea midline  Lungs: Unlabored respirations. Effort normal.  Chest wall: No tenderness or deformity  Heart: Regular rate  Abdomen: Soft, non-tender, non-distended. Bowel sounds normal. Well-healed laparoscopic incisions  GU: circumcised penis with orthotopic meatus; penis is soft and compressible; ecchymosis of the LEFT lateral penis  Skin: Skin color, texture, turgor normal. No rashes or lesionsl  Extremity: No clubbing, cyanosis, or edema  Neurologic: Grossly intact.     ROS:  A comprehensive 12 point review of systems was obtained and is negative except for as noted in the HPI.      Lab/Radiology/Other Diagnostic Tests:  No results for input(s): HGB, HCT, WBC, PLTCT, NA, K, CL, CO2, BUN, CR, GLU, CA, MG, PO4, ALBUMIN, TOTPROT, TOTBILI, AST, ALT, ALKPHOS, AMY, LIPASE, PREALB, INR, PT, PTT in the last 72 hours.

## 2019-05-28 NOTE — ED Notes
Pt is a 49 y.o. male who presents to the ED for a CC of priapism. Pt has a past medical history of Fracture cervical vertebra-closed (HCC), Human immunodeficiency virus (HIV) disease (HCC), Hyperlipidemia (06/13/2013), Psoriasis, unspecified (02/21/2014), and Suicide attempt (HCC).     Pt reports he injected himself with an erectile dysfunction medication last night in which he got from a friend. Pt states then it started to hurt after and wouldn't go down. Pt presents to the ED with a priapism and pain. Pt reports injecting himself around midnight last night. No pain at the injection site. No other complaints. Pt denies hx of priapisms.   Pt AA&Ox4; VSS; NAD; Skin pwd.  Call light within reach, bed in lowest and locked position; oriented to surroundings  Durwin Nora, MD at bedside

## 2019-05-29 NOTE — ED Notes
Report received from [facility] - pt care assumed - pt initially sleeping on cart.      Pt states he feels good and is ready for discharge.  Discharge instructions explained to pt - pt verbalizes understanding.  No questions at this time.  Pt ambulated out of ED with steady gait.

## 2019-06-01 ENCOUNTER — Encounter: Admit: 2019-06-01 | Discharge: 2019-06-01 | Payer: No Typology Code available for payment source

## 2019-06-01 MED ORDER — BUPROPION XL 150 MG PO TB24
150 mg | ORAL_TABLET | Freq: Every morning | ORAL | 0 refills | Status: DC
Start: 2019-06-01 — End: 2019-06-28

## 2019-06-28 ENCOUNTER — Encounter: Admit: 2019-06-28 | Discharge: 2019-06-28 | Payer: No Typology Code available for payment source

## 2019-06-28 ENCOUNTER — Ambulatory Visit: Admit: 2019-06-28 | Discharge: 2019-06-29 | Payer: No Typology Code available for payment source

## 2019-06-28 DIAGNOSIS — S129XXA Fracture of neck, unspecified, initial encounter: Secondary | ICD-10-CM

## 2019-06-28 DIAGNOSIS — B20 Human immunodeficiency virus [HIV] disease: Secondary | ICD-10-CM

## 2019-06-28 DIAGNOSIS — L409 Psoriasis, unspecified: Secondary | ICD-10-CM

## 2019-06-28 DIAGNOSIS — T1491XA Suicide attempt, initial encounter: Secondary | ICD-10-CM

## 2019-06-28 DIAGNOSIS — E785 Hyperlipidemia, unspecified: Secondary | ICD-10-CM

## 2019-06-28 MED ORDER — ARIPIPRAZOLE 5 MG PO TAB
5 mg | ORAL_TABLET | Freq: Every day | ORAL | 0 refills | Status: DC
Start: 2019-06-28 — End: 2019-07-26

## 2019-06-28 MED ORDER — BUPROPION XL 150 MG PO TB24
150 mg | ORAL_TABLET | Freq: Every morning | ORAL | 0 refills | Status: DC
Start: 2019-06-28 — End: 2019-07-26

## 2019-07-26 ENCOUNTER — Encounter: Admit: 2019-07-26 | Discharge: 2019-07-26 | Payer: No Typology Code available for payment source

## 2019-07-26 MED ORDER — ARIPIPRAZOLE 5 MG PO TAB
ORAL_TABLET | Freq: Every day | 0 refills | Status: DC
Start: 2019-07-26 — End: 2019-08-17

## 2019-07-26 MED ORDER — BUPROPION XL 150 MG PO TB24
ORAL_TABLET | Freq: Every morning | 0 refills | Status: DC
Start: 2019-07-26 — End: 2019-08-17

## 2019-07-26 NOTE — Telephone Encounter
Last office visit 06/28/2019  Start abilify 5 mg in addition to wellbutrin 150mg   Upcoming visit 08/17/2019  Medication refilled per protocol

## 2019-08-07 ENCOUNTER — Encounter: Admit: 2019-08-07 | Discharge: 2019-08-07 | Payer: No Typology Code available for payment source

## 2019-08-07 NOTE — Progress Notes
Pharmacy Medication Re-assessment      Summary of Visit    Mr. Pruner returned my call.  He will call scheduler to schedule follow up appt w/ Dr. Colon Flattery for follow up & labs.    No issues w/ Triumeq, no missed doses over past mo.    He has follow up psychiatry appt soon--he feels the abilify is working but he is desiring incr dose on wellbutrin--he will discuss w/ them.    Ibuprofen PRN for hand pain.    A PRN chewable sildenafil tabled pulled thru reconciliation but he stated he never took it as he did not receive it.    He is getting new eye glasses today.      Indication/Regimen     The regimen of ABACAVIR SULFATE/DOLUTEGRAVIR SULFATE/LAMIVUDINE 600/50/300 MG TAB indefinitely is appropriate for Juliann Mule who has HIV (human immunodeficiency virus infection) (HCC).    Renal dose adjustments are not required. Hepatic dose adjustments are not required.   Dose titration is not required.    The patient has the ability to self-administer the medication(s). The patient takes their medications daily.    The patient is on a single tablet regimen.    The patient's regimen was not changed.      Patient is HIV positive. HIV genotype has been reviewed and treatment with select therapy is appropriate. The patient's therapeutic goals are to decrease viral load and increase CD4 count. The patient is making progress toward achieving their therapeutic goal(s). The plan is to continue therapy.    Patient has not completed labs. Labs are due. Laboratory studies will be collected at next scheduled appointment to determined HIV virologic and immunologic response.    Labs and Diagnostic tests:      CD4 Count   Date/Time Value Ref Range Status   03/21/2019 08:32 AM 1,329 440 - 2,160 Final          HIV Viral Load PCR   Date/Time Value Ref Range Status   02/01/2019 02:13 PM <20 RNA COPIES/ML Final     Comment:     Reference range: Undetected  Unit: copies/mL  Result in log copies/mL is <1.30.    HIV-1 RNA is detected, but level present is <20 copies/mL   (<1.30 log copies/mL). This assay cannot accurately   quantify HIV-1 RNA below this level.    -------------------ADDITIONAL INFORMATION-------------------  The quantification range of this assay is 20 to 10,000,000   copies/mL (1.30 log to 7.00 log copies/mL). Testing was   performed using the cobas HIV-1 test [employer] with the cobas 6800 System.  This test has been modified from the manufacturer's   instructions. Its performance characteristics were   determined by [facility] in a manner consistent with CLIA   requirements. This test has not been cleared or approved by   the U.S. Food and Drug Administration.  [facility], [address], [place] [address]          HIV Viral Load: The patients viral load is suppressed.          HBV-related labs:       HBsAg   Date/Time Value Ref Range Status   07/31/2013 12:06 PM NEG  Final          Anti HBc Total   Date Value Ref Range Status   07/31/2013 POS  Final          HCV-related labs:  Anti HCV   Date Value Ref Range Status   03/21/2019 Non-Reactive: Antibodies to HCV were not detected. NRHCV-Non-Reactive: Antibodies to HCV were not detected. Final          [id number] status: negative     Tropism testing: not applicable       Allergies:   Allergies   Allergen Reactions   ? Sulfa (Sulfonamide Antibiotics) SEE COMMENTS     Patient reports that his father and brother are allergic, he is unsure so he reports it as an allergy.         Immunizations:     Vaccine history was reviewed with the patient. The patient was educated on the importance of completing vaccines.    Immunization History   Administered Date(s) Administered   ? Flu Vaccine =>65 YO High-Dose (PF) 02/11/2016, 11/24/2016   ? Flu Vaccine =>65 YO High-Dose Quadrivalent (PF) 02/07/2019   ? Flu Vaccine Trivalent =>3 Yo (Preservative Free) 10/04/2013   ? Hepatitis B Vaccine Adult 3 Dose IM 10/01/2013   ? Meningococcal Conjug Vaccine IM (MenACWY-CRM)(Menveo) 06/18/2015, 02/11/2016   ? Pneumococcal Vaccine (23-Val Adult) 10/01/2013   ? Pneumococcal Vaccine(13-Val Peds/immunocompromised adult) 07/31/2013   ? Tdap Vaccine 05/11/2013       Medication Reconciliation:  Medication history and reconciliation were performed (including prescription medications, supplements, over the counter, and herbal products). The medication list was updated and the patient's current medication list is included below.     Home Medications    Medication Sig   ARIPiprazole (ABILIFY) 5 mg tablet TAKE 1 TABLET BY MOUTH DAILY   buPROPion XL (WELLBUTRIN XL) 150 mg tablet TAKE 1 TABLET BY MOUTH EVERY MORNING. DO NOT CRUSH OR CHEW.   ibuprofen (ADVIL) 200 mg tablet Take 200 mg by mouth every 6 hours as needed for Pain. Take with food.   TRIUMEQ 600-50-300 mg tablet TAKE 1 TABLET BY MOUTH DAILY     The patient was reminded to speak with their health care provider before starting any new drug, including prescription or over the counter, natural / herbal products, or vitamins.    Drug Interactions:      Drug-Food Interactions:  Drug-Food interactions were evaluated. There are no significant drug-food interactions. This medication should be taken with or without food    Adverse Drug Events:     Adverse drug events were reviewed with the patient.  Significant adverse drug event were not identified.    Adherence:     Refill and adherence history were reviewed with the patient. The patient was educated on the importance of adherence.  Patient is meeting refill adherence goal: Yes.  Patient is adherent with refills: Yes.    Patient reported 0 missed doses over the past 1 month.   Patient is meeting reported adherence goal.    Safety Precautions:     Risk Evaluation and Mitigation (REMS) Assessment:  REMS is not required for this medication.    Safety precautions were addressed and discussed with the patient as applicable.    Contraindications: Chrishun Scheer does not have contraindications to this medication.      Pregnancy Status: Male, education not applicable.      Follow up Plan:    The patient was not counseled because other(No questions, plans to cont on his Triumeq).    Juliann Mule was given the opportunity to ask questions and did not have any questions at the time.    The patient will be reassessed within 1 year.  Based on the patient's preference the medication(s) will be shipped from outside pharmacy.    The patient does not have a follow-up appointment, transferred to scheduler.    Additional Information:  Outpatient Pharmacy: [facility]  Insurance: [place], [place] ADAP  Juanell Fairly Program involvement: Anthoney Harada is his case mgr    Janan Ridge, [facility]  [facility]

## 2019-08-07 NOTE — Telephone Encounter
Attempted to call Juliann Mule to verify compliance and assess tolerance of his specialty medications (Triumeq). No answer. Left voicemail asking patient to return call to pharmacist at [phone].  Sent mychart msg.    He missed follow up appt w/ Dr. Colon Flattery and currently does not have future follow up appt scheduled.    Janan Ridge, PharmD, BCPS, AAHIVP  [phone]

## 2019-08-09 ENCOUNTER — Emergency Department: Admit: 2019-08-09 | Discharge: 2019-08-09 | Payer: No Typology Code available for payment source

## 2019-08-09 ENCOUNTER — Encounter: Admit: 2019-08-09 | Discharge: 2019-08-09 | Payer: No Typology Code available for payment source

## 2019-08-09 DIAGNOSIS — S129XXA Fracture of neck, unspecified, initial encounter: Secondary | ICD-10-CM

## 2019-08-09 DIAGNOSIS — B20 Human immunodeficiency virus [HIV] disease: Secondary | ICD-10-CM

## 2019-08-09 DIAGNOSIS — L409 Psoriasis, unspecified: Secondary | ICD-10-CM

## 2019-08-09 DIAGNOSIS — E785 Hyperlipidemia, unspecified: Secondary | ICD-10-CM

## 2019-08-09 DIAGNOSIS — T1491XA Suicide attempt, initial encounter: Secondary | ICD-10-CM

## 2019-08-09 DIAGNOSIS — S61219A Laceration without foreign body of unspecified finger without damage to nail, initial encounter: Secondary | ICD-10-CM

## 2019-08-09 MED ORDER — DIPHTH,PERTUS(ACELL),TETANUS 2.5-8-5 LF-MCG-LF/0.5ML IM SUSP
.5 mL | Freq: Once | INTRAMUSCULAR | 0 refills | Status: CP
Start: 2019-08-09 — End: ?
  Administered 2019-08-09: 18:00:00 0.5 mL via INTRAMUSCULAR

## 2019-08-09 MED ORDER — CEPHALEXIN 500 MG PO CAP
500 mg | ORAL_CAPSULE | Freq: Four times a day (QID) | ORAL | 0 refills | Status: AC
Start: 2019-08-09 — End: ?

## 2019-08-09 MED ORDER — LIDOCAINE HCL 10 MG/ML (1 %) IJ SOLN
20 mL | Freq: Once | INTRAMUSCULAR | 0 refills | Status: CP
Start: 2019-08-09 — End: ?
  Administered 2019-08-09: 18:00:00 20 mL via INTRAMUSCULAR

## 2019-08-09 MED ORDER — BACITRACIN ZINC 500 UNIT/GRAM TP OINT
Freq: Once | TOPICAL | 0 refills | Status: CP
Start: 2019-08-09 — End: ?
  Administered 2019-08-09: 19:00:00 via TOPICAL

## 2019-08-09 MED ORDER — SODIUM CHLORIDE 0.9 % IR SOLN
Freq: Once | 0 refills | Status: CP
Start: 2019-08-09 — End: ?
  Administered 2019-08-09: 18:00:00 1000.000 mL

## 2019-08-09 NOTE — ED Notes
Pt verbalized understanding of d/c instructions and rx and all questions answered. Pt alert and oriented x4 in no signs of distress. Pt ambulated out of ED with steady gait.

## 2019-08-09 NOTE — ED Notes
Pt presents to the ED with complaints of lac on right middle finger. Bleeding stopped at this time. A&Ox3. Skin warm, dry, intact. Resp even and unlabored.

## 2019-08-17 ENCOUNTER — Ambulatory Visit: Admit: 2019-08-17 | Discharge: 2019-08-17 | Payer: No Typology Code available for payment source

## 2019-08-17 ENCOUNTER — Encounter: Admit: 2019-08-17 | Discharge: 2019-08-17 | Payer: No Typology Code available for payment source

## 2019-08-17 DIAGNOSIS — B20 Human immunodeficiency virus [HIV] disease: Secondary | ICD-10-CM

## 2019-08-17 DIAGNOSIS — S129XXA Fracture of neck, unspecified, initial encounter: Secondary | ICD-10-CM

## 2019-08-17 DIAGNOSIS — T1491XA Suicide attempt, initial encounter: Secondary | ICD-10-CM

## 2019-08-17 DIAGNOSIS — Z79899 Other long term (current) drug therapy: Secondary | ICD-10-CM

## 2019-08-17 DIAGNOSIS — E785 Hyperlipidemia, unspecified: Secondary | ICD-10-CM

## 2019-08-17 DIAGNOSIS — F1510 Other stimulant abuse, uncomplicated: Secondary | ICD-10-CM

## 2019-08-17 DIAGNOSIS — F3181 Bipolar II disorder: Secondary | ICD-10-CM

## 2019-08-17 DIAGNOSIS — F4323 Adjustment disorder with mixed anxiety and depressed mood: Secondary | ICD-10-CM

## 2019-08-17 DIAGNOSIS — L409 Psoriasis, unspecified: Secondary | ICD-10-CM

## 2019-08-17 LAB — CD4 ABSOLUTE CELL COUNT
Lab: 100 mg/dL (ref 440–2160)
Lab: 42 % — ABNORMAL HIGH (ref 28–63)

## 2019-08-17 LAB — LIPID PROFILE: Lab: 180 mg/dL (ref <200)

## 2019-08-17 LAB — HEMOGLOBIN A1C: Lab: 5.5 % — CL (ref 4.0–6.0)

## 2019-08-17 MED ORDER — ARIPIPRAZOLE 10 MG PO TAB
10 mg | ORAL_TABLET | Freq: Every day | ORAL | 0 refills | Status: AC
Start: 2019-08-17 — End: ?

## 2019-08-17 NOTE — Patient Instructions
Local Warm Line (not for crisis):  [facility]            phone: [phone] or [phone] (toll free)  [facility]                         4:00pm - 10:00pm every evening    Local Crisis Lines:      Name: [place] [facility] 24/7                phone:    [phone]      Name: [place] [facility] 24/7                phone:    [phone]      Name: [place] [facility] Crisis Line 24/7            phone:    [phone]    National Suicide Prevention Lifelines & Textlines:      Dial 1-800-273-8255 or 1-800-SUICIDE (784-2433)                    Espa?ol: 1-888-628-9454     or chat: https://suicidepreventionlifeline.org/chat/     or text:  National Crisis Text Line - Text HOME to 741741     or text:  [facility] - Text HOPE to 20121    Local emergency services:       [facility] - Emergency Department            [address]      [place], [place] [address]                                 phone: [phone]    Or call 911

## 2019-08-17 NOTE — Progress Notes
I have discussed this patient's care with the resident and concur with content written but did not personally see and exam the patient today.  Tammera Engert A Yola Paradiso, DO

## 2019-08-22 ENCOUNTER — Encounter: Admit: 2019-08-22 | Discharge: 2019-08-22 | Payer: No Typology Code available for payment source

## 2019-08-23 ENCOUNTER — Encounter: Admit: 2019-08-23 | Discharge: 2019-08-23 | Payer: No Typology Code available for payment source

## 2019-09-01 ENCOUNTER — Encounter: Admit: 2019-09-01 | Discharge: 2019-09-01 | Payer: No Typology Code available for payment source

## 2019-09-01 MED ORDER — BUPROPION XL 150 MG PO TB24
ORAL_TABLET | Freq: Every morning | 0 refills
Start: 2019-09-01 — End: ?

## 2019-09-06 ENCOUNTER — Encounter: Admit: 2019-09-06 | Discharge: 2019-09-06 | Payer: No Typology Code available for payment source

## 2019-09-18 ENCOUNTER — Encounter: Admit: 2019-09-18 | Discharge: 2019-09-18 | Payer: No Typology Code available for payment source

## 2019-09-18 DIAGNOSIS — Z23 Encounter for immunization: Secondary | ICD-10-CM

## 2019-09-26 ENCOUNTER — Encounter: Admit: 2019-09-26 | Discharge: 2019-09-26 | Payer: No Typology Code available for payment source

## 2019-09-26 MED ORDER — ARIPIPRAZOLE 10 MG PO TAB
ORAL_TABLET | Freq: Every day | 2 refills | Status: AC
Start: 2019-09-26 — End: ?

## 2019-10-05 ENCOUNTER — Encounter: Admit: 2019-10-05 | Discharge: 2019-10-05 | Payer: No Typology Code available for payment source

## 2019-10-05 ENCOUNTER — Ambulatory Visit: Admit: 2019-10-05 | Discharge: 2019-10-06 | Payer: No Typology Code available for payment source

## 2019-10-05 DIAGNOSIS — F3181 Bipolar II disorder: Secondary | ICD-10-CM

## 2019-10-05 DIAGNOSIS — L409 Psoriasis, unspecified: Secondary | ICD-10-CM

## 2019-10-05 DIAGNOSIS — E785 Hyperlipidemia, unspecified: Secondary | ICD-10-CM

## 2019-10-05 DIAGNOSIS — B20 Human immunodeficiency virus [HIV] disease: Secondary | ICD-10-CM

## 2019-10-05 DIAGNOSIS — Z79899 Other long term (current) drug therapy: Secondary | ICD-10-CM

## 2019-10-05 DIAGNOSIS — F1510 Other stimulant abuse, uncomplicated: Secondary | ICD-10-CM

## 2019-10-05 DIAGNOSIS — T1491XA Suicide attempt, initial encounter: Secondary | ICD-10-CM

## 2019-10-05 DIAGNOSIS — F4323 Adjustment disorder with mixed anxiety and depressed mood: Secondary | ICD-10-CM

## 2019-10-05 DIAGNOSIS — S129XXA Fracture of neck, unspecified, initial encounter: Secondary | ICD-10-CM

## 2019-10-05 DIAGNOSIS — K219 Gastro-esophageal reflux disease without esophagitis: Secondary | ICD-10-CM

## 2019-10-05 MED ORDER — OMEPRAZOLE 40 MG PO CPDR
40 mg | ORAL_CAPSULE | Freq: Every day | ORAL | 0 refills | Status: AC
Start: 2019-10-05 — End: ?

## 2019-10-05 MED ORDER — SALIVA STIMULANT COMB. NO.3 MM SPRY
1 | OROMUCOSAL | 1 refills | 30.00000 days | Status: AC | PRN
Start: 2019-10-05 — End: ?

## 2019-10-05 NOTE — Progress Notes
Obtained patient's verbal consent to treat via this telehealth visit during the [facility] Emergency, consistent with [place] financial policy.    Start time: 0805  End time: 8 30  Additional time spent on encounter:       Subjective:      Charles Walls is a 49 y.o. male with a  has a past medical history of Fracture cervical vertebra-closed (HCC), Human immunodeficiency virus (HIV) disease (HCC), Hyperlipidemia (06/13/2013), Psoriasis, unspecified (02/21/2014), and Suicide attempt (HCC). 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  Medical issues HIV.  Medications included: Abilify increased to 10 mg       History of Present Illness  Since him last visit, overall things have been sleeping 3-4 hrs at night.   wakes ups several times in the middle of the night.  Having some nocturia 3-4x.   Wakes up tired.  Going to bed between 9-11p and wakes up between 8-9a.  Has dry mouth and is always drinking. Mood is pretty even keel  No fights with his ex.  Hasn't seen him in over 2 months.  Got the dog in custody.  No meth use.  Having some cravings.  Doing some puzzle books.  Thoughts are still going fast.  Some irritability but overall better.  No spending sprees or risky behaviors. Concentration and focus could be better. No anhedonia.  Things about 60-65% better.  Sleep is what is keeping him from being 100% remission.  This way for 6 weeks (since he started work).      Medical history update:   Bad breath in the morning and some abdominal pain  Epigastric.  Worse when he doesn't eat.      Social History Update:  - work is going good.    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    - tea and coffee    Review of Systems   Constitutional: Positive for fatigue. Negative for unexpected weight change.   Respiratory: Negative for shortness of breath. Cardiovascular: Negative for chest pain.   Gastrointestinal: Positive for abdominal pain. Negative for constipation and diarrhea.   Neurological: Negative for tremors.   Psychiatric/Behavioral: Positive for sleep disturbance. Negative for hallucinations and suicidal ideas. The patient is not nervous/anxious.        Objective:         ? ARIPiprazole (ABILIFY) 10 mg tablet TAKE 1 TABLET BY MOUTH DAILY   ? omeprazole DR (PRILOSEC) 40 mg capsule Take one capsule by mouth daily before breakfast.   ? saliva stimulant agents Comb.3 (BIOTENE MOISTURIZING) mouth spray Take one spray by mouth or throat as directed every 1 hour as needed.   ? TRIUMEQ 600-50-300 mg tablet TAKE 1 TABLET BY MOUTH DAILY     There were no vitals filed for this visit.  There is no height or weight on file to calculate BMI.   Ht:5'5  Wt:145 lbs    Mental Status Exam:  This visit was conducted over Psychologist, prison and probation services.    ? General/Constitutional:49 y.o.  male   appears stated age, wearing street clothes, good eye contact   ? Speech/Motor: Regular rate, rhythm, tone, and volume; good articulation / no evidence of psychomotor agitation or retardation  ? Mood/Affect: Even/euthymic overall mood congruent  ? Thought Process: Linear, goal-directed  ? Associations: Intact  ? Thought  Content: denies SI with no plan, denies HI  ? Perception: Denies AVH, denies delusions  ? Insight/Judgment: Good good  ? Orientation: Full    ? Recent and Remote Memory: grossly intact  ? Attention span and concentration: appropriate for conversation  ? Language: English, fluent  ? Fund of knowledge and vocabulary:  Average    Focused Physical Exam:  ? Musculoskeletal: Moves all 4 extremities spontaneously  ? Neuro: no gross deficits         Metabolic monitoring:  Metabolic monitoring:     There is no height or weight on file to calculate BMI.  Wt Readings from Last 3 Encounters:   08/17/19 67.6 kg (149 lb)   08/09/19 65.8 kg (145 lb)   06/28/19 65.8 kg (145 lb) BP Readings from Last 3 Encounters:   08/17/19 114/69   08/09/19 106/61   05/28/19 131/88     Lab Results   Component Value Date    CHOL 180 08/17/2019    TRIG 162 (H) 08/17/2019    HDL 36 (L) 08/17/2019    LDL 122 (H) 08/17/2019    VLDL 32 08/17/2019    NONHDLCHOL 144 08/17/2019     Hemoglobin A1C   Date Value Ref Range Status   08/17/2019 5.5 4.0 - 6.0 % Final     Comment:     The ADA recommends that most patients with type 1 and type 2 diabetes maintain   an A1c level <7%.         AIMS  AIMS Score (Calculated): 0 (08/17/2019 12:00 PM)      Medication trials and response:      Assessment and Plan:  49 y.o. male overall appears to be doing much better outside of continued sleep disturbance.  This seems to be in part related to dry mouth and significant water intake in the evenings.  As this is a new symptom since starting the Abilify we will recheck hemoglobin A1c.  Due to cholesterol changes with last lipid profile will recheck.  He says he will get these labs this afternoon.  If metabolic labs worsen we discussed transitioning to Lamictal.. We addressed the following concerns today:         Problem   Gastroesophageal Reflux Disease Without Esophagitis    Ordered omeprazole 40mg  qday before breakfast.  Discussed lifestyle modifications. No current red flag symptoms.  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  Last used 2 months ago     Bipolar 2 Disorder (Hcc)    Mixed symptoms in the past 60% better since august 2021.  Sleep and fatigue need to be improved  No safety concerns.  Past trials: Wellbutrin as this has been ineffective  Plan:  Increase Abilify to 10 mg daily.  Discussed L AI which he is open to.  discussed transitioning to Lamictal.  Previously discussed potential side effects.  But will review with him again if we do ultimately make this adjustment.     Adjustment Disorder With Mixed Anxiety and Depressed Mood (Resolved)    Related to divorce and continued aggressive behavior by ask.  Still ongoing.  Increase Abilify to 10 mg        Biotene spray for dry mouth.  Recommended no fluid intake after dinner  Labs ordered hemoglobin A1c and lipid profile and bmp  Return to clinic in 2 month    Patient seen  and discussed with Dr. Hilma Favors    Safety plan discussed at length and outlined in detail on after visit summary

## 2019-11-21 ENCOUNTER — Encounter: Admit: 2019-11-21 | Discharge: 2019-11-21 | Payer: No Typology Code available for payment source

## 2019-11-22 ENCOUNTER — Encounter: Admit: 2019-11-22 | Discharge: 2019-11-22 | Payer: No Typology Code available for payment source

## 2019-12-08 IMAGING — CT Abdomen^1_ABDOMEN_PELVIS_WITH (Adult)
1 series · 15 of 32 positions shown, 19 images · IV contrast (APPLIED)
Comparison: none

[Series 2: abd/pelvis with 5.0 soft tissue · axial · 0.63mm/px · z∈[-460,-55]mm · 15 of 88 slices shown, 19 images]
[im 6/88  soft-tissue]
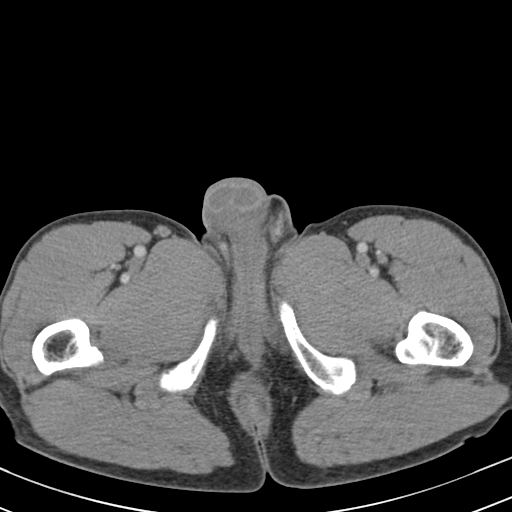
[im 6/88  bone]
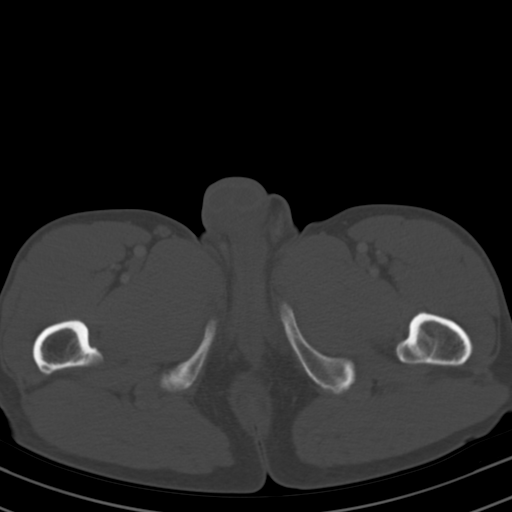
[im 12/88  soft-tissue]
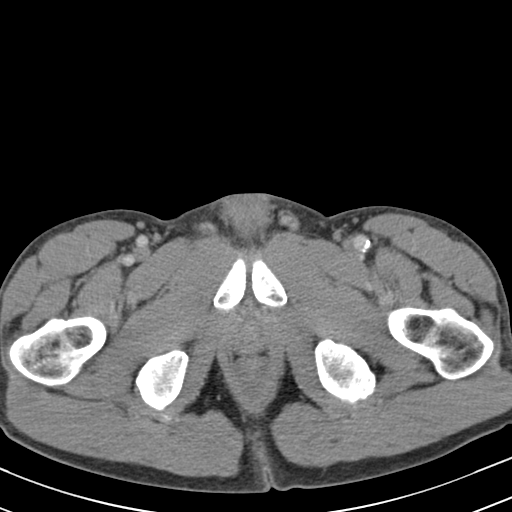
[im 17/88  soft-tissue]
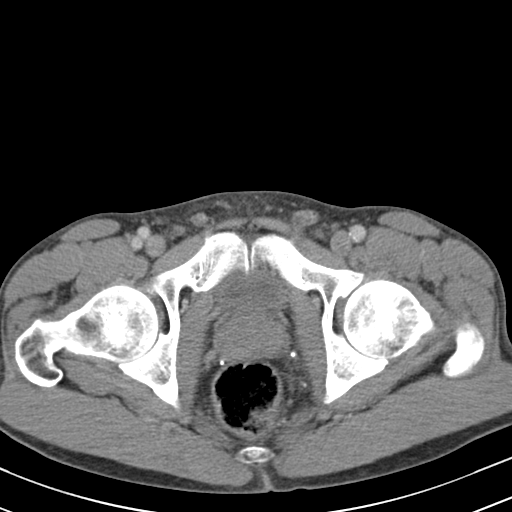
[im 26/88  soft-tissue]
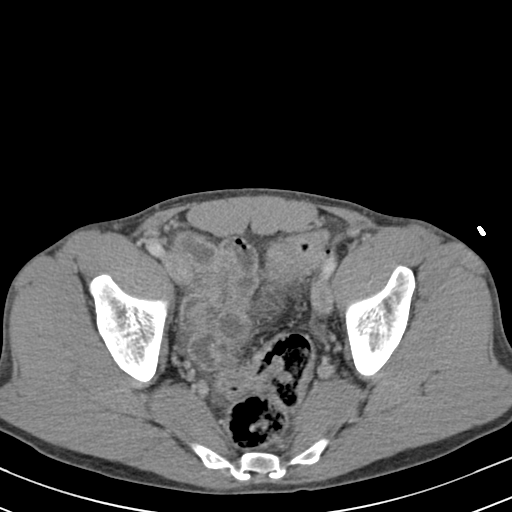
[im 31/88  soft-tissue]
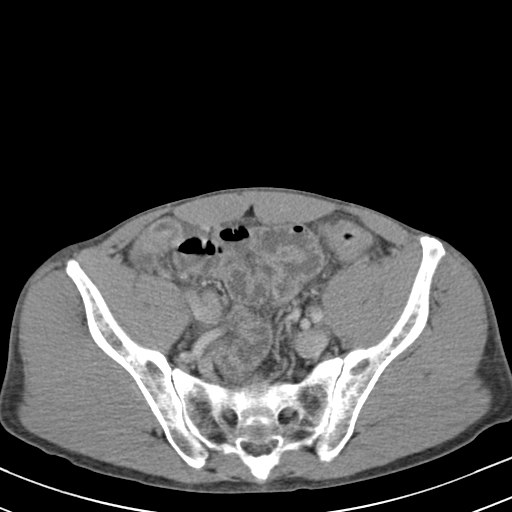
[im 37/88  soft-tissue]
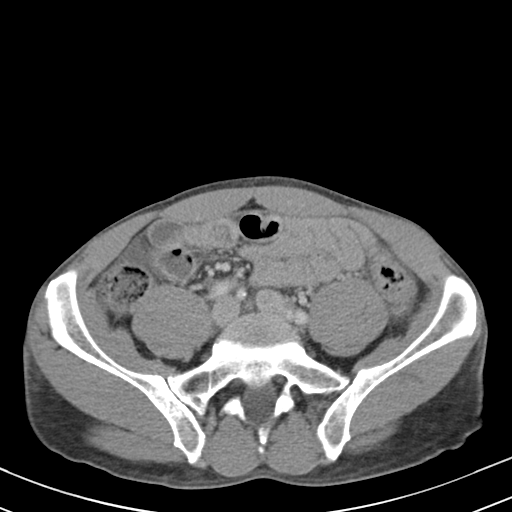
[im 45/88  soft-tissue]
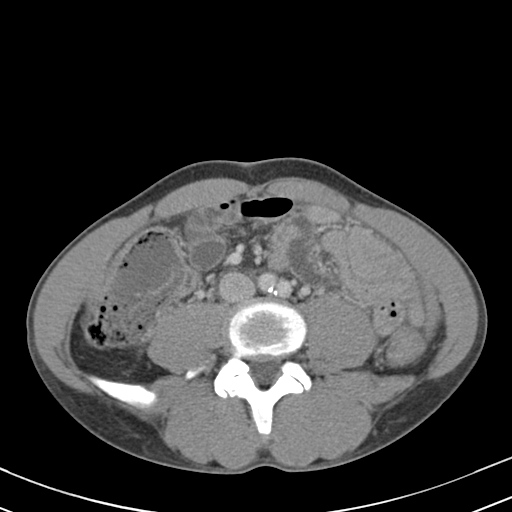
[im 51/88  soft-tissue]
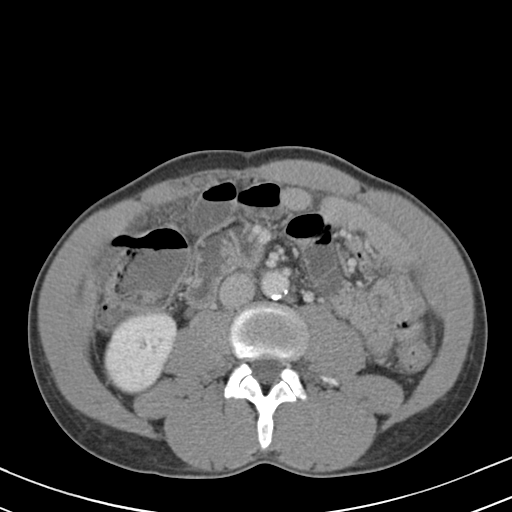
[im 57/88  soft-tissue]
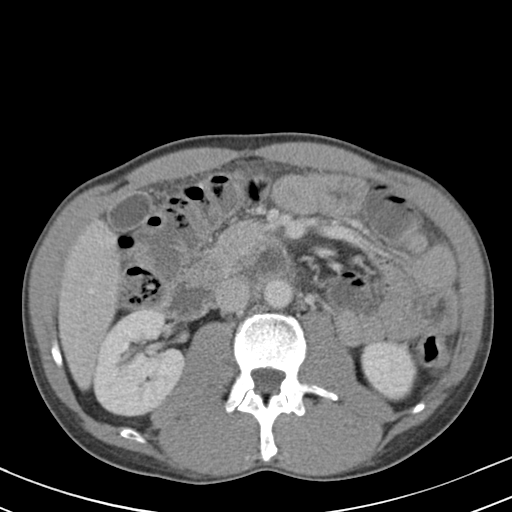
[im 57/88  bone]
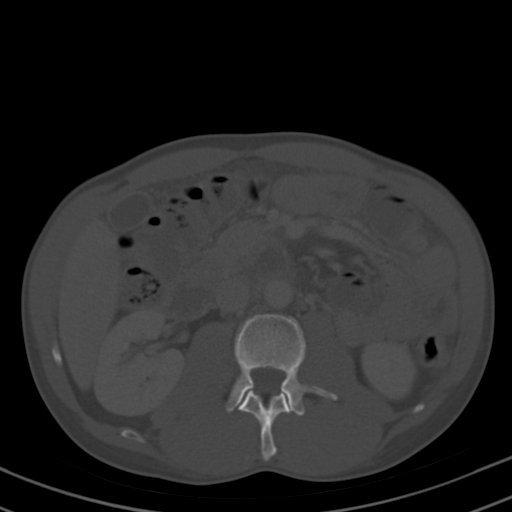
[im 62/88  soft-tissue]
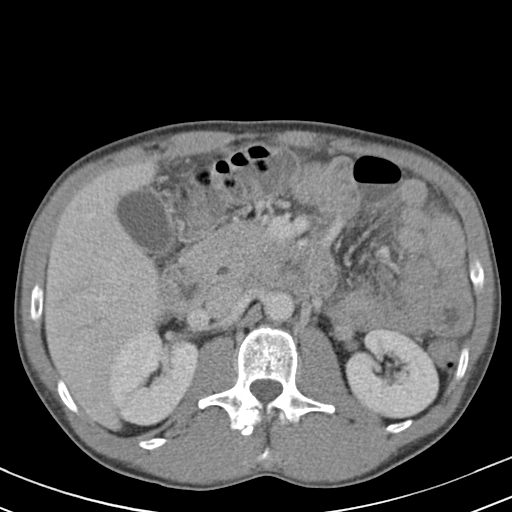
[im 71/88  soft-tissue]
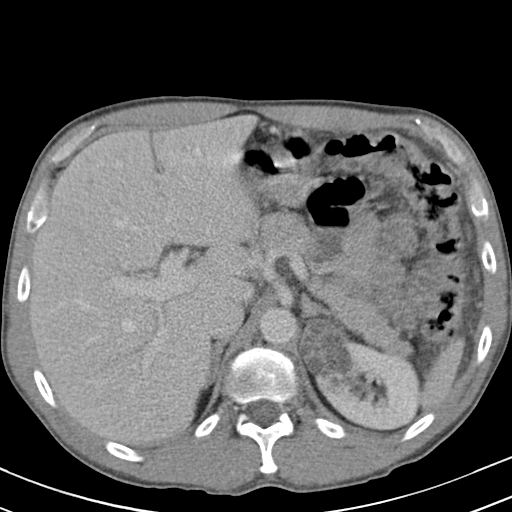
[im 76/88  soft-tissue]
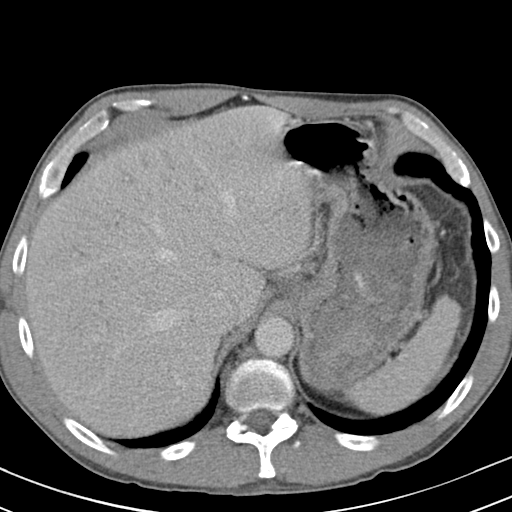
[im 76/88  lung]
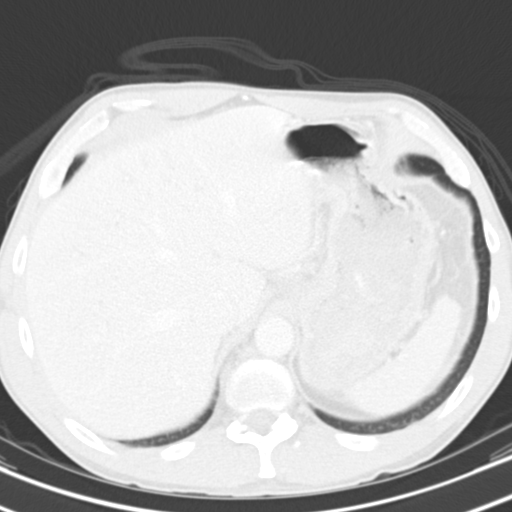
[im 79/88  lung]
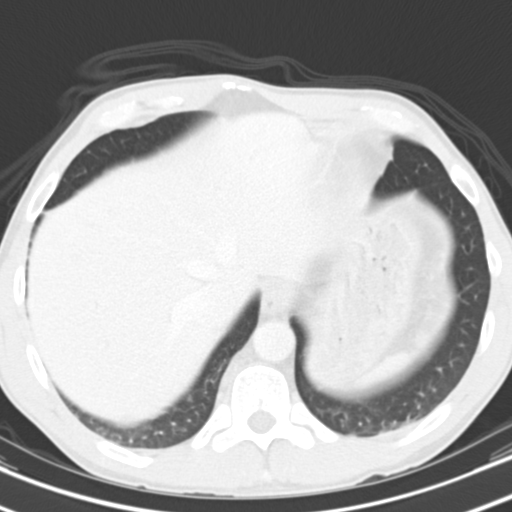
[im 82/88  soft-tissue]
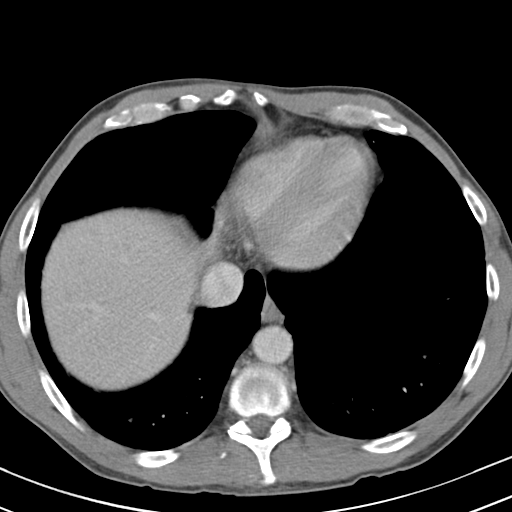
[im 82/88  lung]
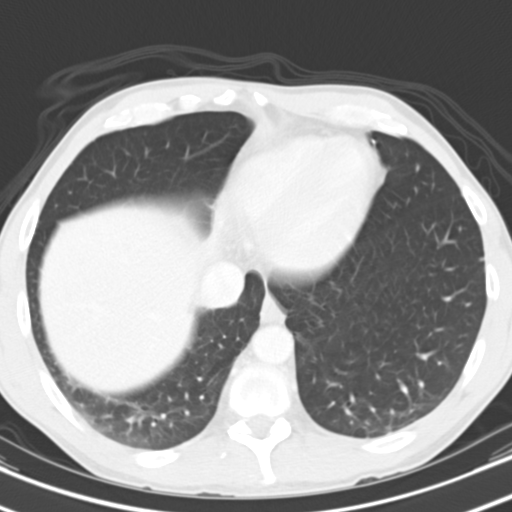
[im 85/88  lung]
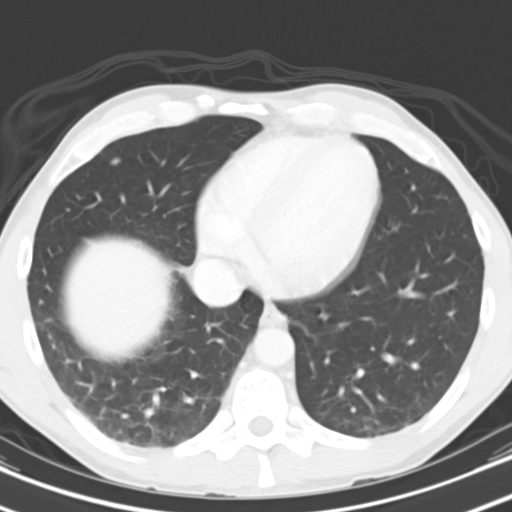

[15 of 32 positions shown; findings below may reference images not displayed]

EXAM
COMPUTED TOMOGRAPHY, ABDOMEN WITH CONTRAST MATERIAL CPT 44569

INDICATION
mid abd pain hx divertic
ABD/PELVIC PAIN LT>RT. UNABLE TO STAND UPRIGHT AT TIMES. NAUSEA. H/O
DIVERTICULITIS, APPY. HB

TECHNIQUE
CT evaluation of the abdomen was performed after the administration of oral and588 cc
Isovueintravenous contrast. Sagittal and coronal reconstructed images were generated.
[All CT scans at this facility use dose modulation, iterative reconstruction, and/or weight based
dosing when appropriate to reduce radiation dose to as low as reasonably achievable.]

COMPARISONS
None

FINDINGS
The heart size is normal. No pericardial effusion. Lung bases demonstrate mild right basilar
atelectasis.
No focal lesions liver, spleen, pancreas, adrenals. There is no abdominal aortic aneurysm. There
is no retroperitoneal adenopathy. Kidneys excrete contrast bilaterally. There is a low-density
lesion in the upper pole of the medial left kidney is most consistent with renal cell carcinoma.
This measures 3.3 centimeters on coronal image 36. Is also best seen on axial image 17. There are
small bilateral renal cysts. There is no stone or hydronephrosis.
Exam limited in the absence of oral contrast. No bowel obstruction or free air. No inflammatory
process right lower quadrant. Patient is post appendectomy. There fluid-filled small bowel loops.
There is diverticulosis with diverticulitis seen in the sigmoid colon. There is thickening of the
sigmoid colon with pericolonic inflammatory changes. This is best seen on axial image 67. Bladder
is fluid filled. There is a trace free fluid in the pelvis. Prostate not enlarged. Pelvic
calcifications consistent with phleboliths. No inguinal adenopathy. Bone windows demonstrate spinal
degenerative changes

IMPRESSION
Findings most consistent with diverticulitis in the sigmoid colon. There is no free air. There is
no abscess. Recommend follow-up films after appropriate treatment. There is associated pericolonic
inflammatory changes a small amount of free fluid.
Findings most consistent with a renal cell carcinoma in the left kidney. MRI could further
characterize if indicated. This will likely need to be removed

## 2019-12-13 ENCOUNTER — Encounter: Admit: 2019-12-13 | Discharge: 2019-12-13 | Payer: No Typology Code available for payment source

## 2019-12-20 ENCOUNTER — Encounter: Admit: 2019-12-20 | Discharge: 2019-12-20 | Payer: No Typology Code available for payment source

## 2019-12-20 NOTE — Telephone Encounter
Attempted to contact patient and no answer. Please reschedule.

## 2020-02-07 ENCOUNTER — Encounter: Admit: 2020-02-07 | Discharge: 2020-02-07 | Payer: No Typology Code available for payment source

## 2020-03-25 ENCOUNTER — Encounter: Admit: 2020-03-25 | Discharge: 2020-03-25 | Payer: No Typology Code available for payment source

## 2020-03-25 NOTE — Telephone Encounter
Charles Walls called and left message stating that he has something on his lip and needs an appointment for labs. Sent lab request to provider.    Called Charles Walls back to discuss lip concern. Got voicemail, recommended he go to urgent care to be evaluated to see if he needs to be on medication.

## 2020-04-03 ENCOUNTER — Encounter: Admit: 2020-04-03 | Discharge: 2020-04-03 | Payer: No Typology Code available for payment source

## 2020-04-03 NOTE — Telephone Encounter
Charles Walls calls in states that he noticed yesterday while he was shaving that he has a hard lump under his chin in "the horseshoe area" and wants to know who he should see, if he should go to dermatology.    Recommended he go to urgent care for evaluation and they can determine if there is an infection that needs treatment, or if it is a skin condition that needs a dermatologist.

## 2020-04-08 ENCOUNTER — Ambulatory Visit: Admit: 2020-04-08 | Discharge: 2020-04-09 | Payer: No Typology Code available for payment source

## 2020-04-08 DIAGNOSIS — 1 ERRONEOUS ENCOUNTER--DISREGARD: Secondary | ICD-10-CM

## 2020-04-08 NOTE — Progress Notes
Patient did not connect

## 2020-04-10 ENCOUNTER — Encounter: Admit: 2020-04-10 | Discharge: 2020-04-10 | Payer: No Typology Code available for payment source

## 2020-04-21 NOTE — Progress Notes
Date of Service: 04/22/2020    Charles Walls is a 50 y.o. male here for an annual physical.  Chief Complaint   Patient presents with   ? Establish Care     Patient has not seen a PCP in about 5 years - See's [facility]   ? Physical   ? Labs Only     Not fasting    ? Imm/Inj     Shingrix - - pt notified and will check with insurance    ? Other     PHQ9 score    ? Lump     Onset 4 weeks. On chin.  Tender to the touch sometimes.         Charles Walls presents as a NEW PATIENT.    Subjective:               History of Present Illness  DDS visits: Biannually.  Eye exams: Annually.   Exercise: Works an active job on his feet  Diet: Regular    Patient has the following concerns today:     Spot on chin: First noted about 4-5 weeks ago. States there is a lump that has increased in size. Mildly tender. No drainage or skin changes. Has had a fever blister for about 2 months that has been difficult to get rid of.     Chronic conditions:    HIV: Sees [facility].    Bipolar 2: Sees [facility]. Takes Abilify. Seen every 2 months.    Renal carcinoma: Diagnosed in 2019. He underwent a partial nephrectomy. No radiation or chemotherapy indicated. Sees [facility], Dr Marshell Garfinkel every 6 months.      Medical History:   Diagnosis Date   ? Diverticulitis 02/02/2017   ? Fracture cervical vertebra-closed (HCC)     c6, c7 - no surgical repair   ? Gastroesophageal reflux disease without esophagitis 10/05/2019    Ordered omeprazole 40mg  qday before breakfast.  Discussed lifestyle modifications. No current red flag symptoms.  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 Human immunodeficiency virus (HIV) disease (HCC)    ? Hyperlipidemia 06/13/2013   ? Psoriasis, unspecified 02/21/2014   ? Suicide attempt [facility])      Surgical History:   Procedure Laterality Date   ? COLONOSCOPY N/A 06/17/2015    Performed by Jolee Ewing, MD at [facility] ENDO   ? ROBOT ASSISTED LAPAROSCOPIC NEPHRECTOMY PARTIAL WITH INTRAOPERATIVE ULTRASOUND  Left 06/09/2017    Performed by Daryl Eastern, MD at [facility] OR   ? HERNIA REPAIR     ? HX APPENDECTOMY     ? PR LAPAROSCOPY SURG RPR INITIAL INGUINAL HERNIA      right     Family History   Problem Relation Age of Onset   ? Melanoma Father    ? Cancer Father         oral mandibular   ? High Cholesterol Mother    ? Cancer-Colon Maternal Aunt 70   ? Diabetes Neg Hx    ? Hypertension Neg Hx    ? Cancer-Breast Neg Hx    ? Cancer-Ovarian Neg Hx    ? Cancer-Prostate Neg Hx      Social History     Socioeconomic History   ? Marital status: Divorced     Spouse name: Not on file   ? Number of children: Not on file   ? Years of education: Not on file   ? Highest education level: Not on file   Occupational  History   ? Occupation: unemployed   Tobacco Use   ? Smoking status: Current Every Day Smoker     Packs/day: 1.00     Years: 25.00     Pack years: 25.00     Types: Cigarettes     Last attempt to quit: 01/2017     Years since quitting: 3.2   ? Smokeless tobacco: Never Used   ? Tobacco comment: vapeing   Vaping Use   ? Vaping Use: Former   Substance and Sexual Activity   ? 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 Drug use: Yes     Types: Methamphetamines, IV     Comment: IV crystal meth. Last use was within the past 24 hours.   ? Sexual activity: Yes     Partners: Male   Other Topics Concern   ? Not on file   Social History Narrative    Pagan       Allergies   Allergen Reactions   ? Sulfa (Sulfonamide Antibiotics) SEE COMMENTS     Patient reports that his father and brother are allergic, he is unsure so he reports it as an allergy.             Review of Systems   Constitutional: Negative for chills, fatigue, fever and unexpected weight change.   HENT: Negative for congestion, ear discharge, ear pain, hearing loss, nosebleeds, rhinorrhea, sinus pressure, sinus pain, sore throat and trouble swallowing.    Eyes: Negative for pain, discharge, redness and visual disturbance.   Respiratory: Negative for cough, chest tightness, shortness of breath and wheezing.    Cardiovascular: Negative for chest pain, palpitations and leg swelling.   Gastrointestinal: Negative for abdominal pain, blood in stool, constipation, diarrhea, nausea and vomiting.   Endocrine: Negative for polyuria.   Genitourinary: Negative for difficulty urinating, dysuria, frequency, hematuria, penile discharge, penile pain, penile swelling, scrotal swelling, testicular pain and urgency.   Musculoskeletal: Negative for arthralgias, back pain, myalgias, neck pain and neck stiffness.   Skin: Negative for color change, rash and wound.   Allergic/Immunologic: Negative for immunocompromised state.   Neurological: Negative for dizziness, tremors, syncope, speech difficulty, weakness, light-headedness, numbness and headaches.   Hematological: Negative for adenopathy. Does not bruise/bleed easily.   Psychiatric/Behavioral: Negative for dysphoric mood, self-injury, sleep disturbance and suicidal ideas. The patient is not nervous/anxious.    All other systems reviewed and are negative.        Objective:         ? ARIPiprazole (ABILIFY) 10 mg tablet TAKE 1 TABLET BY MOUTH DAILY   ? naproxen sodium (ALEVE PO) Take 2 tablets by mouth daily.   ? omeprazole DR (PRILOSEC) 40 mg capsule Take one capsule by mouth daily before breakfast.   ? TRIUMEQ 600-50-300 mg tablet TAKE 1 TABLET BY MOUTH DAILY   ? valACYclovir (VALTREX) 1 gram tablet Take two tablets by mouth every 12 hours for 4 days. Begin at the first sign of cold sore.     Medications Discontinued During This Encounter   Medication Reason   ? saliva stimulant agents Comb.3 (BIOTENE MOISTURIZING) mouth spray        Vitals:    04/22/20 1320   BP: 108/64   BP Source: Arm, Right Upper   Patient Position: Sitting   Pulse: 97   Temp: 36.5 ?C (97.7 ?F)   TempSrc: Oral   SpO2:  97%   Weight: 65.8 kg (145 lb)   Height: 163 cm (5' 4.17)   PainSc: Five     Body mass index is 24.76 kg/m?Marland Kitchen     Physical Exam  Vitals and nursing note reviewed. Constitutional:       General: He is awake.      Appearance: Normal appearance. He is well-groomed.   HENT:      Head: Normocephalic and atraumatic.      Right Ear: Tympanic membrane, ear canal and external ear normal.      Left Ear: Tympanic membrane, ear canal and external ear normal.      Nose: Nose normal. No congestion or rhinorrhea.      Mouth/Throat:      Mouth: Mucous membranes are moist.      Pharynx: Oropharynx is clear. No oropharyngeal exudate or posterior oropharyngeal erythema.   Eyes:      General: Lids are normal. Lids are everted, no foreign bodies appreciated. No visual field deficit.        Right eye: No discharge.         Left eye: No discharge.      Extraocular Movements: Extraocular movements intact.      Conjunctiva/sclera: Conjunctivae normal.      Right eye: Right conjunctiva is not injected. No exudate or hemorrhage.     Left eye: Left conjunctiva is not injected. No exudate or hemorrhage.     Pupils: Pupils are equal, round, and reactive to light.   Neck:      Thyroid: No thyroid mass, thyromegaly or thyroid tenderness.      Vascular: No carotid bruit or JVD.   Cardiovascular:      Rate and Rhythm: Normal rate and regular rhythm.      Heart sounds: Normal heart sounds, S1 normal and S2 normal. Heart sounds not distant. No murmur heard.  Pulmonary:      Effort: Pulmonary effort is normal. No respiratory distress.      Breath sounds: Normal breath sounds. No stridor. No wheezing, rhonchi or rales.   Chest:      Chest wall: No deformity.   Breasts:      Right: No supraclavicular adenopathy.      Left: No supraclavicular adenopathy.       Abdominal:      General: Abdomen is flat. Bowel sounds are normal. There is no distension.      Palpations: Abdomen is soft. There is no mass.      Tenderness: There is no abdominal tenderness. There is no guarding.      Hernia: No hernia is present.   Genitourinary:     Comments: Declines genital exam.   Musculoskeletal:         General: No deformity or signs of injury. Normal range of motion.      Cervical back: Normal range of motion and neck supple. No muscular tenderness.      Right lower leg: No edema.      Left lower leg: No edema.   Lymphadenopathy:      Cervical: No cervical adenopathy.      Upper Body:      Right upper body: No supraclavicular adenopathy.      Left upper body: No supraclavicular adenopathy.   Skin:     General: Skin is warm and dry.      Capillary Refill: Capillary refill takes less than 2 seconds.      Findings: No erythema, lesion or rash.  Neurological:      General: No focal deficit present.      Mental Status: He is alert and oriented to person, place, and time.      Cranial Nerves: No cranial nerve deficit.      Sensory: No sensory deficit.      Motor: No weakness.      Coordination: Coordination normal.      Gait: Gait normal.      Deep Tendon Reflexes: Reflexes normal.   Psychiatric:         Mood and Affect: Mood normal.         Behavior: Behavior normal. Behavior is cooperative.         Thought Content: Thought content normal.         Judgment: Judgment normal.         Depression:  Patient Scores:  PHQ-2: No data recorded  PHQ-9: No data recorded  Interventions:  PHQ-2: No data recorded  Depression Interventions PHQ-2/9: No data recorded    BMI:  Body mass index is 24.76 kg/m?Marland Kitchen  No data recorded    Falls:  Fall History (last 70mo): No Falls (04/22/2020  1:23 PM)  Fall Risk: None identified (04/22/2020  1:22 PM)      No results found for this or any previous visit (from the past 336 hour(s)).    Health Maintenance Summary    -      COLORECTAL CANCER SCREENING (Every 5 Years) Next due on 06/16/2020    06/17/2015  COLONOSCOPY     06/17/2015  Surgical Procedure: COLONOSCOPY           INFLUENZA VACCINE (Yearly - August to March) Next due on 08/18/2020    02/07/2019  Imm Admin: Flu Vaccine =>65 YO High-Dose Quadrivalent (PF)     11/24/2016  Imm Admin: Flu Vaccine =>65 YO High-Dose (PF)     02/11/2016  Imm Admin: Flu Vaccine =>65 YO High-Dose (PF)     10/04/2013  Done     10/04/2013  Imm Admin: Flu Vaccine Trivalent =>3 Yo (Preservative Free)           HEPATITIS C SCREENING  Completed    03/21/2019  Anti HCV component of HEPATITIS C ANTIBODY W REFLEX HCV PCR   QUANT     07/29/2016  Anti HCV component of HEPATITIS C AB     07/31/2013  Anti HCV component of HEPATITIS C AB                   Assessment and Plan:           [name] was seen today for establish care, physical, labs only, imm/inj, other and lump.    Diagnoses and all orders for this visit:    Routine general medical examination at a health care facility  -     CBC AND DIFF; Future; Expected date: 04/22/2020  -     URINALYSIS DIPSTICK REFLEX TO CULTURE; Future; Expected date: 04/22/2020  -     URINALYSIS MICROSCOPIC REFLEX TO CULTURE; Future; Expected date: 04/22/2020  -     UA REFLEX LABEL; Future; Expected date: 04/22/2020  RTC for fasting labs.   Encouraged the continued participation in preventative medicine for the early detection of disease and improved health.   Provided education (see patient AVS) regarding:    1. Annual eye exams.    2. Biannual dental exams and cleanings    3. ~120 minutes per week of exercise.  4. Healthy diet high in fiber and protein, limiting simple carbohydrates, and watching portion control.     5. Annual skin checks by a provider and routine self exams. Reviewed the concepts of mole monitoring.     6. Avoidance of tobacco products include cigarettes and vaping methods.     7. Limiting alcohol to 7 drinks per week in females and 14 drinks per week for males.   Immunizations recommended today: Shingrix (patient will check insurance)  Health Maintenance items reviewed. Patient understands he is due for: Fasting labs.    Methamphetamine use disorder, mild (HCC)  Other stimulant dependence, uncomplicated (HCC)  Actively using. Sees [facility] and is aware the high risk of serious injury and death associated with substance abuse.    Sepsis without acute organ dysfunction, due to unspecified organism [facility])  He has had several hospitalizations related to cellulitis and sepsis secondary to IV drug use.    Bipolar 2 disorder (HCC)  Chronic. Follows closely with [facility].     HIV infection, unspecified symptom status (HCC)  Chronic. Follows with [facility].     Renal cell carcinoma of left kidney (HCC)  Renal cell cancer, left (HCC)  Chronic. Diagnosed in 2017, partial nephrectomy was curative.   Continues to see  nephrology twice annually.    Other primary thrombophilia (HCC)  Noted in labs. We will continue to trend CBCs.    Submental mass  -     [place] HEAD AND NECK; Future; Expected date: 04/22/2020  Suspect lymphadenopathy secondary to recent fever blister.   Check [place] to confirm or r/o.    Mixed hyperlipidemia  -     LIPID PROFILE; Future; Expected date: 04/22/2020  -     COMPREHENSIVE METABOLIC PANEL; Future; Expected date: 04/22/2020  -     CT CARDIAC CALCIUM SCORE WO CONT; Future; Expected date: 04/22/2020  Chronic. Never been treated with medication.  RTC for fasting labs.   Also obtain CT Ca Score given risk factors.     Food insecurity  Patient reports a few concerns with SDOH screening today.   Concerns were reviewed, patient declined social work referral or additional help today.    Tobacco abuse  Chronic. Patient is aware of the health risks associated with smoking. Does not wish to quit at this time.     Oral herpes simplex infection   -     valACYclovir (VALTREX) 1 gram tablet; Take two tablets by mouth every 12 hours for 4 days. Begin at the first sign of cold sore.   Chronic. Gets several outbreaks a year and treats with OTCs.   Start Valtrex with episodic treatment.    Low hemoglobin  -     IRON + BINDING CAPACITY + %SAT+ FERRITIN; Future; Expected date: 04/22/2020  -     VITAMIN B12; Future; Expected date: 04/22/2020  -     FOLATE, SERUM; Future; Expected date: 04/22/2020  Noted with past labs. Check additional studies.   Patient denies history of anemia or IDA.    Screening for malignant neoplasm of prostate  -     PSA SCREEN; Future; Expected date: 04/22/2020  Declined DRE. PSA with labs.         I reviewed with the patient their current medications and specifically any new medications prescribed at the time of this visit and we reviewed the expected benefits and potential side effects. All questions are answered to the patient's satisfaction.    Patient education provided regarding diagnosis,  course, and treatment. Patient was in agreement to instructions, POC, and will call if questions or concerns arise.     Total Time Today was 40 minutes in the following activities: Preparing to see the patient, Obtaining and/or reviewing separately obtained history, Performing a medically appropriate examination and/or evaluation, Counseling and educating the patient/family/caregiver, Ordering medications, tests, or procedures, Referring and communication with other health care professionals (when not separately reported), Documenting clinical information in the electronic or other health record and Independently interpreting results (not separately reported) and communicating results to the patient/family/caregiver      Return in about 6 months (around 10/22/2020) for chronic coniditions.      No future appointments.

## 2020-04-22 ENCOUNTER — Encounter: Admit: 2020-04-22 | Discharge: 2020-04-22 | Payer: No Typology Code available for payment source

## 2020-04-22 ENCOUNTER — Ambulatory Visit: Admit: 2020-04-22 | Discharge: 2020-04-22 | Payer: No Typology Code available for payment source

## 2020-04-22 DIAGNOSIS — F1510 Other stimulant abuse, uncomplicated: Secondary | ICD-10-CM

## 2020-04-22 DIAGNOSIS — L409 Psoriasis, unspecified: Secondary | ICD-10-CM

## 2020-04-22 DIAGNOSIS — B20 Human immunodeficiency virus [HIV] disease: Secondary | ICD-10-CM

## 2020-04-22 DIAGNOSIS — R221 Localized swelling, mass and lump, neck: Secondary | ICD-10-CM

## 2020-04-22 DIAGNOSIS — Z5941 Food insecurity: Secondary | ICD-10-CM

## 2020-04-22 DIAGNOSIS — Z0000 Encounter for general adult medical examination without abnormal findings: Secondary | ICD-10-CM

## 2020-04-22 DIAGNOSIS — C642 Malignant neoplasm of left kidney, except renal pelvis: Secondary | ICD-10-CM

## 2020-04-22 DIAGNOSIS — E785 Hyperlipidemia, unspecified: Secondary | ICD-10-CM

## 2020-04-22 DIAGNOSIS — T1491XA Suicide attempt, initial encounter: Secondary | ICD-10-CM

## 2020-04-22 DIAGNOSIS — B002 Herpesviral gingivostomatitis and pharyngotonsillitis: Secondary | ICD-10-CM

## 2020-04-22 DIAGNOSIS — Z125 Encounter for screening for malignant neoplasm of prostate: Secondary | ICD-10-CM

## 2020-04-22 DIAGNOSIS — E782 Mixed hyperlipidemia: Secondary | ICD-10-CM

## 2020-04-22 DIAGNOSIS — F3181 Bipolar II disorder: Secondary | ICD-10-CM

## 2020-04-22 DIAGNOSIS — K5792 Diverticulitis of intestine, part unspecified, without perforation or abscess without bleeding: Secondary | ICD-10-CM

## 2020-04-22 DIAGNOSIS — S129XXA Fracture of neck, unspecified, initial encounter: Secondary | ICD-10-CM

## 2020-04-22 DIAGNOSIS — A419 Sepsis, unspecified organism: Secondary | ICD-10-CM

## 2020-04-22 DIAGNOSIS — D6859 Other primary thrombophilia: Secondary | ICD-10-CM

## 2020-04-22 DIAGNOSIS — Z720 Tobacco use: Secondary | ICD-10-CM

## 2020-04-22 DIAGNOSIS — D649 Anemia, unspecified: Secondary | ICD-10-CM

## 2020-04-22 DIAGNOSIS — F1520 Other stimulant dependence, uncomplicated: Secondary | ICD-10-CM

## 2020-04-22 DIAGNOSIS — K219 Gastro-esophageal reflux disease without esophagitis: Secondary | ICD-10-CM

## 2020-04-22 MED ORDER — VALACYCLOVIR 1 GRAM PO TAB
2000 mg | ORAL_TABLET | Freq: Two times a day (BID) | ORAL | 3 refills | Status: AC
Start: 2020-04-22 — End: ?

## 2020-04-22 NOTE — Patient Instructions
It was a pleasure to see you today and take part in your care.     Notes from today's visit:  1. Please see attached documents for tips on mole monitoring, preventative care screenings, and additional information about living your healthiest life!  2. Vaccines: Shingles vaccine was recommended today. Please contact your insurance to see if they cover the shingles vaccine (Shingrix). If they do, you can walk in during our office walk in hours (M-F 8a - 11:30a & 1 p- 4:30 p) to start this vaccine series.   3. Medication changes:   a. 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  4. Imaging: You can call to schedule your ultrasound of your chin at [phone].  5. Please plan to return for a LAB-ONLY visit to complete your ordered blood work from today's visit. PLEASE COME FASTING for 8 HOURS (nothing to eat or drink aside from water or black coffee). NO APPOINTMENT NEEDED. OUR WALK IN LAB HOURS: MON - FRI 8am-11:30am, 1pm-4:30pm.  6. Labs: Your labs will be available for you to view on MyChart as early as 8 hours from the time they are drawn. Please allow me 2-3 business days to review all results and post a results message to you. I typically wait until all results are back before reviewing. If anything is critical, I will be promptly paged and you will be notified. I appreciate your patience and understanding!    Patient satisfaction is essential to our success. You may be contacted by the Medical Practice Survey in the coming days for a survey and your feedback is very important to [place]. Feedback helps [place] identify our strengths and weaknesses in relationship to the goals we have set for ourselves. Responses of VERY GOOD (5) are viewed as positive. If you do not feel we reached this level of service please share your concerns with myself and/or our Engineer, manufacturing.      *If you had to wait on me today, please accept my sincerest apologies. I know your time is very valuable and I want to honor it. It always my best intention to run on time, however on occasion, I do run behind due to unexpected issues that arise with patients.     Have a delightful day,   Dylan Monforte, APRN    Monitoring Moles  Moles are small, colored (pigmented) marks on the skin. They have no known purpose. Many moles appear before age 62. But they also often increase as people age. Moles most often aren't cancer (benign) and are harmless. But some moles become cancer (malignant). That?s why you need to watch the moles on your body. And tell your healthcare provider about any that concern you.  What are moles?  Moles are a type of pigmented mark. Freckles are another type of pigmented mark. They're often sprinkled across the bridge of the nose, the cheeks, and the arms. Moles can appear on any part of the body. There are many types, sizes, and shapes of moles. Most moles are solid brown. In most cases they are flat or dome-shaped, smooth, and have well-defined edges.  Why worry about moles?  Most moles are benign and don?t need treatment. You can have moles removed if you don?t like the way they look or feel. But moles may become a problem if they appear after you are 30. Or if they change in certain ways. These moles may turn into melanoma, a type of skin cancer. Melanoma rates have increased quickly over recent decades. It's  often curable if caught early. But this disease can be life-threatening, particularly when not diagnosed early. Your risk for melanoma is higher if you:  ? Have a lot of moles  ? Have had?more lifetime exposure to the sun or skin that burns easily  ? Have had severe blistering?sunburns  ? Use tanning beds and sunlamps  ? Have a personal or family history of skin cancer  To manage your risk, it?s smart to check your moles for changes. Also ask your provider to do a complete skin exam when you have a physical exam. To do this, you first need to learn where your moles are. Then check your moles each month.  Checking your moles  You can check many of your moles each month. You can do this right after you shower and before you get dressed. Check your body from head to toe. Be sure to check hard-to-see places. These include your back, neck, buttocks, between your toes, and the bottom of your feet. You can check these areas by using mirrors or by asking someone you trust to help. Then make a list of your moles. If you find any new moles or changes in your moles, call your provider. To check your moles, you may need:  ? A full-length mirror and a hand mirror  ? A stool or chair to sit on while you check your feet  If you have a lot of moles, take digital photos of them. Make sure to take photos both up close and from a distance. These can help you see if any moles change over time.     Don't forget to check your feet.     When to get medical care  See your healthcare provider?if your moles hurt, itch, ooze, bleed, thicken, become crusty, or show other changes. Also call your healthcare provider?if your moles show any of these signs of melanoma:  ? A change in size, shape, color, or height  ? The sides don?t match (asymmetry)  ? Ragged, notched, or blurred borders  ? Different colors in the same mole  ? Size is larger than?5 mm or?6 mm in diameter (the size of a pencil eraser)  [employer] last reviewed this educational content on 10/19/2019  ? 2000-2021 [employer], [place]. All rights reserved. This information is not intended as a substitute for professional medical care. Always follow your healthcare professional's instructions.            Prevention Guidelines, Men Ages 35 to 35  Screening tests and vaccines are an important part of managing your health. A screening test is done to find diseases in people who don't have any symptoms. The goal is to find a disease early so lifestyle changes and checkups can reduce the risk of disease. Or the goal may be to detect it early to treat it most effectively. Screening tests are not used to diagnose a disease. But they are used to see if more testing is needed. Health counseling is important, too. Below are guidelines for these, for men ages 15 to 1. Keep in mind that screening recommendations vary among expert groups. Talk with your healthcare provider about which tests are best for you and to make sure you?re up-to-date on what you need.   Screening  Who needs it  How often    Unhealthy alcohol use  All men in this age group  At routine exams   Blood pressure All men in this age group  Yearly checkup if your  blood pressure is normal   Normal blood pressure is 120/80 mm Hg   If your blood pressure reading is higher than normal, follow the advice of your healthcare provider    Colorectal cancer All men at average risk in this age group  Multiple tests are available and are used at different times. Possible tests include:   ? Colonoscopy every 10 years, or  ? Flexible sigmoidoscopy every 5 years (or every 10 years with yearly FIT stool test), or  ? CT colonography (virtual colonoscopy) every 5 years, or  ? Yearly fecal occult blood test, or  ? Yearly stool fecal immunochemical test (FIT) , or  ? Stool DNA test, every 1 to 3 years  If you choose a test other than a colonoscopy and have an abnormal test result, you will need to follow-up with a colonoscopy. Talk with your healthcare provider about which tests are best for you.   Some people should be screened using a different schedule because of their personal or family health history. Talk with your healthcare provider about your health history.    Depression All men in this age group  At routine exams   Type 2 diabetes or prediabetes  All men beginning at age 82 and men without symptoms at any age who are overweight or obese and have 1 or more other risk factors for diabetes  At least every 3 years (yearly if your blood sugar has already begun to rise)    Type 2 diabetes All men with prediabetes  Every year   Hepatitis C Men at increased risk for infection; 1 time for those born between 1945 and 1965  At routine exams; talk with your healthcare provider.    High cholesterol or triglycerides  All men in this age group  At least every 4 to 6 years; talk with your healthcare provider about your risk and whether it should be tested more often    HIV All males up to age 82 and men at increased risk.  At least once up to age 84 at routine exams; talk with your healthcare provider if you are at risk    Lung cancer Men between ages 30 and 30 who are in fairly good health and are at higher risk for lung cancer:   ? Currently smoke or who have quit within past 15 years  ? 20-pack year smoking history, or 1 pack/day for 20 years or 2 packs/day for 10 years  Expert groups vary a bit in their recommendations so talk with your provider  Yearly lung cancer screening with a low-dose CT scan (LDCT); talk with your healthcare provider    Obesity All men in this age group  At yearly routine exams    BMI (body mass index) All men in this age group Every year, to help find out if you are at a healthy weight for your height    Prostate cancer Starting at age 44, talk with your healthcare provider about risks and benefits of testing with digital rectal exam (DRE) and prostate-specific antigen (PSA) screening  At routine exams if you decide to be tested.    Syphilis Men at increased risk for infection  At routine exams; talk with your healthcare provider    Tuberculosis Men at increased risk for infection  Talk with your healthcare provider    Vision All men in this age group  Talk with your healthcare provider   Vaccine  Who needs it  How often  Chickenpox (varicella)  All men in this age group who have no record of this infection or vaccine  2 doses; second dose should be given at least 4 weeks after the first dose    Hepatitis A Men at increased risk for infection  2 or 3 doses (depending on the vaccine) given at least 6 months apart; talk with your healthcare provider    Hepatitis B Men at increased risk for infection  2 or 3 doses (depending on the vaccine) second dose should be given 1 month after the first dose; if a third dose , it should be given at least 2 months after the second dose and at least 4 months after the first dose; talk with your healthcare provider    Haemophilus influenzae Type B (HIB)  Men at increased risk for infection  1 or 3 doses; talk with your healthcare provider    Influenza (flu) All men in this age group  Once a year   COVID-19 All men in this age group  1 to 2 doses depending on vaccine; talk with your healthcare provider    Measles, mumps, rubella (MMR)  Men in this age group born in [birth year] or later who have no record of these infections or vaccines  1 or 2 doses; talk with your healthcare provider    Meningococcal ACWY (MenACWY)  Men at increased risk for infection  1 or 2 doses depending on your case. Then a booster every 5 years if you are still at risk. Talk with your healthcare provider.    Meningococcal B (MenB) Men at increased risk for infection 2 or 3 doses, depending on the vaccine and your case; talk with your healthcare provider    Pneumococcal conjugate vaccine (PCV13) and pneumococcal polysaccharide vaccine (PPSV23)  Men at increased risk for infection  PCV13: 1 dose ages 34 to 34 (protects against 13 types of pneumococcal bacteria)   PPSV23: 1 to 2 doses through age 56, (protects against 23 types of pneumococcal bacteria)    Tetanus/diphtheria/pertussis (Td/Tdap) booster All men in this age group  1 dose if Tdap, then Td or Tdap booster every 10 years    Recombinant zoster vaccine (RZV) (Shingrix)  All men in this age group  2 doses; the 2nd dose is given 2 to 6 months after the first. This is given even if you've had shingles before, not sure if you have had chickenpox, or had a previous zoster live vaccine    Counseling  Who needs it  How often    Diet and exercise Men who are overweight or obese  When diagnosed, and then at routine exams    Sexually transmitted infection prevention  Men at increased risk for infection  At routine exams; talk with your healthcare provider    Use of daily aspirin  Men who have a history of heart attack or stroke may be prescribed daily aspirin. Experts vary on their recommendations on whether it should be given routinely  At routine exams; talk with your healthcare provider about the risks vs. benefits for you    Use of statin medicines for cholesterol Men between the ages of 27 and 11 years who have   ? An LDL-C level of more than 70 mg/dL but less than 938 mg/dL, no diabetes and borderline to high level of risk  ? An LDL-C level of 190 mg/dL or greater  ? A diagnosis of diabetes and LDL-C level of greater than 70mg /dL At routine exams, or more  often as directed by your healthcare provider. Statin dosages may vary based on your overall health, risk factors, and other health conditions such as diabetes. Talk with your healthcare provider about your risk .    Use of tobacco and the health effects it can cause  All men in this age group  Every exam   StayWell last reviewed this educational content on 05/19/2019  ? 2000-2021 [employer], [place]. All rights reserved. This information is not intended as a substitute for professional medical care. Always follow your healthcare professional's instructions.

## 2020-04-29 ENCOUNTER — Encounter: Admit: 2020-04-29 | Discharge: 2020-04-29 | Payer: No Typology Code available for payment source

## 2020-04-29 ENCOUNTER — Emergency Department: Admit: 2020-04-29 | Discharge: 2020-04-29 | Payer: No Typology Code available for payment source

## 2020-04-29 DIAGNOSIS — T1491XA Suicide attempt, initial encounter: Secondary | ICD-10-CM

## 2020-04-29 DIAGNOSIS — W19XXXA Unspecified fall, initial encounter: Secondary | ICD-10-CM

## 2020-04-29 DIAGNOSIS — L409 Psoriasis, unspecified: Secondary | ICD-10-CM

## 2020-04-29 DIAGNOSIS — K219 Gastro-esophageal reflux disease without esophagitis: Secondary | ICD-10-CM

## 2020-04-29 DIAGNOSIS — B20 Human immunodeficiency virus [HIV] disease: Secondary | ICD-10-CM

## 2020-04-29 DIAGNOSIS — M79601 Pain in right arm: Secondary | ICD-10-CM

## 2020-04-29 DIAGNOSIS — S129XXA Fracture of neck, unspecified, initial encounter: Secondary | ICD-10-CM

## 2020-04-29 DIAGNOSIS — K5792 Diverticulitis of intestine, part unspecified, without perforation or abscess without bleeding: Secondary | ICD-10-CM

## 2020-04-29 DIAGNOSIS — E785 Hyperlipidemia, unspecified: Secondary | ICD-10-CM

## 2020-04-29 MED ORDER — FENTANYL CITRATE (PF) 50 MCG/ML IJ SOLN
50 ug | Freq: Once | INTRAVENOUS | 0 refills | Status: DC
Start: 2020-04-29 — End: 2020-04-29

## 2020-04-29 MED ORDER — OXYCODONE 5 MG PO TAB
5 mg | Freq: Once | ORAL | 0 refills | Status: CP
Start: 2020-04-29 — End: ?
  Administered 2020-04-29: 19:00:00 5 mg via ORAL

## 2020-04-29 NOTE — ED Notes
Pt is a 50 year old male who presents to [facility] room 36 due to a fall. Pt states he fell down 3 stairs and landed on a concrete floor at home earlier today. Per patient he landed on his right arm and also hit the left side of his head. Pt denies losing consciousness or feeling dizzy prior to falling. Pt states he felt dizzy, sweaty, and nauseous after falling. Pt endorses dizziness when standing and nausea at this time. Pt's right hand and forearm are swollen. Pt denies taking blood thinners.     Pt is A&Ox4. VSS. Pt placed on cardiac, BP, and SpO2 monitor. Bed in lowest and locked position. Side rails up x2. Call light and belongings within reach.     Belongings: shirt, pants, socks, shoes, glasses.

## 2020-04-30 ENCOUNTER — Encounter: Admit: 2020-04-30 | Discharge: 2020-04-30 | Payer: No Typology Code available for payment source

## 2020-04-30 DIAGNOSIS — B20 Human immunodeficiency virus [HIV] disease: Secondary | ICD-10-CM

## 2020-04-30 MED ORDER — TRIUMEQ 600-50-300 MG PO TAB
ORAL_TABLET | Freq: Every day | 3 refills | Status: AC
Start: 2020-04-30 — End: ?

## 2020-04-30 NOTE — Telephone Encounter
ED Discharge Follow Up  Reached patient: No     Admission [facility] Name : [facility]  ED Admission Date: 04/29/20 ED Discharge Date: 04/29/20   Admission Diagnosis: Fall  Discharge Diagnosis Fall; pain of [place]: Unplanned  Today's call is 1 (calendar) days post discharge    Medication Reconciliation  Changes to pre-ED visit medications? No       ARIPiprazole (ABILIFY) 10 mg tablet TAKE 1 TABLET BY MOUTH DAILY    naproxen sodium (ALEVE PO) Take 2 tablets by mouth daily.    omeprazole DR (PRILOSEC) 40 mg capsule Take one capsule by mouth daily before breakfast.    TRIUMEQ 600-50-300 mg tablet TAKE 1 TABLET BY MOUTH DAILY       Scheduling Follow-up Appointment  Upcoming appointment date and time and with whom scheduled:   Future Appointments   Date Time Provider [place]   05/06/2020  9:15 AM [facility]   05/21/2020 12:15 PM [facility]     When was patients last PCP visit: Visit date not found  PCP primary location: [place]  PCP appointment scheduled? No, left message with phone # to call for appt.   Specialist appointment scheduled? No  Both PCP and Specialist appointment scheduled: No  MyChart message sent? Active in MyChart. MyChart message sent.    ED Communication   Did Pt call Clinic prior to going to ED? No, no call documented.    Linna Hoff, RN

## 2020-04-30 NOTE — Telephone Encounter
Last Lab: 08/17/19  Last Viral Load: <40  Last STI testing: 03/21/19  Last OV: 02/07/19  Next OV: Not scheduled

## 2020-05-06 ENCOUNTER — Encounter: Admit: 2020-05-06 | Discharge: 2020-05-06 | Payer: No Typology Code available for payment source

## 2020-05-13 ENCOUNTER — Encounter: Admit: 2020-05-13 | Discharge: 2020-05-13 | Payer: No Typology Code available for payment source

## 2020-05-13 ENCOUNTER — Ambulatory Visit: Admit: 2020-05-13 | Discharge: 2020-05-13 | Payer: No Typology Code available for payment source

## 2020-05-13 DIAGNOSIS — R221 Localized swelling, mass and lump, neck: Secondary | ICD-10-CM

## 2020-05-14 NOTE — Progress Notes
Charles Walls is a 50 y.o. male.    Chief Complaint   Patient presents with   ? Emergency Room Follow up     Patient had a fall and was seen at the ED on 04/29/2020.  Had pain in the RT lower arm.  X-rays negative    ? Imm/Inj     Shingrix   ? Leg Pain     Lower leg - red area - from trying to shoot up.   ? Mouth Pain     Onset 6-8 weeks           Subjective:            HPI    Charles Walls presents to discuss the following:      Fall: Presented to [facility] on 4/12 after falling and slipping down 3 stairs. Reports right knee and right arm pain. X-rays performed of the elbow, forearm, wrist, and knee without acute findings. Given oxycodone for pain and discharged. Reports his right forearm is a bit tender but he has recovered well.     Oral pain: Broken lower tooth in December 2021. States it is sharp and has caused some sores. Had a cold sore for a long time that has now healed. States his mouth feels like it has bees in it. No sores or lesions. Last time he saw a dentist was early 2020. Pending appointment on May 24th.    Lower leg infection: Reports he missed a vein in his left lower leg when attempting to inject himself with meth. This was approximately 4-5 days ago. The area has since become red, painful, and swollen.     [place] results: Completed US on 4/27 showing a large thyroid mass and possible enlarged submental lymph node vs. Mass. Biopsies recommended for both findings.        Review of Systems   Constitutional: Negative for chills, fatigue, fever and unexpected weight change.   HENT: Positive for mouth sores (No current sores, but has had them in the past. Endorses mouth pain.). Negative for drooling.    Eyes: Negative for visual disturbance.   Respiratory: Negative for cough, chest tightness, shortness of breath and wheezing.    Cardiovascular: Negative for chest pain, palpitations and leg swelling.   Musculoskeletal: Positive for arthralgias (left forearm healing form fall on 4/12). Negative for myalgias, neck pain and neck stiffness.   Skin: Positive for wound (left lower leg).   Neurological: Negative for dizziness, syncope, light-headedness, numbness and headaches.   Psychiatric/Behavioral: Negative for dysphoric mood and suicidal ideas. The patient is not nervous/anxious.    All other systems reviewed and are negative.          Your Current Medications:       Instructions    ARIPiprazole (ABILIFY) 10 mg tablet TAKE 1 TABLET BY MOUTH DAILY    naproxen sodium (ALEVE PO) Take 2 tablets by mouth daily.    omeprazole DR (PRILOSEC) 40 mg capsule Take one capsule by mouth daily before breakfast.    TRIUMEQ 600-50-300 mg tablet TAKE 1 TABLET BY MOUTH DAILY          There are no discontinued medications.      Objective:          Vitals:    05/16/20 1507   BP: 118/68   BP Source: Arm, Left Upper   Pulse: 82   Temp: 36.8 ?C (98.3 ?F)   SpO2: 97%   TempSrc: Oral   PainSc:  Three   Weight: 66.7 kg (147 lb)   Height: 163 cm (5' 4.17)     Body mass index is 25.1 kg/m?Marland Kitchen   No LMP for male patient.      Physical Exam  Vitals and nursing note reviewed.   Constitutional:       Appearance: Normal appearance.   HENT:      Head: Normocephalic and atraumatic.      Right Ear: External ear normal.      Left Ear: External ear normal.      Nose: Nose normal. No congestion or rhinorrhea.      Mouth/Throat:      Lips: Pink. No lesions.      Mouth: Mucous membranes are moist. No injury or oral lesions.      Dentition: Abnormal dentition (broken tooth). No dental tenderness, gingival swelling, dental abscesses or gum lesions.      Tongue: No lesions.      Pharynx: Oropharynx is clear. Uvula midline.   Eyes:      Extraocular Movements: Extraocular movements intact.   Cardiovascular:      Rate and Rhythm: Normal rate and regular rhythm.      Heart sounds: Normal heart sounds.   Pulmonary:      Effort: Pulmonary effort is normal.      Breath sounds: Normal breath sounds.   Musculoskeletal:         General: Normal range of motion. Cervical back: Normal range of motion and neck supple.   Skin:     General: Skin is warm and dry.      Findings: No rash (on visible skin).          Neurological:      General: No focal deficit present.      Mental Status: He is alert and oriented to person, place, and time.   Psychiatric:         Mood and Affect: Mood normal.         Behavior: Behavior normal.         Thought Content: Thought content normal.         Judgment: Judgment normal.         No results found for this or any previous visit (from the past 336 hour(s)).    ULTRASOUND OF THE NECK     CLINICAL INDICATION: Male, 49 years; ?submental mass versus lymph node,   history of renal cell carcinoma     TECHNIQUE: Multiple grayscale and color Doppler ultrasound images were   obtained of the thyroid gland and anterior neck.     COMPARISON: CT cervical spine November 24, 2004     FINDINGS:     Isthmus: Normal in thickness. No discrete nodule.     Right lobe: Measures 4.2 x 2.0 x 1.8 cm. ?The right lobe parenchyma is   homogenous. No thyroid nodules are identified. ?     Left lobe: Measures 4.6 x 1.3 x 1.4 cm. ?2 left thyroid lobe nodule are   seen, as described below The left lobe parenchyma is otherwise homogenous.     Nodule 1: Inferior left thyroid lobe measuring 1.0 x 0.9 x 0.8 cm. The   nodule is solid, isoechoic to slightly hypoechoic, wider than tall with   smooth in ill-defined margins and punctate echogenic foci. TI-RADS 5,   highly suspicious.     Nodule 2: Inferior left thyroid lobe superior lateral to the   aforementioned nodule measuring 0.5 x 0.2 x 0.3 cm.  The nodule is solid,   hypoechoic, wider than tall, with ill-defined margins and no echogenic   foci. TI-RADS 4, moderately suspicious. Given small size no additional   follow-up is needed. ?     Focused ultrasound images were obtained with specific focus on the   submental region at area of palpable abnormality. At the area of palpable   abnormality there is an oval hypoechoic lesion measuring 1.2 x 1.1 x 0.7   cm with hilar type internal blood flow (series 32F) most compatible with an   abnormal lymph node. Additional normal sized and prominent morphologically   normal cervical lymph nodes are seen. The submandibular glands are   heterogenous and hypoechoic.     IMPRESSION       1. ?Oval hypoechoic 1.2 cm lesion at the area of palpable abnormality in   the submental region with internal blood flow. This is most likely   morphologically abnormal lymph node. An extranodal mass is less likely..   Biopsy is recommended, if clinically. Contrast enhanced CT of the neck   could also be performed if clinically indicated.   2. ?Highly suspicious 9 mm left thyroid lobe nodule. Percutaneous biopsy   if clinically indicated   3. ?Hypoechoic and heterogenous submandibular glands, which may be   secondary to prior inflammation or infection.       Assessment and Plan:           Charles Walls was seen today for emergency room follow up, imm/inj, leg pain and mouth pain.    Diagnoses and all orders for this visit:    Cellulitis of right leg  -     doxycycline monohydrate (AVIDOXY) 100 mg tablet; Take one tablet by mouth twice daily for 10 days.  IV drug user  Sulfa allergy. Doxy for MRSA coverage.   Take through full course.  Reviewed importance of seeking prompt ED evaluation for worsening symptoms, fever, etc.    Left thyroid nodule  -     CONSULT INTERVENTIONAL RADIOLOGY PHYSICIAN; Standing  -     IR THYROID PERCUTANEOUS FINE NEEDLE ASPIRATION BIOPSY; Standing  Submental mass  -     CONSULT INTERVENTIONAL RADIOLOGY PHYSICIAN; Standing  -     IR LYMPH NODE PERCUTANEOUS CORE BIOPSY; Standing  Reviewed [place] results with patient today.   Proceed with biopsies of submental mass (lymph node?) and thyroid nodule.     Oral pain  -     lidocaine hcl viscous (LIDOCAINE VISCOUS) 2 % solution; Swish and Spit 5 mL by mouth as directed four times daily as needed.  No abnormalities noted in the mouth.   Dental visit pending for May.   Could be related to the findings on [place].   Vicous lidocaine PRN.   F/U for worsening or no improvement.        I reviewed with the patient their current medications and specifically any new medications prescribed at the time of this visit and we reviewed the expected benefits and potential side effects. All questions are answered to the patient's satisfaction.    Patient education provided regarding diagnosis, course, and treatment. Patient was in agreement to instructions, POC, and will call if questions or concerns arise.     Total Time Today was 35 minutes in the following activities: Preparing to see the patient, Obtaining and/or reviewing separately obtained history, Performing a medically appropriate examination and/or evaluation, Counseling and educating the patient/family/caregiver, Ordering medications, tests, or procedures, Documenting clinical information in the  electronic or other health record and Independently interpreting results (not separately reported) and communicating results to the patient/family/caregiver        Return if symptoms worsen or fail to improve.      Future Appointments   Date Time Provider Department Center   05/21/2020 12:15 PM CT - [facility]   10/22/2020  4:30 PM Rada Hay, APRN-NP [facility]

## 2020-05-15 ENCOUNTER — Encounter: Admit: 2020-05-15 | Discharge: 2020-05-15 | Payer: No Typology Code available for payment source

## 2020-05-16 ENCOUNTER — Ambulatory Visit: Admit: 2020-05-16 | Discharge: 2020-05-16 | Payer: No Typology Code available for payment source

## 2020-05-16 ENCOUNTER — Encounter: Admit: 2020-05-16 | Discharge: 2020-05-16 | Payer: No Typology Code available for payment source

## 2020-05-16 DIAGNOSIS — K1379 Other lesions of oral mucosa: Secondary | ICD-10-CM

## 2020-05-16 DIAGNOSIS — L409 Psoriasis, unspecified: Secondary | ICD-10-CM

## 2020-05-16 DIAGNOSIS — S129XXA Fracture of neck, unspecified, initial encounter: Secondary | ICD-10-CM

## 2020-05-16 DIAGNOSIS — K5792 Diverticulitis of intestine, part unspecified, without perforation or abscess without bleeding: Secondary | ICD-10-CM

## 2020-05-16 DIAGNOSIS — K219 Gastro-esophageal reflux disease without esophagitis: Secondary | ICD-10-CM

## 2020-05-16 DIAGNOSIS — L03115 Cellulitis of right lower limb: Secondary | ICD-10-CM

## 2020-05-16 DIAGNOSIS — B20 Human immunodeficiency virus [HIV] disease: Secondary | ICD-10-CM

## 2020-05-16 DIAGNOSIS — T1491XA Suicide attempt, initial encounter: Secondary | ICD-10-CM

## 2020-05-16 DIAGNOSIS — F1990 Other psychoactive substance use, unspecified, uncomplicated: Secondary | ICD-10-CM

## 2020-05-16 DIAGNOSIS — R221 Localized swelling, mass and lump, neck: Secondary | ICD-10-CM

## 2020-05-16 DIAGNOSIS — E785 Hyperlipidemia, unspecified: Secondary | ICD-10-CM

## 2020-05-16 DIAGNOSIS — E041 Nontoxic single thyroid nodule: Secondary | ICD-10-CM

## 2020-05-16 MED ORDER — LIDOCAINE HCL 2 % MM SOLN
5 mL | Freq: Four times a day (QID) | ORAL | 0 refills | 30.00000 days | Status: AC | PRN
Start: 2020-05-16 — End: ?

## 2020-05-16 MED ORDER — DOXYCYCLINE MONOHYDRATE 100 MG PO TAB
100 mg | ORAL_TABLET | Freq: Two times a day (BID) | ORAL | 0 refills | 8.00000 days | Status: AC
Start: 2020-05-16 — End: ?

## 2020-05-16 NOTE — Patient Instructions
It was good to see you again, Charles Walls.     Take the antibiotic as prescribed through the full course. Please go to the ER immediately for worsening symptoms or fever.     I have ordered biopsies for your thyroid and chin. You will be called to schedule or you can call: [phone]

## 2020-05-21 ENCOUNTER — Encounter: Admit: 2020-05-21 | Discharge: 2020-05-21 | Payer: No Typology Code available for payment source

## 2020-05-21 ENCOUNTER — Ambulatory Visit: Admit: 2020-05-21 | Discharge: 2020-05-21 | Payer: No Typology Code available for payment source

## 2020-05-21 DIAGNOSIS — E782 Mixed hyperlipidemia: Secondary | ICD-10-CM

## 2020-05-21 NOTE — Patient Education
Preliminary Report  CardioScore- Coronary Calcium Score      Name: Charles Walls  Date of birth: 1970/02/03  Age: 50 y.o.  Date: 05/21/2020   Gender: male    Referring Provider: Roma Kayser, APRN-NP      The following information is based on an analysis of the coronary (heart) arteries only. A coronary calcium score was computed based on the size and density of calcium deposits in each artery. Coronary calcium deposits do not correspond directly to the percentage of narrowing of the coronary arteries but are markers for underlying coronary atherosclerosis (narrowing of coronary arteries due to plaque). The higher your score, the higher your risk of future cardiac events. These calcium deposits may begin to form years before any symptoms develop. Early detection and risk factor modification such as smoking, and cholesterol intake, can slow the progress of heart disease.    Coronary Artery Calcium Score   (LM) Left Main 0   (LAD) Left Anterior Descending 163   (LCX) Left Circumflex 0   (RCA) Right Coronary Artery 35   (PDA) Posterior Descending Artery 0   Total 198     Your score indicates moderate calcification.     0  No identifiable calcification  1-10  Minimal identifiable calcification  11-100  Mild calcification  101-400 Moderate calcification  401+  Significant calcification      DEMOGRAPHIC COMPARISON    Your calcium score is 198, which places you in the 90th percentile for subjects of the same age and gender.     Your percentile rank using the MESA* reference value is 94%. The MESA reference value compares your calcium score to others with the same age (45-84), gender, and race/ethnicity.   *Multi-Ethnic Study of Atherosclerosis - www.mesa-nhlbi.org       ADDITIONAL COMMENTS    This scan is an evaluation of coronary artery calcification and is not intended for any other purpose.     This is a preliminary report. If additional information is noted on your final report, you will be contacted. RECOMMENDATIONS    _X_ Risk Factor Reduction  ? Diet: Eat a balanced meal that consists of a lean protein, vegetables, healthy fat and nutrient dense carbohydrates. Limit saturated and trans fat, red meat, highly processed foods, sweets, and sugar-sweetened beverages. Choose options that include:  - A variety of fruits and vegetables-choose 2 colors at each meal  - Lean proteins  - Low-fat dairy products  - Nuts and legumes  - Heart healthy fats (Extra virgin olive oil and avocado)    ? Exercise: Get 150 minutes of physical activity per week. Physical activity should increase your heart rate, strengthen major muscle groups and increase flexibility. Simple ways to add more activity to your day include walking the dog, taking a family walk, parking farther away, and taking the stairs.    ? Keep cholesterol, blood pressure, blood sugar and weight within normal limits.    ? Manage stress and sleep hygiene.    ? Avoid tobacco products and excess alcohol consumption.    _X_ Risk factor modification + follow up with your healthcare provider or Cardiologist. Further testing may be considered.     __ Risk factor modification + follow up with a Cardiologist. Further testing is recommended.    _X_ Repeat CardioScore *Consult with your healthcare provider before repeating  _X_ 3-5 years  __ 5 years    Should you ever experience chest pain, difficulty in breathing, discomfort radiating into your neck or  arm, or discomfort combined with lightheadedness, sweating, fainting or nausea, you should seek prompt medical attention.       Reviewed by: Daivd Council    [facility]  Department of Cardiovascular Medicine  [address]  [place], [place] [address]  Phone: [phone]  Email: [email or web address]

## 2020-05-28 ENCOUNTER — Encounter: Admit: 2020-05-28 | Discharge: 2020-05-28 | Payer: No Typology Code available for payment source

## 2020-06-25 ENCOUNTER — Encounter: Admit: 2020-06-25 | Discharge: 2020-06-25 | Payer: No Typology Code available for payment source

## 2020-06-25 MED ORDER — ARIPIPRAZOLE 10 MG PO TAB
ORAL_TABLET | Freq: Every day | 2 refills
Start: 2020-06-25 — End: ?

## 2020-07-02 ENCOUNTER — Encounter: Admit: 2020-07-02 | Discharge: 2020-07-02 | Payer: No Typology Code available for payment source | Primary: Family

## 2020-07-02 ENCOUNTER — Emergency Department: Admit: 2020-07-02 | Discharge: 2020-07-02 | Payer: No Typology Code available for payment source

## 2020-07-02 ENCOUNTER — Inpatient Hospital Stay: Admit: 2020-07-02 | Payer: No Typology Code available for payment source

## 2020-07-02 DIAGNOSIS — K5792 Diverticulitis of intestine, part unspecified, without perforation or abscess without bleeding: Secondary | ICD-10-CM

## 2020-07-02 DIAGNOSIS — F1510 Other stimulant abuse, uncomplicated: Secondary | ICD-10-CM

## 2020-07-02 DIAGNOSIS — B20 Human immunodeficiency virus [HIV] disease: Secondary | ICD-10-CM

## 2020-07-02 DIAGNOSIS — L409 Psoriasis, unspecified: Secondary | ICD-10-CM

## 2020-07-02 DIAGNOSIS — K219 Gastro-esophageal reflux disease without esophagitis: Secondary | ICD-10-CM

## 2020-07-02 DIAGNOSIS — L03116 Cellulitis of left lower limb: Principal | ICD-10-CM

## 2020-07-02 DIAGNOSIS — S129XXA Fracture of neck, unspecified, initial encounter: Secondary | ICD-10-CM

## 2020-07-02 DIAGNOSIS — T1491XA Suicide attempt, initial encounter: Secondary | ICD-10-CM

## 2020-07-02 DIAGNOSIS — E785 Hyperlipidemia, unspecified: Secondary | ICD-10-CM

## 2020-07-02 LAB — COMPREHENSIVE METABOLIC PANEL
ALBUMIN: 4.1 g/dL (ref 3.5–5.0)
ALK PHOSPHATASE: 79 U/L — ABNORMAL LOW (ref 25–110)
ALT: 11 U/L (ref 7–56)
ANION GAP: 11 K/UL (ref 3–12)
AST: 15 U/L (ref 7–40)
BLD UREA NITROGEN: 21 mg/dL (ref 7–25)
CHLORIDE: 101 MMOL/L (ref 98–110)
CO2: 25 MMOL/L (ref 21–30)
EGFR: >60 mL/min (ref >60)
GLUCOSE,PANEL: 80 mg/dL (ref 70–100)
POTASSIUM: 4.4 MMOL/L (ref 3.5–5.1)
SODIUM: 137 MMOL/L (ref 137–147)
TOTAL BILIRUBIN: 0.3 mg/dL (ref 0.3–1.2)
TOTAL PROTEIN: 7.9 g/dL — ABNORMAL HIGH (ref 6.0–8.0)

## 2020-07-02 LAB — POC LACTATE: LACTIC ACID POC: 0.9 MMOL/L (ref 0.5–2.0)

## 2020-07-02 LAB — CBC AND DIFF
ABSOLUTE BASO COUNT: 0 K/UL (ref 0–0.20)
ABSOLUTE EOS COUNT: 0.3 K/UL (ref 0–0.45)
ABSOLUTE MONO COUNT: 0.9 K/UL — ABNORMAL HIGH (ref 0–0.80)
MDW (MONOCYTE DISTRIBUTION WIDTH): 22 — ABNORMAL HIGH (ref <20.7)
WBC COUNT: 9.8 K/UL (ref 4.5–11.0)

## 2020-07-02 LAB — C REACTIVE PROTEIN (CRP): C-REACTIVE PROTEIN: 9.3 mg/dL — ABNORMAL HIGH (ref <1.0)

## 2020-07-02 LAB — SED RATE: ESR: 55 mm/h — ABNORMAL HIGH (ref 0–20)

## 2020-07-02 MED ORDER — VANCOMYCIN 1.5G IN 0.9% NACL IVPB (BATCHED)
20 mg/kg | Freq: Once | INTRAVENOUS | 0 refills | Status: AC
Start: 2020-07-02 — End: ?

## 2020-07-02 NOTE — ED Notes
50 y.o. male presents to [facility] ED 30 w CC of wound check. 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 Pt has a red warm abbess on his mid calf. Pt endorses fevers and chill at home. Wound is red and warm to touch. Pt denies CP, SOA, or additional complaints at this time. Pt AAOx4, airway patent, VSS, skin color appropriate for race, P/W/D.   ?   Pt on cardiac monitor, VSS. Bed in lowest locked position, side rails raised x2 call light within reach. Awaiting provider eval.

## 2020-07-03 ENCOUNTER — Encounter: Admit: 2020-07-03 | Discharge: 2020-07-03 | Payer: No Typology Code available for payment source | Primary: Family

## 2020-07-03 ENCOUNTER — Inpatient Hospital Stay: Admit: 2020-07-03 | Discharge: 2020-07-02 | Payer: No Typology Code available for payment source | Primary: Family

## 2020-07-03 ENCOUNTER — Inpatient Hospital Stay: Admit: 2020-07-03 | Discharge: 2020-07-03 | Payer: No Typology Code available for payment source | Primary: Family

## 2020-07-03 MED ORDER — ARIPIPRAZOLE 10 MG PO TAB
10 mg | Freq: Every day | ORAL | 0 refills | Status: AC
Start: 2020-07-03 — End: ?
  Administered 2020-07-03 – 2020-07-04 (×2): 10 mg via ORAL

## 2020-07-03 MED ORDER — PANTOPRAZOLE 40 MG PO TBEC
40 mg | Freq: Every day | ORAL | 0 refills | Status: AC
Start: 2020-07-03 — End: ?
  Administered 2020-07-03 – 2020-07-04 (×2): 40 mg via ORAL

## 2020-07-03 MED ORDER — ENOXAPARIN 40 MG/0.4 ML SC SYRG
40 mg | Freq: Every day | SUBCUTANEOUS | 0 refills | Status: AC
Start: 2020-07-03 — End: ?
  Administered 2020-07-03 – 2020-07-04 (×2): 40 mg via SUBCUTANEOUS

## 2020-07-03 MED ORDER — ONDANSETRON HCL (PF) 4 MG/2 ML IJ SOLN
4 mg | INTRAVENOUS | 0 refills | Status: AC | PRN
Start: 2020-07-03 — End: ?

## 2020-07-03 MED ORDER — ONDANSETRON 4 MG PO TBDI
4 mg | ORAL | 0 refills | Status: AC | PRN
Start: 2020-07-03 — End: ?

## 2020-07-03 MED ORDER — ABACAVIR SULFATE/DOLUTEGRAVIR SULFATE/LAMIVUDINE 600/50/300 MG TAB
Freq: Every day | ORAL | 0 refills | Status: AC
Start: 2020-07-03 — End: ?
  Administered 2020-07-03 – 2020-07-04 (×6): via ORAL

## 2020-07-03 MED ADMIN — NICOTINE 21 MG/24 HR TD PT24 [27863]: 1 | TRANSDERMAL | @ 05:00:00 | NDC 60505706300

## 2020-07-03 MED ADMIN — ACETAMINOPHEN 500 MG PO TAB [102]: 1000 mg | ORAL | @ 14:00:00 | NDC 00904673061

## 2020-07-03 MED ADMIN — ACETAMINOPHEN 500 MG PO TAB [102]: 1000 mg | ORAL | @ 05:00:00 | NDC 00904673061

## 2020-07-03 MED ADMIN — DOXYCYCLINE HYCLATE 100 MG PO TAB [2625]: 100 mg | ORAL | @ 17:00:00 | NDC 00904043004

## 2020-07-03 MED ADMIN — NICOTINE 21 MG/24 HR TD PT24 [27863]: 1 | TRANSDERMAL | @ 17:00:00 | NDC 60505706300

## 2020-07-03 NOTE — Telephone Encounter
LOV 05/16/20    Pt currently inpt at [facility]. Pt asking about thyroid biopsy. Pt states no one ever contacted him re: thyroid biopsy discussed at 4/29 appt. I cannot locate the orders you placed.    Routing to [place], [employer]

## 2020-07-03 NOTE — H&P (View-Only)
Admission History and Physical Examination      Today's Date: 07/02/2020   Name: Charles Walls   MRN: [id number]   Admitted:  Length of Stay 07/02/2020  LOS: 0 days                        ASSESSMENT & PLAN     Principal Problem:    Cellulitis of left lower extremity  Active Problems:    HIV (human immunodeficiency virus infection) (HCC)    Bipolar 2 disorder (HCC)    Methamphetamine use disorder, mild (HCC)    Oral herpes simplex infection    Cellulitis and abscess of left leg      Charles Walls is a 50 y.o. male with past medical history of HIV and methamphetamine use who presented to ED 6/15 for LLE redness and swelling and admitted for cellulitis.    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   - recent h/o RLE cellulitis 05/16/20 that was treated with Doxy as outpatient, also 2/2 methamphetamine injection   Plan  > 2 Sets of Blood cultures and urine culture pending   > Continue empiric IV ANTIBIOTICS, vancomycin (SOT 6/15 - )  > Maintain MAP > 65   > Monitor UOP  > may consider ortho consult for arthrocentesis of left ankle    Tachycardia  Transient chest pain  Hyperlipidemia   - 05/21/20 total calcium score 198 is at the 90th percentile rank for men age 42-50  - tachycardia likely 2/2 acute infection  - not on statin medication Plan  > f/u troponin x2  > obtain EKG  > Lipid profile w/ AM labs  ?  HIV   - Last seen in Infectious disease clinic 02/07/19 - Follows with Dr. Colon Flattery  - Last Lab:?08/17/19, Last Viral Load:?<40, Last STI testing:?03/21/19  Plan   > Continue Triumeq qday  > f/u viral load and CD4 with AM labs  ?  Methamphetamine Use Disorder   > Last used 4 days ago, injected into L calf   ?  Bipolar 2 Disorder   > Continue PTA 10mg  Abilify qday  ?  L Thyroid nodule   Submental mass  - [place] Head and neck 05/13/2020 w/ findings including submental mass (abnormal lymph node?) and highly suspicious L thyroid lobe nodule  - Recommended biopsy of both and discussed with PCP 05/16/20  - No biopsy scheduled at this point   Plan   > Ensure patient has good follow up outpatient to get biopsies scheduled and completed   ?  GERD   > Continue PTA Protonix 40 mg daily PRN      FEN:  Diet: Regular   IVF: None  Electrolytes: Monitor and replace as needed    ACCESS:  Lines: needs PIV  Urinary Catether: None    PROPHYLAXIS REVIEW:  DVT Prophylaxis:  Mechanical: Sequential compression  device  Pharmacological: Enoxaparin  GI Prophylaxis:  Indication: Not indicated by absence of high risk feature  Medications: Already on PPI for separate indication    CONSULTS PLACED DURING ADMISSION:  PT/OT: Not indicated  DIETICIAN: Not indicated  OTHER CONSULTS:    Wounds: As noted above    Code Status: DNAR-Full Intervention    Social: Lives at home. Able to complete all ADL's at baseline.     Disposition: Admitted for management of Cellulitis of left lower extremity. - Barriers to discharge: infection  - Anticipate discharge in the next 48 hours, pending clinical stability and resolution of barriers to discharge.  - Social work and nurse case management involved for discharge planning, appreciate assistance    Patient was discussed with staff physician, Dr. Lawernce Keas.      Michelene Heady, DO  Pager [phone]            Primary Care Physician: Rada Hay  Verified    Chief Complaint:    Chief Complaint   Patient presents with   ? Leg Pain     Left leg pain, injected meth in leg last week, has redness, swelling and pain.        History of Present Illness:   Charles Walls is a 50 y.o. male with PMHx of HIV on Triumeq, methamphetamine use disorder, GERD, Hyperlipidemia, and Bipolar disorder w/ h/o suicide attempt who presented to the ED with 3 to 4 days of left lower extremity redness, swelling and pain.  He reports using a clean needle to inject methamphetamine into the left calf about 4 days ago and since then has had redness that started around the area of injection, but has since spread to the entire left calf and into the ankle.  He is having subjective fevers and chills, night sweats.  He has not been on any oral antibiotics during this time.  He has been compliant with all home medications. His partner is at bedside.  He denies any use of dirty needles.  He has been using Tylenol at home for pain, which has not been very helpful.  He is having difficulty ambulating secondary to the pain and has been using crutches. He does not use any assistive devices at baseline. Patient did note transient chest pain while in the ED which he described as centrally located, non radiating, and last for about 1 minute. He denied chest pain or difficult breathing during this interview. Denies any cardiac history.     In the ED, patient was afebrile but noted to have tachycardia in the low 100's. Normal lactic acid. CBC without leukocytosis. PLT elevated at 432, but appears to be at patient's baseline. CRP and ESR elevated at 9.39 and 55, respectively. CMP unremarkable. CT of LLE showed: Diffuse subcutaneous edema and stranding of the left lower extremity without focal drainable fluid collection, which can be seen with cellulitis. He was started on Vancomycin and admitted to family medicine for further workup.         Medical History:   Diagnosis Date   ? Diverticulitis 02/02/2017   ? Fracture cervical vertebra-closed (HCC)     c6, c7 - no surgical repair   ? Gastroesophageal reflux disease without esophagitis 10/05/2019    Ordered omeprazole 40mg  qday before breakfast.  Discussed lifestyle modifications. No current red flag symptoms.  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 Human immunodeficiency virus (HIV) disease (HCC)    ? Hyperlipidemia 06/13/2013   ? Psoriasis, unspecified 02/21/2014   ?  Suicide attempt [facility])      Surgical History:   Procedure Laterality Date   ? COLONOSCOPY N/A 06/17/2015    Performed by Jolee Ewing, MD at [facility] ENDO   ? ROBOT ASSISTED LAPAROSCOPIC NEPHRECTOMY PARTIAL WITH INTRAOPERATIVE ULTRASOUND  Left 06/09/2017    Performed by Daryl Eastern, MD at [facility] OR   ? HERNIA REPAIR     ? HX APPENDECTOMY     ? PR LAPAROSCOPY SURG RPR INITIAL INGUINAL HERNIA      right     Family history reviewed; non-contributory  Family History   Problem Relation Age of Onset   ? Melanoma Father    ? Cancer Father         oral mandibular   ? High Cholesterol Mother    ? Cancer-Colon Maternal Aunt 70   ? Diabetes Neg Hx    ? Hypertension Neg Hx    ? Cancer-Breast Neg Hx    ? Cancer-Ovarian Neg Hx    ? Cancer-Prostate Neg Hx      Social History     Socioeconomic History   ? Marital status: Divorced     Spouse name: Not on file   ? Number of children: Not on file   ? Years of education: Not on file   ? Highest education level: Not on file   Occupational History   ? Occupation: unemployed   Tobacco Use   ? Smoking status: Former Smoker     Packs/day: 1.00     Years: 25.00     Pack years: 25.00     Types: Cigarettes     Quit date: 01/2017     Years since quitting: 3.4   ? Smokeless tobacco: Never Used   ? Tobacco comment: vapeing   Vaping Use   ? Vaping Use: Former   Substance and Sexual Activity   ? 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 Drug use: Yes     Types: Methamphetamines, IV     Comment: IV crystal meth. Last use was within the past 24 hours.   ? Sexual activity: Yes     Partners: Male   Other Topics Concern   ? Not on file   Social History Narrative    Pagan     Social Determinants of Health     Financial Resource Strain: Not At Risk   ? Utilities Shut off in last Year: No   ? Difficulty Obtaining Child Care: No   ? Lack of Money for Doctor Visit: No   ? Skipped Medication to [employer] Money: No   Food Insecurity: Not on file   Transportation Needs: Not At Risk   ? Lack of Transportation for Health Care: No   Stress: Not on file   Social Connections: Not At Risk   ? Lack Companionship: No   Housing Stability: Not At Risk   ? Concern with Stable Housing : No      Immunizations (includes history and patient reported):   Immunization History   Administered Date(s) Administered   ? COVID-19 (PFIZER), mRNA vacc, 30 mcg/0.3 mL (PF) 05/07/2019, 05/28/2019   ? Flu Vaccine =>65 YO High-Dose (PF) 02/11/2016, 11/24/2016   ? Flu Vaccine =>65 YO High-Dose Quadrivalent (PF) 02/07/2019   ? Flu Vaccine Trivalent =>3 Yo (Preservative Free) 10/04/2013   ? Hepatitis B Vaccine Adult 3 Dose IM 10/01/2013   ? Meningococcal Conjug Vaccine  IM (MenACWY-CRM)(Menveo) 06/18/2015, 02/11/2016   ? Pneumococcal Vaccine (23-Val Adult) 10/01/2013   ? Pneumococcal Vaccine(13-Val Peds/immunocompromised adult) 07/31/2013   ? Tdap Vaccine 05/11/2013, 08/09/2019           Allergies:  Sulfa (sulfonamide antibiotics)    Medications:  (Not in a hospital admission)      Review of Systems:  Review of Systems   Constitutional: Positive for chills, diaphoresis and fever. Negative for weight loss.   Respiratory: Negative for cough and shortness of breath.    Cardiovascular: Positive for leg swelling.   Gastrointestinal: Positive for constipation. Negative for abdominal pain, diarrhea and heartburn.   Genitourinary: Negative for dysuria and urgency.   Musculoskeletal: Positive for joint pain (left ankle).   Psychiatric/Behavioral: Positive for substance abuse.   All other systems reviewed and are negative.       Physical Exam:    Vital Signs: Last Filed In 24 Hours Vital Signs: 24 Hour Range   BP: 115/75 (06/15 2151)  Temp: 36.7 ?C (98.1 ?F) (06/15 1606)  Respirations: 14 PER MINUTE (06/15 1606)  SpO2: 96 % (06/15 2151)  O2 Device: None (Room air) (06/15 1606)  SpO2 Pulse: 104 (06/15 2151)  Height: 162.6 cm (5' 4) (06/15 1606) BP: (114-120)/(48-78)   Temp:  [36.7 ?C (98.1 ?F)]   Respirations:  [14 PER MINUTE]   SpO2:  [96 %-100 %]   O2 Device: None (Room air)   Intensity Pain Scale (Self Report): 6 (07/02/20 1615)      Physical Exam  Vitals and nursing note reviewed.   Constitutional:       Appearance: He is ill-appearing.   HENT:      Head: Normocephalic and atraumatic.      Mouth/Throat:      Mouth: Mucous membranes are moist.   Eyes:      General: No scleral icterus.     Extraocular Movements: Extraocular movements intact.      Conjunctiva/sclera: Conjunctivae normal.   Cardiovascular:      Rate and Rhythm: Regular rhythm. Tachycardia present.      Pulses: Normal pulses.   Pulmonary:      Effort: Pulmonary effort is normal.      Breath sounds: Normal breath sounds.   Abdominal:      General: Abdomen is flat.      Palpations: Abdomen is soft.   Musculoskeletal:         General: Swelling and tenderness present.   Skin:            Comments: Area of erythema and increased warmth, no open wounds or bleeding    Neurological:      General: No focal deficit present.      Mental Status: He is alert and oriented to person, place, and time.   Psychiatric:         Mood and Affect: Mood normal.         Behavior: Behavior normal.         Thought Content: Thought content normal.           Lab/Radiology/Other Diagnostic Tests:  24-hour labs:    Results for orders placed or performed during the hospital encounter of 07/02/20 (from the past 24 hour(s))   CBC AND DIFF    Collection Time: 07/02/20  7:51 PM   Result Value Ref Range    White Blood Cells 9.8 4.5 - 11.0 K/UL    RBC 4.93 4.4 - 5.5 M/UL    Hemoglobin 14.9 13.5 -  16.5 GM/DL    Hematocrit 19.1 40 - 50 %    MCV 88.8 80 - 100 FL    MCH 30.3 26 - 34 PG    MCHC 34.1 32.0 - 36.0 G/DL    RDW 47.8 (H) 11 - 15 %    Platelet Count 432 (H) 150 - 400 K/UL    MPV 7.1 7 - 11 FL    Neutrophils 68 41 - 77 %    Lymphocytes 18 (L) 24 - 44 %    Monocytes 10 4 - 12 %    Eosinophils 3 0 - 5 %    Basophils 1 0 - 2 %    Absolute Neutrophil Count 6.73 1.8 - 7.0 K/UL    Absolute Lymph Count 1.75 1.0 - 4.8 K/UL    Absolute Monocyte Count 0.97 (H) 0 - 0.80 K/UL    Absolute Eosinophil Count 0.30 0 - 0.45 K/UL    Absolute Basophil Count 0.07 0 - 0.20 K/UL    MDW (Monocyte Distribution Width) 22.5 (H) <20.7   COMPREHENSIVE METABOLIC PANEL    Collection Time: 07/02/20  7:51 PM   Result Value Ref Range    Sodium 137 137 - 147 MMOL/L    Potassium 4.4 3.5 - 5.1 MMOL/L    Chloride 101 98 - 110 MMOL/L    Glucose 80 70 - 100 MG/DL    Blood Urea Nitrogen 21 7 - 25 MG/DL    Creatinine 2.95 0.4 - 1.24 MG/DL    Calcium 9.6 8.5 - 62.1 MG/DL    Total Protein 7.9 6.0 - 8.0 G/DL    Total Bilirubin 0.3 0.3 - 1.2 MG/DL    Albumin 4.1 3.5 - 5.0 G/DL    Alk Phosphatase 79 25 - 110 U/L    AST (SGOT) 15 7 - 40 U/L    CO2 25 21 - 30 MMOL/L    ALT (SGPT) 11 7 - 56 U/L    Anion Gap 11 3 - 12    eGFR >60 >60 mL/min   C REACTIVE PROTEIN (CRP)    Collection Time: 07/02/20  7:51 PM   Result Value Ref Range    C-Reactive Protein 9.39 (H) <1.0 MG/DL   SED RATE    Collection Time: 07/02/20  7:51 PM   Result Value Ref Range    Sed Rate -ESR 55 (H) 0 - 20 MM/HR   POC LACTATE    Collection Time: 07/02/20  7:52 PM   Result Value Ref Range    LACTIC ACID POC 0.9 0.5 - 2.0 MMOL/L     Glucose: 80 (07/02/20 1951)  CT LOWER EXTREM WO CONT LEFT   Final Result         Diffuse subcutaneous edema and stranding of the left lower extremity without focal drainable fluid collection, which can be seen with cellulitis.      By my electronic signature, I attest that I have personally reviewed the images for this examination and formulated the interpretations and opinions expressed in this report          Finalized by Earlyne Iba, MD, PhD on 07/02/2020 9:02 PM. Dictated by Precious Reel, M.D. on 07/02/2020 8:16 PM.             Michelene Heady, DO  Resident Physician PGY-1  Family Medicine Residency  [facility]   Pager: [phone]  FM-1 Team Pager: [phone]

## 2020-07-03 NOTE — ED Notes
Unable to obtain vascular access with ultrasound at this time. Vascular access team consulted.

## 2020-07-04 ENCOUNTER — Encounter: Admit: 2020-07-04 | Discharge: 2020-07-04 | Payer: No Typology Code available for payment source | Primary: Family

## 2020-07-04 DIAGNOSIS — E041 Nontoxic single thyroid nodule: Secondary | ICD-10-CM

## 2020-07-04 DIAGNOSIS — R221 Localized swelling, mass and lump, neck: Secondary | ICD-10-CM

## 2020-07-04 MED ADMIN — DOXYCYCLINE HYCLATE 100 MG PO TAB [2625]: 100 mg | ORAL | @ 02:00:00 | NDC 00904043004

## 2020-07-04 MED ADMIN — DOXYCYCLINE HYCLATE 100 MG PO TAB [2625]: 100 mg | ORAL | @ 14:00:00 | Stop: 2020-07-04 | NDC 00143211205

## 2020-07-04 MED ADMIN — NICOTINE 21 MG/24 HR TD PT24 [27863]: 1 | TRANSDERMAL | @ 14:00:00 | Stop: 2020-07-04 | NDC 60505706300

## 2020-07-04 MED ADMIN — ACETAMINOPHEN 500 MG PO TAB [102]: 1000 mg | ORAL | @ 02:00:00 | NDC 00904673061

## 2020-07-07 ENCOUNTER — Encounter: Admit: 2020-07-07 | Discharge: 2020-07-07 | Payer: No Typology Code available for payment source | Primary: Family

## 2020-07-07 NOTE — Telephone Encounter
Hospital Discharge Follow Up      Reached Patient:Yes  Patient Date of Birth: 12/30/1970     Admission Information:     Hospital Name: [facility]  Admission Date: 07/02/20  Discharge Date: 07/04/20  Admission Diagnosis: Cellulitis and abscess of left leg  Discharge Diagnosis: Cellulitis and abscess of left leg  Has there been a discharge within the last 30 days? No    Hospital Services: Unplanned  Today's call is 1(business) days post discharge      Discharge Instruction Review   Did patient receive and understand discharge instructions? Yes    Home Health ordered? No                 Agency name/telephone number: NA   Has Home Health agency contacted patient? No   Caregiver assistance in the home? No   Are there concerns regarding the patient's ADL'S? No  Is patient a fall risk? No    Special diet? No If yes, type: reg      Medication Reconciliation    Changes to pre-hospital medications? Yes     START taking:  doxycycline hyclate (VIBRACIN)  nicotine (NICODERM CQ STEP 1)    Were new prescriptions filled?Yes  Meds reviewed and reconciled?No  ? acetaminophen (TYLENOL) 325 mg tablet Take 325 mg by mouth every 6 hours as needed for Pain.   ? ARIPiprazole (ABILIFY) 10 mg tablet TAKE 1 TABLET BY MOUTH DAILY   ? doxycycline hyclate (VIBRACIN) 100 mg tablet Take one tablet by mouth twice daily. Indications: skin and skin structure infection   ? lidocaine hcl viscous (LIDOCAINE VISCOUS) 2 % solution Swish and Spit 5 mL by mouth as directed four times daily as needed.   ? naproxen sodium (ALEVE PO) Take 2 tablets by mouth as Needed.   ? nicotine (NICODERM CQ STEP 1) 21 mg/day patch Apply one patch to top of skin as directed daily. Rotate patch location.  Indications: stop smoking   ? omeprazole DR (PRILOSEC) 40 mg capsule Take one capsule by mouth daily before breakfast.   ? TRIUMEQ 600-50-300 mg tablet TAKE 1 TABLET BY MOUTH DAILY   ? valACYclovir (VALTREX) 1 gram tablet TAKE 2 TABLETS BY MOUTH EVERY 12 HOURS FOR 4 DAYS. BEGIN AT ONSET OF COLD SORE   ? vitamins, multiple cap Take 1 capsule by mouth daily.         Understanding Condition   Having any current symptoms? No  Patient understands when to seek additional medical care? Yes   Other instructions provided : Follow up with PCP with any concerns     Scheduling Follow-up Appointment   Upcoming appointment date and time and with whom scheduled:   Future Appointments   Date Time Provider Department Center   10/22/2020  4:30 PM Rada Hay, APRN-NP [facility]     PCP appointment scheduled?No, patient declined appointment. He reports he already spoke with PCP and no HFU was required.  PCP primary location: [facility]  Specialist appointment scheduled? No  Both PCP and Specialist appointment scheduled: No  Is assistance with transportation needed?No   MyChart message sent? Active in MyChart. No message sent.     Olegario Shearer, RN

## 2020-07-08 NOTE — Progress Notes
Charles ROGERSON is a 50 y.o. male.    Chief Complaint   Patient presents with   ? Post-hospital Follow Up     Hospital follow up due to cellulitis in the LEFT leg   ? Referral     Patient would like to do a Cologuard if possible.            Subjective:            HPI    Charles Walls presents to discuss the following:    Hospital Discharge Follow-Up:  Hospital Name: [facility]  Admission Date: 07/02/2020  Admission Diagnosis: Cellulitis of left lower extremity  Discharge Date: 07/04/2020  Discharge Diagnosis: Cellulitis of left lower extremity  Records available at the time of visit? YES    Hospital discharge summary:  Charles Walls presented to ED 6/15 for LLE redness and swelling and admitted for cellulitis.  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  Developed erythema and swelling.  Diagnosed with cellulitis.  ESR and CRP were significantly elevated.  A CT of the left lower extremity does show diffuse subcutaneous edema without any focal fluid collection.  Patient has history of cellulitis in the past that was treated with doxycycline successfully.  Blood cultures were negative.  Completed dose of vancomycin on 6/15 and transition to doxycycline for total of 10-day course.  Patient tolerated antibiotics well. No further leg complaints.    Charles Walls reports he stopped using meth on Monday, June 13th. He was previously using 3-4 times per day. Reports this last episode of cellulitis was very painful and scared him. He has a close friend who is acting as his sponsor and he is in a relationship with a new male partner who is not a user. He is hopeful with this support he will be able to remain sober.       Review of Systems   Constitutional: Negative for chills, fatigue, fever and unexpected weight change.   Eyes: Negative for visual disturbance.   Respiratory: Negative for cough, chest tightness, shortness of breath and wheezing.    Cardiovascular: Negative for chest pain, palpitations and leg swelling.   Neurological: Negative for dizziness, syncope, light-headedness, numbness and headaches.   Psychiatric/Behavioral: Negative for dysphoric mood and suicidal ideas. The patient is not nervous/anxious.    All other systems reviewed and are negative.          Your Current Medications:       Instructions    acetaminophen (TYLENOL) 325 mg tablet Take 325 mg by mouth every 6 hours as needed for Pain.    ARIPiprazole (ABILIFY) 10 mg tablet TAKE 1 TABLET BY MOUTH DAILY    doxycycline hyclate (VIBRACIN) 100 mg tablet Take one tablet by mouth twice daily. Indications: skin and skin structure infection    lidocaine hcl viscous (LIDOCAINE VISCOUS) 2 % solution Swish and Spit 5 mL by mouth as directed four times daily as needed.    naproxen sodium (ALEVE PO) Take 2 tablets by mouth as Needed.    nicotine (NICODERM CQ STEP 1) 21 mg/day patch Apply one patch to top of skin as directed daily. Rotate patch location.  Indications: stop smoking    omeprazole DR (PRILOSEC) 40 mg capsule Take one capsule by mouth daily before breakfast.    TRIUMEQ 600-50-300 mg tablet TAKE 1 TABLET BY MOUTH DAILY    valACYclovir (VALTREX) 1 gram tablet TAKE 2 TABLETS BY MOUTH EVERY 12 HOURS FOR 4 DAYS.  BEGIN AT ONSET OF COLD SORE    vitamins, multiple cap Take 1 capsule by mouth daily.          Medications Discontinued During This Encounter   Medication Reason   ? lidocaine hcl viscous (LIDOCAINE VISCOUS) 2 % solution    ? naproxen sodium (ALEVE PO)    ? omeprazole DR (PRILOSEC) 40 mg capsule Patient's Choice         Objective:          Vitals:    07/09/20 1516   BP: 118/64   BP Source: Arm, Right Upper   Pulse: 93   Temp: 36.6 ?C (97.9 ?F)   SpO2: 97%   TempSrc: Oral   PainSc: Zero   Weight: 69.4 kg (153 lb)   Height: 163 cm (5' 4.17)     Body mass index is 26.12 kg/m?Marland Kitchen   No LMP for male patient.      Physical Exam  Vitals and nursing note reviewed.   Constitutional:       Appearance: Normal appearance.   HENT:      Head: Normocephalic and atraumatic.      Right Ear: External ear normal.      Left Ear: External ear normal.      Nose: Nose normal. No congestion or rhinorrhea.   Eyes:      Extraocular Movements: Extraocular movements intact.   Cardiovascular:      Rate and Rhythm: Normal rate and regular rhythm.      Heart sounds: Normal heart sounds.   Pulmonary:      Effort: Pulmonary effort is normal.      Breath sounds: Normal breath sounds.   Musculoskeletal:         General: Normal range of motion.      Cervical back: Normal range of motion and neck supple.   Skin:     General: Skin is warm and dry.      Findings: No rash (on visible skin).   Neurological:      General: No focal deficit present.      Mental Status: He is alert and oriented to person, place, and time.   Psychiatric:         Mood and Affect: Mood normal.         Behavior: Behavior normal.         Thought Content: Thought content normal.         Judgment: Judgment normal.         Results for orders placed or performed during the hospital encounter of 07/02/20 (from the past 336 hour(s))   CBC AND DIFF   Result Value Ref Range    White Blood Cells 9.8 4.5 - 11.0 K/UL    RBC 4.93 4.4 - 5.5 M/UL    Hemoglobin 14.9 13.5 - 16.5 GM/DL    Hematocrit 69.6 40 - 50 %    MCV 88.8 80 - 100 FL    MCH 30.3 26 - 34 PG    MCHC 34.1 32.0 - 36.0 G/DL    RDW 29.5 (H) 11 - 15 %    Platelet Count 432 (H) 150 - 400 K/UL    MPV 7.1 7 - 11 FL    Neutrophils 68 41 - 77 %    Lymphocytes 18 (L) 24 - 44 %    Monocytes 10 4 - 12 %    Eosinophils 3 0 - 5 %    Basophils 1 0 - 2 %  Absolute Neutrophil Count 6.73 1.8 - 7.0 K/UL    Absolute Lymph Count 1.75 1.0 - 4.8 K/UL    Absolute Monocyte Count 0.97 (H) 0 - 0.80 K/UL    Absolute Eosinophil Count 0.30 0 - 0.45 K/UL    Absolute Basophil Count 0.07 0 - 0.20 K/UL    MDW (Monocyte Distribution Width) 22.5 (H) <20.7   COMPREHENSIVE METABOLIC PANEL   Result Value Ref Range    Sodium 137 137 - 147 MMOL/L    Potassium 4.4 3.5 - 5.1 MMOL/L    Chloride 101 98 - 110 MMOL/L    Glucose 80 70 - 100 MG/DL    Blood Urea Nitrogen 21 7 - 25 MG/DL    Creatinine 1.61 0.4 - 1.24 MG/DL    Calcium 9.6 8.5 - 09.6 MG/DL    Total Protein 7.9 6.0 - 8.0 G/DL    Total Bilirubin 0.3 0.3 - 1.2 MG/DL    Albumin 4.1 3.5 - 5.0 G/DL    Alk Phosphatase 79 25 - 110 U/L    AST (SGOT) 15 7 - 40 U/L    CO2 25 21 - 30 MMOL/L    ALT (SGPT) 11 7 - 56 U/L    Anion Gap 11 3 - 12    eGFR >60 >60 mL/min   CULTURE-BLOOD W/SENSITIVITY    Specimen: Blood,Peripheral    RIGHT~ARM   Result Value Ref Range    Battery Name BLOOD CULTURE     Report Status FINAL 07/08/2020     Specimen Description BLOOD BLOOD, PERIPHERAL RIGHT ARM      Special Requests No special requests     Culture NO GROWTH 5 DAYS    CULTURE-BLOOD W/SENSITIVITY    Specimen: Blood,Peripheral    DISTAL   Result Value Ref Range    Battery Name BLOOD CULTURE     Report Status FINAL 07/08/2020     Specimen Description BLOOD BLOOD, PERIPHERAL DISTAL      Special Requests No special requests     Culture NO GROWTH 5 DAYS    C REACTIVE PROTEIN (CRP)   Result Value Ref Range    C-Reactive Protein 9.39 (H) <1.0 MG/DL   SED RATE   Result Value Ref Range    Sed Rate -ESR 55 (H) 0 - 20 MM/HR   POC LACTATE   Result Value Ref Range    LACTIC ACID POC 0.9 0.5 - 2.0 MMOL/L   TROPONIN-I   Result Value Ref Range    Troponin-I 0.01 0.0 - 0.05 NG/ML   CD4 ABSOLUTE CELL COUNT   Result Value Ref Range    CD4 Count 833 440 - 2,160    CD4 % 43.9 28 - 63 %   HIV VIRAL LOAD PCR QUANT   Result Value Ref Range    HIV-1, copies/mL HIV-1 RNA Not Detected <40   LIPID PROFILE   Result Value Ref Range    Cholesterol 141 <200 MG/DL    Triglycerides 84 <045 MG/DL    HDL 30 (L) >40 MG/DL    LDL 981 (H) <191 mg/dL    VLDL 17 MG/DL    Non HDL Cholesterol 111 MG/DL   HEMOGLOBIN Y7W   Result Value Ref Range    Hemoglobin A1C 5.4 4.0 - 6.0 %   TROPONIN-I   Result Value Ref Range    Troponin-I 0.01 0.0 - 0.05 NG/ML   COMPREHENSIVE METABOLIC PANEL   Result Value Ref Range    Sodium 138 137 - 147 MMOL/L  Potassium 4.6 3.5 - 5.1 MMOL/L    Chloride 103 98 - 110 MMOL/L    Glucose 110 (H) 70 - 100 MG/DL    Blood Urea Nitrogen 22 7 - 25 MG/DL    Creatinine 1.61 0.4 - 1.24 MG/DL    Calcium 9.0 8.5 - 09.6 MG/DL    Total Protein 6.5 6.0 - 8.0 G/DL    Total Bilirubin 0.3 0.3 - 1.2 MG/DL    Albumin 3.6 3.5 - 5.0 G/DL    Alk Phosphatase 76 25 - 110 U/L    AST (SGOT) 16 7 - 40 U/L    CO2 23 21 - 30 MMOL/L    ALT (SGPT) 10 7 - 56 U/L    Anion Gap 12 3 - 12    eGFR >60 >60 mL/min   CBC AND DIFF   Result Value Ref Range    White Blood Cells 6.8 4.5 - 11.0 K/UL    RBC 4.64 4.4 - 5.5 M/UL    Hemoglobin 13.7 13.5 - 16.5 GM/DL    Hematocrit 04.5 40 - 50 %    MCV 88.7 80 - 100 FL    MCH 29.5 26 - 34 PG    MCHC 33.3 32.0 - 36.0 G/DL    RDW 40.9 (H) 11 - 15 %    Platelet Count 434 (H) 150 - 400 K/UL    MPV 7.5 7 - 11 FL    Neutrophils 51 41 - 77 %    Lymphocytes 30 24 - 44 %    Monocytes 15 (H) 4 - 12 %    Eosinophils 4 0 - 5 %    Basophils 0 0 - 2 %    Absolute Neutrophil Count 3.43 1.8 - 7.0 K/UL    Absolute Lymph Count 2.06 1.0 - 4.8 K/UL    Absolute Monocyte Count 0.99 (H) 0 - 0.80 K/UL    Absolute Eosinophil Count 0.26 0 - 0.45 K/UL    Absolute Basophil Count 0.03 0 - 0.20 K/UL   TSH WITH FREE T4 REFLEX   Result Value Ref Range    TSH 1.29 0.35 - 5.00 MCU/ML   CBC AND DIFF   Result Value Ref Range    White Blood Cells 4.7 4.5 - 11.0 K/UL    RBC 4.69 4.4 - 5.5 M/UL    Hemoglobin 13.7 13.5 - 16.5 GM/DL    Hematocrit 81.1 40 - 50 %    MCV 89.6 80 - 100 FL    MCH 29.2 26 - 34 PG    MCHC 32.6 32.0 - 36.0 G/DL    RDW 91.4 (H) 11 - 15 %    Platelet Count 425 (H) 150 - 400 K/UL    MPV 7.3 7 - 11 FL    Neutrophils 37 (L) 41 - 77 %    Lymphocytes 43 24 - 44 %    Monocytes 13 (H) 4 - 12 %    Eosinophils 6 (H) 0 - 5 %    Basophils 1 0 - 2 %    Absolute Neutrophil Count 1.75 (L) 1.8 - 7.0 K/UL    Absolute Lymph Count 2.04 1.0 - 4.8 K/UL    Absolute Monocyte Count 0.63 0 - 0.80 K/UL    Absolute Eosinophil Count 0.28 0 - 0.45 K/UL    Absolute Basophil Count 0.04 0 - 0.20 K/UL            Assessment and Plan:  Charles Walls was seen today for post-hospital follow up and referral.    Diagnoses and all orders for this visit:    Hospital discharge follow-up  Cellulitis of left lower extremity  Reviewed hospitalization records.   Symptoms have resolved since discharge. He continues to take doxycycline as prescribed.    Methamphetamine use disorder, mild (HCC)  He has been sober now for 9 days. Reports good support from his friend and partner who will hold him accountable.     Screen for colon cancer  -     COLOGUARD (DNA)-CLINIC ONLY  Declines colonoscopy. Willing to do Cologuard.     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  -     LIPID PROFILE; Future; Expected date: 07/09/2020  -     LIVER FUNCTION PANEL; Future; Expected date: 08/20/2020  Mixed hyperlipidemia  -     atorvastatin (LIPITOR) 10 mg tablet; Take one tablet by mouth daily.  Calcification of aortic valve  -     atorvastatin (LIPITOR) 10 mg tablet; Take one tablet by mouth daily.  Reviewed recent labs and CAC Score.   Given risk factors and the findings on his diagnostic testing, initiate atorvastatin.   F/U in for repeat labs as directed.       I reviewed with the patient their current medications and specifically any new medications prescribed at the time of this visit and we reviewed the expected benefits and potential side effects. All questions are answered to the patient's satisfaction.    Patient education provided regarding diagnosis, course, and treatment. Patient was in agreement to instructions, POC, and will call if questions or concerns arise.     Total Time Today was 35 minutes in the following activities: Preparing to see the patient, Obtaining and/or reviewing separately obtained history, Performing a medically appropriate examination and/or evaluation, Counseling and educating the patient/family/caregiver, Ordering medications, tests, or procedures, Documenting clinical information in the electronic or other health record and Independently interpreting results (not separately reported) and communicating results to the patient/family/caregiver        Return for pending appointment in October.      Future Appointments   Date Time Provider Department Center   10/22/2020  4:30 PM Deanne Coffer [facility]

## 2020-07-09 ENCOUNTER — Ambulatory Visit: Admit: 2020-07-09 | Discharge: 2020-07-09 | Payer: No Typology Code available for payment source | Primary: Family

## 2020-07-09 ENCOUNTER — Encounter: Admit: 2020-07-09 | Discharge: 2020-07-09 | Payer: No Typology Code available for payment source | Primary: Family

## 2020-07-09 DIAGNOSIS — E785 Hyperlipidemia, unspecified: Secondary | ICD-10-CM

## 2020-07-09 DIAGNOSIS — R931 Abnormal findings on diagnostic imaging of heart and coronary circulation: Secondary | ICD-10-CM

## 2020-07-09 DIAGNOSIS — B20 Human immunodeficiency virus [HIV] disease: Secondary | ICD-10-CM

## 2020-07-09 DIAGNOSIS — K219 Gastro-esophageal reflux disease without esophagitis: Secondary | ICD-10-CM

## 2020-07-09 DIAGNOSIS — T1491XA Suicide attempt, initial encounter: Secondary | ICD-10-CM

## 2020-07-09 DIAGNOSIS — E782 Mixed hyperlipidemia: Secondary | ICD-10-CM

## 2020-07-09 DIAGNOSIS — L409 Psoriasis, unspecified: Secondary | ICD-10-CM

## 2020-07-09 DIAGNOSIS — Z09 Encounter for follow-up examination after completed treatment for conditions other than malignant neoplasm: Secondary | ICD-10-CM

## 2020-07-09 DIAGNOSIS — S129XXA Fracture of neck, unspecified, initial encounter: Secondary | ICD-10-CM

## 2020-07-09 DIAGNOSIS — Z1211 Encounter for screening for malignant neoplasm of colon: Secondary | ICD-10-CM

## 2020-07-09 DIAGNOSIS — K5792 Diverticulitis of intestine, part unspecified, without perforation or abscess without bleeding: Secondary | ICD-10-CM

## 2020-07-09 DIAGNOSIS — I359 Nonrheumatic aortic valve disorder, unspecified: Secondary | ICD-10-CM

## 2020-07-09 DIAGNOSIS — L03116 Cellulitis of left lower limb: Secondary | ICD-10-CM

## 2020-07-09 DIAGNOSIS — F1510 Other stimulant abuse, uncomplicated: Secondary | ICD-10-CM

## 2020-07-09 MED ORDER — ATORVASTATIN 10 MG PO TAB
10 mg | ORAL_TABLET | Freq: Every day | ORAL | 0 refills | Status: AC
Start: 2020-07-09 — End: ?

## 2020-07-09 NOTE — Patient Instructions
It was a pleasure to see you again, Charles Walls.     I am so proud of you for your efforts and I have confidence in your continued success.     I have sent atorvastatin 10 mg to your pharmacy to begin to take daily to reduce your cholesterol and minimize further build-up of plaque.   Please return to the office in 3 months to repeat labs. PLEASE COME FASTING for 8 HOURS (nothing to eat or drink aside from water or black coffee). NO APPOINTMENT NEEDED. OUR WALK IN LAB HOURS: MON - FRI 8am-11:30am, 1pm-4:30pm.

## 2020-07-12 ENCOUNTER — Encounter: Admit: 2020-07-12 | Discharge: 2020-07-12 | Payer: No Typology Code available for payment source | Primary: Family

## 2020-07-12 DIAGNOSIS — I359 Nonrheumatic aortic valve disorder, unspecified: Secondary | ICD-10-CM

## 2020-07-12 DIAGNOSIS — R931 Abnormal findings on diagnostic imaging of heart and coronary circulation: Secondary | ICD-10-CM

## 2020-07-12 DIAGNOSIS — E782 Mixed hyperlipidemia: Secondary | ICD-10-CM

## 2020-07-12 MED ORDER — ATORVASTATIN 10 MG PO TAB
ORAL_TABLET | Freq: Every day | 0 refills
Start: 2020-07-12 — End: ?

## 2020-07-17 ENCOUNTER — Encounter: Admit: 2020-07-17 | Discharge: 2020-07-17 | Payer: No Typology Code available for payment source

## 2020-07-17 MED ORDER — ARIPIPRAZOLE 10 MG PO TAB
ORAL_TABLET | Freq: Every day | 2 refills
Start: 2020-07-17 — End: ?

## 2020-07-24 ENCOUNTER — Encounter: Admit: 2020-07-24 | Discharge: 2020-07-24 | Payer: No Typology Code available for payment source

## 2020-07-24 NOTE — Telephone Encounter
LOV 07/09/20    Pt wants to know what he can take for cold OTC. Pt states his home COVID tests were neg. Has sore and hoarse throat, joint pain, runny nose, muscle aches. Advised he can take acetaminophen/ibuprofen according to pkg instructions. Also advised saline spray and lots of non-caffeinated fluids and rest. Advised if he develops a cough, Delsym cough syrup can help. Pt v/u. Advised to call back if his s/s worsen. Pt v/u.

## 2020-07-28 ENCOUNTER — Encounter: Admit: 2020-07-28 | Discharge: 2020-07-28 | Payer: No Typology Code available for payment source

## 2020-07-29 ENCOUNTER — Encounter: Admit: 2020-07-29 | Discharge: 2020-07-29 | Payer: No Typology Code available for payment source

## 2020-07-29 ENCOUNTER — Ambulatory Visit: Admit: 2020-07-29 | Discharge: 2020-07-29 | Payer: No Typology Code available for payment source

## 2020-07-29 DIAGNOSIS — R221 Localized swelling, mass and lump, neck: Secondary | ICD-10-CM

## 2020-07-29 DIAGNOSIS — E041 Nontoxic single thyroid nodule: Principal | ICD-10-CM

## 2020-07-29 NOTE — Patient Instructions
INTERVENTIONAL RADIOLOGY DISCHARGE INSTRUCTIONS  PERCUTANEOUS BIOPSY  A percutaneous biopsy is a procedure in which a tiny sample of tissue is taken by using a needle inserted through your skin (percutaneously) into the affected area.?This procedure may also be referred to as a ?fine needle aspiration?. The radiologist will determine whether the procedure is to be done using ultrasound or CT guidance.?  POST-PROCEDURE PAIN:   ? Pain control following your procedure is a priority for both you and your Physicians.  ? Some soreness or tenderness at the site is to be expected for several days. We recommend taking over the counter analgesics to help relieve this pain.  ? Alternative methods for pain relief include but not limited to heat or cold compress, relaxation techniques, rest, and changing of positions.?  ? If pain continues after 5-7 days or you have severe pain not relieved by medication, please contact us as directed below.?  POST-PROCEDURE ACTIVITY:   ? A responsible adult must drive you home. ???  ? If you receive sedation, narcotic pain medication or anesthesia for the procedure, you should not drive or operate heavy machinery or do anything that requires concentration for at least 24 hours after procedure completion.  ? It is recommended that a responsible adult be with you until morning.  ? Other activity restrictions such as lifting restrictions will depend upon the biopsy area and will be determined by the physician after your procedure.?  POST-PROCEDURE SITE CARE:  ? You will have a small bandage over the procedure site.? Keep this dry.?  ? You may remove it in 24 hours.  ? You may shower in 24 hours, after removing the bandage.  ? Do not submerge the procedure site for 1 week (no bathtub, swimming, hot tub, etc.)  ? Do not use ointments, creams or powders on the puncture site.  ? Be sure your hands are clean when touching near the site.?  DIET/MEDICATIONS:   ? You may resume your previous diet after the procedure.  ? If you receive sedation or narcotic pain medications, avoid any foods or beverages containing alcohol for at least 24 hours after the procedure.  ? Please see the Medication Reconciliation sheet for instructions regarding resuming your home medications.?  CALL THE DOCTOR IF:   ? Bright red blood soaks the bandage.  ? You have pain not relieved by medication.? Some soreness at the site is to be expected.  ? You have signs of infection such as: fever greater than 101F, chills, redness, warmth, swelling, drainage or pus from the puncture site.  ?  For any of the above symptoms or for problems or concerns related to the procedure performed at the [place] location, call?[phone] Monday-Friday from 7-5p.? After-hours and weekends, please call?[phone] and ask for the Interventional Radiology Resident on-call.  YOU OR YOUR CAREGIVER SHOULD CALL 911 FOR ANY SEVERE SYMPTOMS SUCH AS EXCESSIVE BLEEDING, SEVERE DIZZINESS, TROUBLE BREATHING OR LOSS OF CONSCIOUSNESS.  ?  ?

## 2020-07-29 NOTE — H&P (View-Only)
IR Pre-Procedure History and Physical/Sedation Plan    Procedure Date: 07/29/2020     Planned Procedure(s):  Left thryoid nodule FNA; Submental nodule FNA    Indication:  Workup  __________________________________________________________________    Chief Complaint:  Thyroid nodule and adenopathy in setting of HIV, RCC hx    History of Present Illness: Charles Walls is a 50 y.o. male with a history as listed below who presents today for procedure.    Patient Active Problem List    Diagnosis Date Noted   ? 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 Cellulitis of left lower extremity 07/02/2020   ? 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 Methamphetamine use disorder, mild (HCC) 03/25/2019   ? Sepsis (HCC) 03/20/2019   ? Lymphadenopathy, inguinal 01/11/2018   ? 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 Renal cell cancer, left (HCC) 02/04/2017   ? Calcification of aortic valve 02/04/2017   ? Right-sided tinnitus 06/09/2016   ? Alcohol abuse 06/11/2015   ? Bipolar 2 disorder (HCC) 04/15/2014   ? HIV (human immunodeficiency virus infection) (HCC) 06/13/2013   ? Hyperlipidemia 06/13/2013   ? Tobacco abuse 06/13/2013     Medical History:   Diagnosis Date   ? Diverticulitis 02/02/2017   ? Fracture cervical vertebra-closed (HCC)     c6, c7 - no surgical repair   ? Gastroesophageal reflux disease without esophagitis 10/05/2019    Ordered omeprazole 40mg  qday before breakfast.  Discussed lifestyle modifications. No current red flag symptoms.  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 Human immunodeficiency virus (HIV) disease (HCC)    ? Hyperlipidemia 06/13/2013   ? Psoriasis, unspecified 02/21/2014   ? Suicide attempt [facility])       Surgical History:   Procedure Laterality Date   ? COLONOSCOPY N/A 06/17/2015    Performed by Jolee Ewing, MD at [facility] ENDO   ? ROBOT ASSISTED LAPAROSCOPIC NEPHRECTOMY PARTIAL WITH INTRAOPERATIVE ULTRASOUND  Left 06/09/2017    Performed by Daryl Eastern, MD at [facility] OR   ? HERNIA REPAIR     ? HX APPENDECTOMY     ? PR LAPAROSCOPY SURG RPR INITIAL INGUINAL HERNIA      right      Social History     Tobacco Use   ? Smoking status: Current Every Day Smoker     Packs/day: 1.00     Years: 25.00     Pack years: 25.00     Types: Cigarettes     Last attempt to quit: 01/2017     Years since quitting: 3.5   ? Smokeless tobacco: Never Used   Substance Use Topics   ? Alcohol use: Not Currently     Alcohol/week: 0.0 standard drinks     Comment: Sober for 3 years      Family History   Problem Relation Age of Onset   ? Melanoma Father    ? Cancer Father         oral mandibular   ? High Cholesterol Mother    ? Cancer-Colon Maternal Aunt 70   ? Diabetes Neg Hx    ? Hypertension Neg Hx    ? Cancer-Breast Neg Hx    ? Cancer-Ovarian Neg Hx    ? Cancer-Prostate Neg Hx       Medications Prior to Admission   Medication  Sig Dispense Refill Last Dose   ? acetaminophen (TYLENOL) 325 mg tablet Take 325 mg by mouth every 6 hours as needed for Pain.   Past Week   ? ARIPiprazole (ABILIFY) 10 mg tablet TAKE 1 TABLET BY MOUTH DAILY 30 tablet 2 07/28/2020   ? atorvastatin (LIPITOR) 10 mg tablet TAKE 1 TABLET BY MOUTH DAILY 90 tablet 0 07/28/2020   ? doxycycline hyclate (VIBRACIN) 100 mg tablet Take one tablet by mouth twice daily. Indications: skin and skin structure infection 20 tablet 0 Past Month   ? ergocalciferol (vitamin D2) (VITAMIN D PO) Take  by mouth.   07/28/2020   ? nicotine (NICODERM CQ STEP 1) 21 mg/day patch Apply one patch to top of skin as directed daily. Rotate patch location.  Indications: stop smoking 28 patch 0 >1 Month   ? TRIUMEQ 600-50-300 mg tablet TAKE 1 TABLET BY MOUTH DAILY 30 tablet 3 07/28/2020   ? valACYclovir (VALTREX) 1 gram tablet TAKE 2 TABLETS BY MOUTH EVERY 12 HOURS FOR 4 DAYS. BEGIN AT ONSET OF COLD SORE   >1 Month   ? vitamins, multiple cap Take 1 capsule by mouth daily. >1 Month     Allergies   Allergen Reactions   ? Sulfa (Sulfonamide Antibiotics) SEE COMMENTS     Patient reports that his father and brother are allergic, he is unsure so he reports it as an allergy.        Review of Systems  A comprehensive review of systems was negative.    Physical Exam:  Vital Signs: Last Filed In 24 Hours Vital Signs: 24 Hour Range   BP: 116/78 (07/12 1430)  SpO2: 98 % (07/12 1430)  O2 Device: None (Room air) (07/12 1430)  SpO2 Pulse: 98 (07/12 1430) BP: (116)/(78)   SpO2:  [98 %]   O2 Device: None (Room air)          General appearance: Alert and no distress noted.  Neurologic: Grossly normal.  Lungs: Non labored at rest  Heart: Regular rate and rhythm  Abdomen: Non-distended    Pre-procedure anxiolysis plan: N/A  Sedation/Medication Plan: Local anesthetic  Personal history of sedation complications: Denies adverse event.   Family history of sedation complications: Denies adverse event.   Medications for Reversal: NA  Discussion/Reviews:  Physician has discussed risks and alternatives of this type of sedation and above planned procedures with patient    NPO Status: NA  Airway:  NA  Head and Neck: NA  Mouth: NA   Anesthesia Classification:  ASA III (A patient with a severe systemic disease that limits activity, but is not incapacitating)  Pregnancy Status: N/A    Lab/Radiology/Other Diagnostic Tests:  Labs:  Pertinent labs reviewed           Wesley Blas, APRN-NP  Pager [phone]

## 2020-07-29 NOTE — Other
Immediate Post Procedure Note    Date:  07/29/2020                                         Attending Physician:   Dr. Evelene Croon  Performing Provider:  Madalyn Rob, MD    Consent:  Consent obtained from patient.  Time out performed: Consent obtained, correct patient verified, correct procedure verified, correct site verified, patient marked as necessary.  Pre/Post Procedure Diagnosis:  Thyroid nodule. Submental nodule  Indications:  Thyroid nodule. Submental nodule      Procedure(s):  US guided biopsy  Findings:  Successful biopsy of inferior left thyroid nodule and left submental LN     Estimated Blood Loss:  None/Negligible  Specimen(s) Removed/Disposition:  Yes, sent to pathology and Cytology present  Complications: None  Patient Tolerated Procedure: Well  Post-Procedure Condition:  stable    Madalyn Rob, MD  Pager

## 2020-07-31 ENCOUNTER — Encounter: Admit: 2020-07-31 | Discharge: 2020-07-31 | Payer: No Typology Code available for payment source

## 2020-07-31 NOTE — Telephone Encounter
LOV 07/09/20    Pt calling as he received a letter from [place] stating [place], [place], will no longer be taking his insurance. Wants to know if this is true. Advised I will check with our office manager, Alyse Low.    Per Alyse Low, Glass blower/designer, pt can call a Chiropractor at [facility] at [phone] to see if his insurance is still accepted. Left the phone # on his VM.

## 2020-08-01 ENCOUNTER — Encounter: Admit: 2020-08-01 | Discharge: 2020-08-01 | Payer: No Typology Code available for payment source

## 2020-08-01 DIAGNOSIS — R590 Localized enlarged lymph nodes: Secondary | ICD-10-CM

## 2020-08-01 NOTE — Telephone Encounter
Referral to ENT Head and neck surgeons for excisional biopsy.    Future Appointments   Date Time Provider [place]   08/06/2020  9:00 AM Briant Cedar, MD [facility]   09/10/2020  3:30 PM Einar Crow I, MD [facility]   10/22/2020  4:30 PM Yetta Numbers, APRN-NP [facility]       The pt is a 50 year old male with recent IR biopsy to a left submental lymph node, which has revealed Atypical lymphoid proliferation, suspicious for lymphoma.  He also underwent a thyroid biopsy.        Final Diagnosis:   A. Thyroid, Left, US-guided FNA: Benign   Consistent with a benign follicular nodule (nodular goiter).     B. Lymph Node, Left submental, US-guided FNA:    Atypical lymphoid proliferation, suspicious for lymphoma. See comment.   Please also see concurrent flow cytometry report ([id number]).     Comment: B. Recommend excisional biopsy of the lymph node for definitive diagnosis.     Left message with pt on appt details on 7/15.  Will follow up with phone call next business day.

## 2020-08-06 ENCOUNTER — Ambulatory Visit: Admit: 2020-08-06 | Discharge: 2020-08-06 | Payer: No Typology Code available for payment source

## 2020-08-06 ENCOUNTER — Encounter: Admit: 2020-08-06 | Discharge: 2020-08-06 | Payer: No Typology Code available for payment source

## 2020-08-06 DIAGNOSIS — C8591 Non-Hodgkin lymphoma, unspecified, lymph nodes of head, face, and neck: Secondary | ICD-10-CM

## 2020-08-06 DIAGNOSIS — T1491XA Suicide attempt, initial encounter: Secondary | ICD-10-CM

## 2020-08-06 DIAGNOSIS — K5792 Diverticulitis of intestine, part unspecified, without perforation or abscess without bleeding: Secondary | ICD-10-CM

## 2020-08-06 DIAGNOSIS — B20 Human immunodeficiency virus [HIV] disease: Secondary | ICD-10-CM

## 2020-08-06 DIAGNOSIS — S129XXA Fracture of neck, unspecified, initial encounter: Secondary | ICD-10-CM

## 2020-08-06 DIAGNOSIS — K219 Gastro-esophageal reflux disease without esophagitis: Secondary | ICD-10-CM

## 2020-08-06 DIAGNOSIS — E785 Hyperlipidemia, unspecified: Secondary | ICD-10-CM

## 2020-08-06 DIAGNOSIS — R591 Generalized enlarged lymph nodes: Secondary | ICD-10-CM

## 2020-08-06 DIAGNOSIS — L409 Psoriasis, unspecified: Secondary | ICD-10-CM

## 2020-08-06 LAB — POC CREATININE, RAD: CREATININE, POC: 1 mg/dL (ref 0.4–1.24)

## 2020-08-06 MED ORDER — SODIUM CHLORIDE 0.9 % IJ SOLN
50 mL | Freq: Once | INTRAVENOUS | 0 refills | Status: CP
Start: 2020-08-06 — End: ?
  Administered 2020-08-06: 16:00:00 50 mL via INTRAVENOUS

## 2020-08-06 MED ORDER — IOHEXOL 350 MG IODINE/ML IV SOLN
70 mL | Freq: Once | INTRAVENOUS | 0 refills | Status: CP
Start: 2020-08-06 — End: ?
  Administered 2020-08-06: 16:00:00 70 mL via INTRAVENOUS

## 2020-08-07 ENCOUNTER — Encounter: Admit: 2020-08-07 | Discharge: 2020-08-07 | Payer: No Typology Code available for payment source

## 2020-08-07 NOTE — Telephone Encounter
Navigation Intake Assessment Document    Patient Name:  Charles Walls  DOB:  [birth year]  Insurance:   [employer]    Appointment Info:    Future Appointments   Date Time Provider [place]   08/27/2020 11:00 AM Remus Blake, MD [facility]  Exam   09/10/2020  3:30 PM Einar Crow I, MD [facility]   10/22/2020  4:30 PM Yetta Numbers, APRN-NP [facility]     Diagnosis & Reason for Visit:  Atypical lymphoid proliferation     Physician Info:   Referring Physician:  Erline Hau, [facility]     Location of Films:  IN HOUSE    Location of Pathology:  IN HOUSE    History of Present Illness:  Patient is a 50 year old male who had an FNA of a lymph node on 07/29/20 at [facility] which showed an atypical lymphoid proliferation. He then established care with [facility] who ordered a Neck CT on 08/06/20 which showed mildly prominent bilateral level 1, 2, and 3 lymph nodes, indeterminate between reactive etiology versus lymphomatous involvement. He is now being referred to hematology for further evaluation.     Allergies reviewed and verified with the patient, and documented in Epic:  Yes    Comments:  COVID-19 guidelines reviewed with patient, including: visitor and universal masking policies, and a temperature check at the facility entrance upon arrival.

## 2020-08-08 ENCOUNTER — Encounter: Admit: 2020-08-08 | Discharge: 2020-08-08 | Payer: No Typology Code available for payment source

## 2020-08-08 DIAGNOSIS — B20 Human immunodeficiency virus [HIV] disease: Secondary | ICD-10-CM

## 2020-08-08 MED ORDER — TRIUMEQ 600-50-300 MG PO TAB
ORAL_TABLET | Freq: Every day | 3 refills
Start: 2020-08-08 — End: ?

## 2020-08-08 NOTE — Telephone Encounter
Last Lab: [id number]  Last Viral Load: ND  Last STI testing: RPR 03/2019-NR;   CT/NG 01/2019 NEG  Last OV: 04/18/19  Next OV: 09/10/20

## 2020-08-09 ENCOUNTER — Encounter: Admit: 2020-08-09 | Discharge: 2020-08-09 | Payer: No Typology Code available for payment source

## 2020-08-11 ENCOUNTER — Ambulatory Visit: Admit: 2020-08-11 | Discharge: 2020-08-11 | Payer: No Typology Code available for payment source

## 2020-08-11 ENCOUNTER — Encounter: Admit: 2020-08-11 | Discharge: 2020-08-11 | Payer: No Typology Code available for payment source

## 2020-08-11 DIAGNOSIS — S129XXA Fracture of neck, unspecified, initial encounter: Secondary | ICD-10-CM

## 2020-08-11 DIAGNOSIS — K5792 Diverticulitis of intestine, part unspecified, without perforation or abscess without bleeding: Secondary | ICD-10-CM

## 2020-08-11 DIAGNOSIS — K219 Gastro-esophageal reflux disease without esophagitis: Secondary | ICD-10-CM

## 2020-08-11 DIAGNOSIS — T1491XA Suicide attempt, initial encounter: Secondary | ICD-10-CM

## 2020-08-11 DIAGNOSIS — B20 Human immunodeficiency virus [HIV] disease: Secondary | ICD-10-CM

## 2020-08-11 DIAGNOSIS — E785 Hyperlipidemia, unspecified: Secondary | ICD-10-CM

## 2020-08-11 DIAGNOSIS — L409 Psoriasis, unspecified: Secondary | ICD-10-CM

## 2020-08-11 MED ORDER — PROPOFOL INJ 10 MG/ML IV VIAL
INTRAVENOUS | 0 refills | Status: DC
Start: 2020-08-11 — End: 2020-08-11
  Administered 2020-08-11: 15:00:00 200 mg via INTRAVENOUS

## 2020-08-11 MED ORDER — FENTANYL CITRATE (PF) 50 MCG/ML IJ SOLN
INTRAVENOUS | 0 refills | Status: DC
Start: 2020-08-11 — End: 2020-08-11
  Administered 2020-08-11: 15:00:00 50 ug via INTRAVENOUS

## 2020-08-11 MED ORDER — ONDANSETRON HCL (PF) 4 MG/2 ML IJ SOLN
INTRAVENOUS | 0 refills | Status: DC
Start: 2020-08-11 — End: 2020-08-11
  Administered 2020-08-11: 15:00:00 4 mg via INTRAVENOUS

## 2020-08-11 MED ORDER — PHENYLEPHRINE HCL IN 0.9% NACL 1 MG/10 ML (100 MCG/ML) IV SYRG
INTRAVENOUS | 0 refills | Status: DC
Start: 2020-08-11 — End: 2020-08-11
  Administered 2020-08-11 (×2): 100 ug via INTRAVENOUS

## 2020-08-11 MED ORDER — PROPOFOL 10 MG/ML IV EMUL 10 ML (INFUSION)(AM)(OR)
INTRAVENOUS | 0 refills | Status: DC
Start: 2020-08-11 — End: 2020-08-11
  Administered 2020-08-11: 15:00:00 150 ug/kg/min via INTRAVENOUS

## 2020-08-11 MED ORDER — DEXAMETHASONE SODIUM PHOSPHATE 4 MG/ML IJ SOLN
INTRAVENOUS | 0 refills | Status: DC
Start: 2020-08-11 — End: 2020-08-11
  Administered 2020-08-11: 15:00:00 4 mg via INTRAVENOUS

## 2020-08-11 MED ORDER — SUCCINYLCHOLINE CHLORIDE 20 MG/ML IJ SOLN
INTRAVENOUS | 0 refills | Status: DC
Start: 2020-08-11 — End: 2020-08-11
  Administered 2020-08-11: 15:00:00 70 mg via INTRAVENOUS

## 2020-08-11 MED ORDER — LIDOCAINE (PF) 200 MG/10 ML (2 %) IJ SYRG
INTRAVENOUS | 0 refills | Status: DC
Start: 2020-08-11 — End: 2020-08-11
  Administered 2020-08-11: 15:00:00 100 mg via INTRAVENOUS

## 2020-08-11 MED ORDER — CEFAZOLIN 1 GRAM IJ SOLR
INTRAVENOUS | 0 refills | Status: DC
Start: 2020-08-11 — End: 2020-08-11
  Administered 2020-08-11: 15:00:00 2 g via INTRAVENOUS

## 2020-08-11 MED ORDER — ARTIFICIAL TEARS (PF) SINGLE DOSE DROPS GROUP
OPHTHALMIC | 0 refills | Status: DC
Start: 2020-08-11 — End: 2020-08-11
  Administered 2020-08-11: 15:00:00 2 [drp] via OPHTHALMIC

## 2020-08-11 MED ORDER — MIDAZOLAM 1 MG/ML IJ SOLN
INTRAVENOUS | 0 refills | Status: DC
Start: 2020-08-11 — End: 2020-08-11
  Administered 2020-08-11: 15:00:00 2 mg via INTRAVENOUS

## 2020-08-11 MED ADMIN — OXYCODONE 5 MG PO TAB [10814]: 5 mg | ORAL | @ 16:00:00 | Stop: 2020-08-11 | NDC 00904696661

## 2020-08-11 MED ADMIN — LIDOCAINE-EPINEPHRINE 1 %-1:100,000 IJ SOLN [15955]: 5 mL | INTRAMUSCULAR | @ 15:00:00 | Stop: 2020-08-11 | NDC 00409317817

## 2020-08-11 MED ADMIN — ACETAMINOPHEN 500 MG PO TAB [102]: 1000 mg | ORAL | @ 13:00:00 | Stop: 2020-08-11 | NDC 00904673061

## 2020-08-11 MED ADMIN — SODIUM CHLORIDE 0.9 % IV SOLP [27838]: 1000 mL | INTRAVENOUS | @ 13:00:00 | Stop: 2020-08-11 | NDC 00338004904

## 2020-08-11 MED ADMIN — SODIUM CHLORIDE 0.9 % IV SOLP [27838]: 1000.000 mL | INTRAVENOUS | @ 15:00:00 | Stop: 2020-08-11 | NDC 00264999900

## 2020-08-13 ENCOUNTER — Encounter: Admit: 2020-08-13 | Discharge: 2020-08-13 | Payer: No Typology Code available for payment source

## 2020-08-13 DIAGNOSIS — K219 Gastro-esophageal reflux disease without esophagitis: Secondary | ICD-10-CM

## 2020-08-13 DIAGNOSIS — B20 Human immunodeficiency virus [HIV] disease: Secondary | ICD-10-CM

## 2020-08-13 DIAGNOSIS — L409 Psoriasis, unspecified: Secondary | ICD-10-CM

## 2020-08-13 DIAGNOSIS — T1491XA Suicide attempt, initial encounter: Secondary | ICD-10-CM

## 2020-08-13 DIAGNOSIS — S129XXA Fracture of neck, unspecified, initial encounter: Secondary | ICD-10-CM

## 2020-08-13 DIAGNOSIS — K5792 Diverticulitis of intestine, part unspecified, without perforation or abscess without bleeding: Secondary | ICD-10-CM

## 2020-08-13 DIAGNOSIS — E785 Hyperlipidemia, unspecified: Secondary | ICD-10-CM

## 2020-08-20 ENCOUNTER — Encounter: Admit: 2020-08-20 | Discharge: 2020-08-20 | Payer: No Typology Code available for payment source

## 2020-08-20 MED ORDER — SILDENAFIL 50 MG PO TAB
50 mg | ORAL_TABLET | ORAL | 1 refills | 25.00000 days | Status: AC | PRN
Start: 2020-08-20 — End: ?

## 2020-08-20 NOTE — Telephone Encounter
Patient was calling to request script for Viagra that he said was discussed a few visits ago; patient states that Erline Hau wanted to review some of his labs first before writing script for him.

## 2020-08-21 ENCOUNTER — Encounter: Admit: 2020-08-21 | Discharge: 2020-08-21 | Payer: No Typology Code available for payment source

## 2020-08-21 NOTE — Telephone Encounter
Court Joy Key: [id number] - [place] Case ID: [id number] Need help? Call us at [phone]  Status  Sent to [employer]  Drug  Sildenafil Citrate '50MG'$  tablets  Form  [employer] HIM Electronic Prior Authorization Form ([employer]) 2017 NCPDP

## 2020-08-21 NOTE — Telephone Encounter
Court Joy Key: [id number] - PA Case ID: [id number] Need help? Call us at [phone]  Outcome  Approved today  Approved. This drug has been approved. Approved quantity: 10 units per 30 day(s). The drug has been approved from 08/07/2020 to 08/21/2021. Please call the pharmacy to process your prescription claim.  Drug  Sildenafil Citrate '50MG'$  tablets  Form  [employer] HIM Electronic Prior Authorization Form ([employer]) 2017 NCPDP

## 2020-08-26 ENCOUNTER — Encounter: Admit: 2020-08-26 | Discharge: 2020-08-26 | Payer: No Typology Code available for payment source

## 2020-08-26 DIAGNOSIS — R591 Generalized enlarged lymph nodes: Secondary | ICD-10-CM

## 2020-08-27 ENCOUNTER — Encounter: Admit: 2020-08-27 | Discharge: 2020-08-27 | Payer: No Typology Code available for payment source

## 2020-08-27 DIAGNOSIS — L409 Psoriasis, unspecified: Secondary | ICD-10-CM

## 2020-08-27 DIAGNOSIS — R591 Generalized enlarged lymph nodes: Secondary | ICD-10-CM

## 2020-08-27 DIAGNOSIS — S129XXA Fracture of neck, unspecified, initial encounter: Secondary | ICD-10-CM

## 2020-08-27 DIAGNOSIS — K5792 Diverticulitis of intestine, part unspecified, without perforation or abscess without bleeding: Secondary | ICD-10-CM

## 2020-08-27 DIAGNOSIS — K219 Gastro-esophageal reflux disease without esophagitis: Secondary | ICD-10-CM

## 2020-08-27 DIAGNOSIS — B20 Human immunodeficiency virus [HIV] disease: Secondary | ICD-10-CM

## 2020-08-27 DIAGNOSIS — T1491XA Suicide attempt, initial encounter: Secondary | ICD-10-CM

## 2020-08-27 DIAGNOSIS — E785 Hyperlipidemia, unspecified: Secondary | ICD-10-CM

## 2020-08-27 LAB — LEUKEMIA-LYMPHOMA PANEL BLOOD

## 2020-08-27 LAB — IMMUNOGLOBULINS-IGA,IGG,IGM
IGA: 176 mg/dL — ABNORMAL HIGH (ref 70–390)
IGG: 119 mg/dL (ref 762–1488)

## 2020-08-27 LAB — CBC AND DIFF
ABSOLUTE BASO COUNT: 0 K/UL (ref 0–0.20)
WBC COUNT: 4.9 K/UL (ref 4.5–11.0)

## 2020-08-27 LAB — BETA 2 MICROGLOBULIN: B2M: 2.7 mg/L — ABNORMAL HIGH (ref 0.8–2.3)

## 2020-08-27 LAB — URIC ACID: URIC ACID: 4.6 mg/dL (ref 4.0–8.0)

## 2020-08-27 LAB — HEPATITIS B SURFACE AG

## 2020-08-27 LAB — COMPREHENSIVE METABOLIC PANEL
ALT: 15 U/L (ref 7–56)
SODIUM: 140 MMOL/L — ABNORMAL LOW (ref 137–147)

## 2020-08-27 LAB — HEPATITIS B SURFACE AB: HEP B SURFACE ABY: NEGATIVE g/dL (ref 3.5–5.0)

## 2020-08-27 LAB — HEPATITIS C ANTIBODY W REFLEX HCV PCR QUANT

## 2020-08-27 LAB — HIV 1& 2 AG-AB SCRN W REFLEX HIV 1 PCR QUANT

## 2020-08-27 LAB — KAPPA/LAMBDA FREE LIGHT CHAINS: KAPPA FLC: 4.5 mg/dL — ABNORMAL HIGH (ref 0.33–1.94)

## 2020-08-27 LAB — PHOSPHORUS: PHOSPHORUS: 3.1 mg/dL (ref 2.0–4.5)

## 2020-08-27 LAB — LDH-LACTATE DEHYDROGENASE: LDH: 143 U/L (ref 100–210)

## 2020-08-27 LAB — HEPATITIS B CORE AB TOT (IGG+IGM)

## 2020-08-27 NOTE — Patient Instructions
Your care team:      Dr. Lelon Frohlich               Hematologist   Kavin Leech, APRN-NP               Hematology Nurse Practitioner   Clinical Nurse Coordinators (CNCs)               Nolon Nations, RN, BSN                    Mychart:   - We strongly recommend signing up for Mychart, our patient access portal, and sending [place] non-urgent questions/concerns through this portal. We will reply within one business day.   - This is also a great resource to view all of your labs and scan results.   - Please feel free to ask our scheduler upon checkout for help setting up your Mychart access.     Notify Clinic for the Following:  Fevers at or greater than 100.5 degrees (do not take Tylenol until you have spoken to a provider)  Persistent or uncontrolled vomiting  Persistent or uncontrolled diarrhea (more than 6 watery stools per day) or signs of blood in stools   Progressive cough with increased sputum (phlegm or mucus) production accompanied by increased shortness of breath  Recurrent nosebleeds or nosebleeds that lasts longer than 30 minutes.  Severe mouth sores/mouth pain  Severe constipation without a bowel movement in 4-5 days or significant straining.    For patients needing to establish care with a primary care provider within the [facility] network, please call [facility] Scheduling: [phone]    Phone numbers:      CNCs Dara: [phone]  Fax Number: [phone]    Please call Dr. Noralee Stain and Delton Coombes nurse line if you have non-urgent questions or concerns during business hours.   Messages left on nurses line are checked Monday through Friday between the hours of 8:00 AM and 3:30 PM. Messages left after 3:30 pm will be returned the following business day.     Evening (after 4:00 PM), weekends, and holiday on-call: [phone]   For urgent needs after hours, please ask for the oncologist on-call to be paged.   For urgent needs during business hours, please ask for Dara, Dr. Beau Fanny and Delton Coombes nurse coordinators, to be paged.     Notes:   - Allow 7-10 business days for our office to complete any requested paperwork (FMLA, etc.)   - Allow two to three business days for all medication refills. Please call your pharmacy first to check for available refills.

## 2020-08-27 NOTE — Progress Notes
Name: Charles Walls          MRN: [id number]      DOB: 1970/04/06      AGE: 50 y.o.   DATE OF SERVICE: 08/27/2020    Referring Provider: Dr.     Deno Walls:             Reason for Visit/HPI:    Patient is a 50 year old male who had an FNA of a lymph node on 07/29/20 at [facility] which showed an atypical lymphoid proliferation. He then established care with [name] who ordered a Neck CT on 08/06/20 which showed mildly prominent bilateral level 1, 2, and 3 lymph nodes, indeterminate between reactive etiology versus lymphomatous involvement. An excisional biopsy was done at [facility] on 08/11/20 which showed a reactive lymph node, but the flow cytometry showed atypical CD10+ B cells present. He is now being referred to hematology for further evaluation    He first noted a lump under his chin in 03/2020. Ultrasound on 05/13/20 showed a 1.2 cm lesion in the submental region as well as a highly suspicious 9 mm left thyroid lobe nodule. FNA of the thyroid nodule was consistent with a benign follicular nodule. FNA of the left submental lymph node showed an atypical lymphoid proliferation, suspicious for lymphoma.     Subsequent excisional biopsy of the submental lymph node was performed. Concurrent flow cytometry identified atypical CD10+ B-cells, negative for K/L light changes. 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     On exam, he reports feeling well. He denies any fevers. He does have intermittent chills. He is compliant with his HIV medication and his last level was undetectable. 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 His in no longer able to access his veins. 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 He does have intermittent swelling of his b/l groins. He denies any other lymphadenopathy.     Past Medical History:       Diverticulitis  Fracture cervical vertebra-closed (HCC)      Comment:  c6, c7 - no surgical repair  Gastroesophageal reflux disease without esophagitis      Comment:  Ordered omeprazole 40mg  qday before breakfast.                 Discussed lifestyle modifications. No current red flag                symptoms.  Hx of PUD in high school  Human immunodeficiency virus (HIV) disease (HCC)  Hyperlipidemia  Psoriasis, unspecified  Suicide attempt [facility])  Past Surgical History:  Surgical History:   Procedure Laterality Date   ? COLONOSCOPY N/A 06/17/2015    Performed by Jolee Ewing, MD at [facility] ENDO   ? ROBOT ASSISTED LAPAROSCOPIC NEPHRECTOMY PARTIAL WITH INTRAOPERATIVE ULTRASOUND  Left 06/09/2017    Performed by Daryl Eastern, MD at [facility] OR   ? BIOPSY/ EXCISION LYMPH NODE DEEP CERVICAL Left 08/11/2020    Performed by Alpha Gula, MD at [facility] OR   ? HERNIA REPAIR     ? HX APPENDECTOMY     ? PR LAPAROSCOPY SURG RPR INITIAL INGUINAL HERNIA      right     Allergies:  Allergies   Allergen Reactions   ? Sulfa (Sulfonamide Antibiotics) SEE COMMENTS     Patient reports that his father and brother are allergic, he is unsure so he reports it as an allergy.  Medications:    Current Outpatient Medications:   ?  acetaminophen (TYLENOL) 325 mg tablet, Take 325 mg by mouth every 6 hours as needed for Pain., Disp: , Rfl:   ?  ARIPiprazole (ABILIFY) 10 mg tablet, TAKE 1 TABLET BY MOUTH DAILY, Disp: 30 tablet, Rfl: 2  ?  atorvastatin (LIPITOR) 10 mg tablet, TAKE 1 TABLET BY MOUTH DAILY, Disp: 90 tablet, Rfl: 0  ?  doxycycline hyclate (VIBRACIN) 100 mg tablet, Take one tablet by mouth twice daily. Indications: skin and skin structure infection, Disp: 20 tablet, Rfl: 0  ?  ergocalciferol (vitamin D2) (VITAMIN D PO), Take  by mouth., Disp: , Rfl:   ?  nicotine (NICODERM CQ STEP 1) 21 mg/day patch, Apply one patch to top of skin as directed daily. Rotate patch location.  Indications: stop smoking, Disp: 28 patch, Rfl: 0  ?  sildenafiL (VIAGRA) 50 mg tablet, Take one tablet by mouth as Needed for Erectile dysfunction., Disp: 10 tablet, Rfl: 1  ?  TRIUMEQ 600-50-300 mg tablet, Take 1 Tablet by mouth once daily, Disp: 30 tablet, Rfl: 3  ?  valACYclovir (VALTREX) 1 gram tablet, TAKE 2 TABLETS BY MOUTH EVERY 12 HOURS FOR 4 DAYS. BEGIN AT ONSET OF COLD SORE, Disp: , Rfl:   ?  vitamins, multiple cap, Take 1 capsule by mouth daily., Disp: , Rfl:     Family History:  Family History   Problem Relation Age of Onset   ? Melanoma Father    ? Cancer Father         oral mandibular   ? High Cholesterol Mother    ? Cancer-Colon Maternal Aunt 70   ? Diabetes Neg Hx    ? Hypertension Neg Hx    ? Cancer-Breast Neg Hx    ? Cancer-Ovarian Neg Hx    ? Cancer-Prostate Neg Hx      Patient Social History:   Social History     Socioeconomic History   ? Marital status: Divorced   Occupational History   ? Occupation: unemployed   Tobacco Use   ? Smoking status: Current Every Day Smoker     Packs/day: 1.00     Years: 25.00     Pack years: 25.00     Types: Cigarettes     Last attempt to quit: 01/2017     Years since quitting: 3.6   ? Smokeless tobacco: Never Used   Vaping Use   ? Vaping Use: Former   Substance and Sexual Activity   ? 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 Drug use: Yes     Types: Methamphetamines, IV     Comment: IV crystal meth. Last use was within the past 24 hours.   ? Sexual activity: Yes     Partners: Male   Social History Narrative    Pagan       Review of Systems   Constitutional: Negative for activity change, appetite change, chills, diaphoresis, fatigue, fever and unexpected weight change.   HENT: Negative for sneezing and sore throat.    Respiratory: Negative for chest tightness.    Cardiovascular: Negative for chest pain.   Gastrointestinal: Negative for abdominal distention.   Genitourinary: Negative for enuresis, flank pain and frequency.   Skin: Negative for pallor and rash.   Neurological: Negative for seizures, light-headedness and numbness.   Hematological: Negative for adenopathy. Does not bruise/bleed easily.  Psychiatric/Behavioral: Negative for confusion, sleep disturbance and suicidal ideas.       Objective:         ? acetaminophen (TYLENOL) 325 mg tablet Take 325 mg by mouth every 6 hours as needed for Pain.   ? ARIPiprazole (ABILIFY) 10 mg tablet TAKE 1 TABLET BY MOUTH DAILY   ? atorvastatin (LIPITOR) 10 mg tablet TAKE 1 TABLET BY MOUTH DAILY   ? doxycycline hyclate (VIBRACIN) 100 mg tablet Take one tablet by mouth twice daily. Indications: skin and skin structure infection   ? ergocalciferol (vitamin D2) (VITAMIN D PO) Take  by mouth.   ? nicotine (NICODERM CQ STEP 1) 21 mg/day patch Apply one patch to top of skin as directed daily. Rotate patch location.  Indications: stop smoking   ? sildenafiL (VIAGRA) 50 mg tablet Take one tablet by mouth as Needed for Erectile dysfunction.   ? TRIUMEQ 600-50-300 mg tablet Take 1 Tablet by mouth once daily   ? valACYclovir (VALTREX) 1 gram tablet TAKE 2 TABLETS BY MOUTH EVERY 12 HOURS FOR 4 DAYS. BEGIN AT ONSET OF COLD SORE   ? vitamins, multiple cap Take 1 capsule by mouth daily.     There were no vitals filed for this visit.  There is no height or weight on file to calculate BMI.                     Physical Exam  Constitutional:       General: He is not in acute distress.     Appearance: Normal appearance. He is normal weight. He is not ill-appearing, toxic-appearing or diaphoretic.   HENT:      Mouth/Throat:      Mouth: Mucous membranes are moist.   Eyes:      Conjunctiva/sclera: Conjunctivae normal.   Neck:      Comments: Healing submandibular scar from recent excisional biopsy  Cardiovascular:      Pulses: Normal pulses.      Heart sounds: Normal heart sounds. No murmur heard.  Pulmonary:      Effort: Pulmonary effort is normal.      Breath sounds: Normal breath sounds.   Abdominal:      General: There is no distension.      Palpations: There is no mass.      Tenderness: There is no abdominal tenderness. There is no guarding. Musculoskeletal:         General: No swelling.      Comments: Scattered nodules/varicosities, consistent with prior injection sites. No erythema or evidence of infection   Lymphadenopathy:      Head:      Right side of head: No submental, submandibular, tonsillar, preauricular, posterior auricular or occipital adenopathy.      Left side of head: No submental, submandibular, tonsillar, preauricular, posterior auricular or occipital adenopathy.      Cervical: No cervical adenopathy.      Right cervical: No superficial, deep or posterior cervical adenopathy.     Left cervical: No superficial, deep or posterior cervical adenopathy.      Upper Body:      Right upper body: No supraclavicular, axillary or epitrochlear adenopathy.      Left upper body: No supraclavicular, axillary, pectoral or epitrochlear adenopathy.      Lower Body: No right inguinal adenopathy. No left inguinal adenopathy.   Skin:     General: Skin is warm and dry.   Neurological:      Mental Status: He is  alert and oriented to person, place, and time.   Psychiatric:         Mood and Affect: Mood normal.         Behavior: Behavior normal.              The imaging findings as well as laboratory results have been reviewed.      Assessment and Plan:    Jadis Plate is a 50 y.o. male with a PMH of well-controlled HIV, IV methamphetamine use c/b recurrent cellulitis, who presents for further management of lymphadenopathy.      #Reactive Lymphoid Hyperplasia:    We discussed with Mr. Woulard that excisional biopsy of his submental lymph node is negative for malignancy, and is consistent with a reactive process. We discussed that the intermittent swelling of his inguinal lymph nodes may also be due to a reactive process, potentially related to his injections in his lower extremities.      Plan  -we will complete imaging, with CT CAP to assess for lymphadenopathy  -repeat labs, with flow cytometry (peripheral blood)  -he does have difficult IV access, which may make contrast use difficult-ok to obtain non-contrast imaging  -we discussed the risks of continued IV drug use, including infection. He states he has resources to help him quit.   -will f/u imaging/labs and RTC with results      -pt seen and discussed with Dr. Joya Gaskins, DO, PGY6  Hematology Oncology Fellow    I have personally performed a history and physical exam on the patient on August 27, 2020. I have discussed the case with the fellow and concur with her documentation of history, physical exam, assessment, and treatment plan unless otherwise noted.    Jazlene Bares

## 2020-09-12 ENCOUNTER — Encounter: Admit: 2020-09-12 | Discharge: 2020-09-12 | Payer: No Typology Code available for payment source

## 2020-09-12 ENCOUNTER — Ambulatory Visit: Admit: 2020-09-12 | Discharge: 2020-09-12 | Payer: No Typology Code available for payment source

## 2020-09-12 DIAGNOSIS — Z113 Encounter for screening for infections with a predominantly sexual mode of transmission: Secondary | ICD-10-CM

## 2020-09-12 DIAGNOSIS — B20 Human immunodeficiency virus [HIV] disease: Secondary | ICD-10-CM

## 2020-09-12 DIAGNOSIS — T1491XA Suicide attempt, initial encounter: Secondary | ICD-10-CM

## 2020-09-12 DIAGNOSIS — K219 Gastro-esophageal reflux disease without esophagitis: Secondary | ICD-10-CM

## 2020-09-12 DIAGNOSIS — E785 Hyperlipidemia, unspecified: Secondary | ICD-10-CM

## 2020-09-12 DIAGNOSIS — S129XXA Fracture of neck, unspecified, initial encounter: Secondary | ICD-10-CM

## 2020-09-12 DIAGNOSIS — K5792 Diverticulitis of intestine, part unspecified, without perforation or abscess without bleeding: Secondary | ICD-10-CM

## 2020-09-12 DIAGNOSIS — L409 Psoriasis, unspecified: Secondary | ICD-10-CM

## 2020-09-12 DIAGNOSIS — N341 Nonspecific urethritis: Principal | ICD-10-CM

## 2020-09-12 LAB — CHLAM/NG PCR URINE
CHLAMYDIA TRACH PCR: NEGATIVE
NEISSERIA GONORROEAE PCR: POSITIVE — AB

## 2020-09-12 MED ORDER — DOXYCYCLINE HYCLATE 100 MG PO TAB
100 mg | ORAL_TABLET | Freq: Two times a day (BID) | ORAL | 0 refills | 8.00000 days | Status: AC
Start: 2020-09-12 — End: ?

## 2020-09-12 NOTE — Progress Notes
Date of Service: 09/12/2020    Charles Walls is a 50 y.o. male.  DOB: 1970/03/14  MRN: [id number]     Subjective:             History of Present Illness  50 year old with urethral discharge mucoid in color he states.  He has had STDs before he has 4 partners who is already talked to he states.  No upper tract signs are typical of UTI although urine was collected initially showing leukocytes this is a urethritis.  Some burning but no frequency urgency  No high fever back pain abdominal pain     Review of Systems  Problem list reviewed.  He has been on doxycycline without incident previous.  Recent visits reviewed.  Negative benign biopsy lymph node.  Objective:         ? acetaminophen (TYLENOL) 325 mg tablet Take 325 mg by mouth every 6 hours as needed for Pain.   ? ARIPiprazole (ABILIFY) 10 mg tablet TAKE 1 TABLET BY MOUTH DAILY   ? atorvastatin (LIPITOR) 10 mg tablet TAKE 1 TABLET BY MOUTH DAILY   ? doxycycline hyclate (VIBRACIN) 100 mg tablet Take one tablet by mouth twice daily for 5 days. Indications: skin and skin structure infection   ? ergocalciferol (vitamin D2) (VITAMIN D PO) Take  by mouth.   ? nicotine (NICODERM CQ STEP 1) 21 mg/day patch Apply one patch to top of skin as directed daily. Rotate patch location.  Indications: stop smoking   ? sildenafiL (VIAGRA) 50 mg tablet Take one tablet by mouth as Needed for Erectile dysfunction.   ? TRIUMEQ 600-50-300 mg tablet Take 1 Tablet by mouth once daily   ? valACYclovir (VALTREX) 1 gram tablet TAKE 2 TABLETS BY MOUTH EVERY 12 HOURS FOR 4 DAYS. BEGIN AT ONSET OF COLD SORE   ? vitamins, multiple cap Take 1 capsule by mouth daily.     Vitals:    09/12/20 1648   BP: 117/71   Pulse: 107   Temp: 36.8 ?C (98.2 ?F)   SpO2: 96%   PainSc: Two   Weight: 68 kg (150 lb)   Height: 163.2 cm (5' 4.25)     Body mass index is 25.55 kg/m?Marland Kitchen     Physical Exam  Constitutional:       Appearance: He is not ill-appearing or toxic-appearing.   Cardiovascular:      Rate and Rhythm: Regular rhythm.   Pulmonary:      Effort: No respiratory distress.   Abdominal:      General: There is no distension.      Tenderness: There is no abdominal tenderness.   Skin:     Coloration: Skin is not jaundiced or pale.   Neurological:      Mental Status: He is alert and oriented to person, place, and time.   Psychiatric:         Behavior: Behavior normal.       In no acute distress.  Penis circumcised.  Mild erythema at the urethra but no obvious discharge or bleeding or skin lesions.  Good color afebrile vitals normal appropriate affect good insight     Assessment and Plan:  [name] was seen today for exposure to std.    Diagnoses and all orders for this visit:    Urethritis, nonspecific  -     CHLAM/NG PCR URINE; Future; Expected date: 09/12/2020    Routine screening for STI (sexually transmitted infection)  -  CHLAM/NG PCR URINE    Other orders  -     doxycycline hyclate (VIBRACIN) 100 mg tablet; Take one tablet by mouth twice daily for 5 days. Indications: skin and skin structure infection    Doxycycline.  This is likely chlamydia iDisks discussed with him waiting for the urine GC chlamydia before prescribing Rocephin at this point.  He understands [place] on MyChart and followed up.  Watch out for worsening symptoms fever pain or not quickly improving on antibiotic.  Dismissed in no distress

## 2020-09-12 NOTE — Telephone Encounter
Charles Walls calls states that he is having penile discharge and pain with urination. Explained we cannot get him in today. He needs to go to urgent care for STI testing. Gave location. He voiced understanding and will go to urgent care for testing and treatment.

## 2020-09-19 ENCOUNTER — Ambulatory Visit: Admit: 2020-09-19 | Discharge: 2020-09-19 | Payer: No Typology Code available for payment source

## 2020-09-19 ENCOUNTER — Encounter: Admit: 2020-09-19 | Discharge: 2020-09-19 | Payer: No Typology Code available for payment source

## 2020-09-19 DIAGNOSIS — Z23 Encounter for immunization: Secondary | ICD-10-CM

## 2020-09-19 DIAGNOSIS — Z113 Encounter for screening for infections with a predominantly sexual mode of transmission: Secondary | ICD-10-CM

## 2020-09-19 DIAGNOSIS — B20 Human immunodeficiency virus [HIV] disease: Secondary | ICD-10-CM

## 2020-09-19 LAB — CD4 ABSOLUTE CELL COUNT
CD4 COUNT: 918 (ref 440–2160)
CD4%: 41 % (ref 28–63)

## 2020-09-19 LAB — CBC AND DIFF
HEMOGLOBIN: 12 g/dL — ABNORMAL LOW (ref 13.5–16.5)
RBC COUNT: 4 M/UL — ABNORMAL LOW (ref 4.4–5.5)
WBC COUNT: 5.9 K/UL (ref 4.5–11.0)

## 2020-09-19 LAB — CHLAM/NG PCR SWAB
CHLAMYDIA PROBE: NEGATIVE
GONORRHEA PROBE: POSITIVE — AB

## 2020-09-19 LAB — COMPREHENSIVE METABOLIC PANEL
POTASSIUM: 4.4 MMOL/L (ref 3.5–5.1)
SODIUM: 139 MMOL/L (ref 137–147)

## 2020-09-19 LAB — HIV VIRAL LOAD PCR QUANT

## 2020-09-19 MED ORDER — ARIPIPRAZOLE 10 MG PO TAB
10 mg | ORAL_TABLET | Freq: Every day | ORAL | 1 refills | Status: AC
Start: 2020-09-19 — End: ?

## 2020-09-19 MED ORDER — CEFTRIAXONE/LIDOCAINE IM 500MG VIAL MIXTURE
500 mg | Freq: Once | INTRAMUSCULAR | 0 refills | Status: CP
Start: 2020-09-19 — End: ?
  Administered 2020-09-19 (×2): 500 mg via INTRAMUSCULAR

## 2020-09-19 MED ORDER — AZITHROMYCIN 500 MG PO TAB
1000 mg | Freq: Once | ORAL | 0 refills | Status: CP
Start: 2020-09-19 — End: ?
  Administered 2020-09-19: 21:00:00 1000 mg via ORAL

## 2020-09-24 NOTE — Progress Notes
Date of Service: 09/25/2020    Subjective:      Charles Walls is a 50 y.o. male  [id number]    History of Present Illness   Charles Walls is a 50 y.o. male  w/ a pmh significant for Fracture cervical vertebra-closed (HCC), Human immunodeficiency virus (HIV) disease (HCC), Hyperlipidemia (06/13/2013), Psoriasis, unspecified (02/21/2014), and Suicide attempt (HCC). who was last seen 917/2021 by Dr. 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  Medical issues HIV.  Medications included: Abilify increased to 10mg  qday, LAI discussed and patient was amenable as long as his metabolic labs did not worsen in interim, also discussed transitioning to lamictal at that time if metabolic labs did worsen. At time of last appointment, patient had been experiencing psychosocial stress from new job, was recovering from interpersonal split from prior ex-boyfriend, was otherwise doing well and reporting even keeled mood on abilify.  Changes that visit: continued abilify at 10mg  qday, discussed checking metabolic labs and switching to lamictal if they worsened, which have since improved.     Seen and evaluated today for f/u. Since last visit, says things have been going well. Recently had a cancer scare but the lymph node biopsy came back benign.     Sleep- denies concerns, has been getting 8 hours  Appetite- denies concerns  Energy- denies concerns  Anxiety- denies any right now  Mood- melancholy, says that he feels blah  Medical concerns: HIV undetectable, lymph node biopsy benign, hospitalized twice for sepsis this year   Medication concerns/requests: denies concerns       Says that he was in the beginning of a divorce when he walked in on his husband cheating on him in their own bed. Spiraled from there for six months. Has no idea why he was diagnosed with bipolar 2.  Says that he was experiencing increased depression and would fall apart into tears. Says for the first six months that he had moved into his new house by himself, he had been looking for spots to hang himself. Says that he was not sleeping well during this time. Says he was up and keeping his mind busy during this time. Says he was using crystal methamphetamines during that time and this was contributing to sleeplessness. Eventually, was able to function and sleep while on crystal meth. Ongoing amphetamine use every month at least one to two times since then.     Past psychiatric history: history of paxil, says that he cannot remember how he tolerated this, says he cannot recall if it made a difference + trazodone for insomnia, caused him to have a headache; denies psych hospitalizations or suicide attempts    Social History Update:  Lives in house. Says he is currently single.  Employed?  Works in a [employer]. Says that he cannot do inpatient rehabilitation because of this medication, says he is intersted in IOP and NA at this time.   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 Last used a couple weeks ago. Says he is interested in cessation of amphetamines. Says that the amphetamines treats his triggered depression.     Review of Systems   Respiratory: Negative for chest tightness and shortness of breath.    Cardiovascular: Negative for chest pain and palpitations.   Gastrointestinal: Negative for nausea and vomiting.   Neurological:  Negative for seizures.   Psychiatric/Behavioral: Negative for dysphoric mood, sleep disturbance and suicidal ideas. The patient is not nervous/anxious.        Objective:         ? acetaminophen (TYLENOL) 325 mg tablet Take 325 mg by mouth every 6 hours as needed for Pain.   ? ARIPiprazole (ABILIFY) 10 mg tablet Take one tablet by mouth daily.   ? atorvastatin (LIPITOR) 10 mg tablet TAKE 1 TABLET BY MOUTH DAILY   ? nicotine (NICODERM CQ STEP 1) 21 mg/day patch Apply one patch to top of skin as directed daily. Rotate patch location.  Indications: stop smoking   ? sildenafiL (VIAGRA) 50 mg tablet Take one tablet by mouth as Needed for Erectile dysfunction.   ? TRIUMEQ 600-50-300 mg tablet Take 1 Tablet by mouth once daily   ? valACYclovir (VALTREX) 1 gram tablet TAKE 2 TABLETS BY MOUTH EVERY 12 HOURS FOR 4 DAYS. BEGIN AT ONSET OF COLD SORE   ? vitamins, multiple cap Take 1 capsule by mouth daily.       Vitals:    09/25/20 1009   BP: 114/73   Pulse: 92   PainSc: Zero   Weight: 70.3 kg (155 lb)   Height: 162.6 cm (5' 4)     Body mass index is 26.61 kg/m?Marland Kitchen     Metabolic monitoring:  Metabolic monitoring:     Body mass index is 26.61 kg/m?Marland Kitchen  Wt Readings from Last 3 Encounters:   09/25/20 70.3 kg (155 lb)   09/19/20 68.5 kg (151 lb)   09/12/20 68 kg (150 lb)     BP Readings from Last 3 Encounters:   09/25/20 114/73   09/19/20 111/70   09/12/20 117/71     Lab Results   Component Value Date    CHOL 141 07/03/2020    TRIG 84 07/03/2020    HDL 30 (L) 07/03/2020    LDL 100 (H) 07/03/2020    VLDL 17 07/03/2020    NONHDLCHOL 111 07/03/2020     Hemoglobin A1C   Date Value Ref Range Status   07/03/2020 5.4 4.0 - 6.0 % Final     Comment:     The ADA recommends that most patients with type 1 and type 2 diabetes maintain   an A1c level <7%.         AIMS  No data recorded      Physical Exam    Mental Status Exam:  ? General/Constitutional: appears stated age, personal attire, no acute distress  ? Eye Contact: fair  ? Behavior: calm and cooperative  ? Speech: RRR with normal tone/vol  ? Mood: numbness  ? Affect: mood congruent, full range of affect  ? Thought Process: linear and goal directed  ? Thought Content: denies SI/HI, no evidence of paranoia/delusions  ? Perception: denies AVH, no evidence of internal stimuli  ? Associations: intact  ? Insight: fair  ? Judgement: fair    ? Orientation: A&Ox4  ? Recent and remote memory: grossly intact per conversation  ? Attention span and concentration: grossly intact for conversation  ? Cognition: intact  ? Language: English  ? Fund of knowledge/vocabulary: average                 Assessment and Plan:     Summary/Formulation:   ZAIVION GELINAS is a 50 y.o. male who presents to [facility] outpatient psychiatry for f/u.  No safety concerns today. Discussed less likelihood of bipolar 2 and more likely  substance induced bipolar disorder at this time. Will try abilify 5mg  qday and if this does not result in worsened hypomania in context of amphetamine sobriety, consider antidepressant instead of abilify at future appointment. Patient amenable.     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 Says this metabolic labs reviewed and indicative of improvement in HDL and LDL over last six years; hemoglobin a1c wnl.   -Consider antidepressant at future appointment upon discontinuing abilify.   -Patient previously trialed wellbutrin for amphetamine cravings and this was ineffective.   -Recommend psychotherapy for psychosocial stressors and history of emotional trauma/ SUD.     RTC: 3 months    Discussion  1. The proposed treatment plan was discussed with the patient who was provided the opportunity to actively take part finalizing the current treatment plan.   ? Discussed risks, benefits and potential side effects of medications with patient and guardian as well as alternative treatments  ? Discussed relevant black box warnings   ? patient reported understanding of the risks, benefits and possible side effects and provided consent for treatment unless otherwise stated  2. Discussed contingency/emergency plans with the patient should there be concern for attempting suicide, committing self-harm or other potential injuries to self or others  1. These plans include:  ? Contacting the mental health clinic (calling, walk-in, etc.)  ? Calling 911 or calling the crisis line  ? Visiting the ER at any time    Patient seen and discussed with Dr. Herminio Commons, MD

## 2020-09-25 ENCOUNTER — Ambulatory Visit: Admit: 2020-09-25 | Discharge: 2020-09-25 | Payer: No Typology Code available for payment source

## 2020-09-25 ENCOUNTER — Encounter: Admit: 2020-09-25 | Discharge: 2020-09-25 | Payer: No Typology Code available for payment source

## 2020-09-25 DIAGNOSIS — K5792 Diverticulitis of intestine, part unspecified, without perforation or abscess without bleeding: Secondary | ICD-10-CM

## 2020-09-25 DIAGNOSIS — F1994 Other psychoactive substance use, unspecified with psychoactive substance-induced mood disorder: Secondary | ICD-10-CM

## 2020-09-25 DIAGNOSIS — E785 Hyperlipidemia, unspecified: Secondary | ICD-10-CM

## 2020-09-25 DIAGNOSIS — T1491XA Suicide attempt, initial encounter: Secondary | ICD-10-CM

## 2020-09-25 DIAGNOSIS — F1510 Other stimulant abuse, uncomplicated: Secondary | ICD-10-CM

## 2020-09-25 DIAGNOSIS — S129XXA Fracture of neck, unspecified, initial encounter: Secondary | ICD-10-CM

## 2020-09-25 DIAGNOSIS — B20 Human immunodeficiency virus [HIV] disease: Secondary | ICD-10-CM

## 2020-09-25 DIAGNOSIS — L409 Psoriasis, unspecified: Secondary | ICD-10-CM

## 2020-09-25 DIAGNOSIS — K219 Gastro-esophageal reflux disease without esophagitis: Secondary | ICD-10-CM

## 2020-09-25 MED ORDER — ARIPIPRAZOLE 10 MG PO TAB
5 mg | ORAL_TABLET | Freq: Every day | ORAL | 0 refills | Status: AC
Start: 2020-09-25 — End: ?

## 2020-09-25 NOTE — Progress Notes
I have discussed this patient's care with the resident and concur with content written but did not personally see and exam the patient today.     Gustav Knueppel L Everlie Eble, DO

## 2020-09-25 NOTE — Patient Instructions
Return to clinic in 3 months.  Please call the clinic to reschedule or cancel appointments if somethings changes.     > decreased abilify to '5mg'$  a day.  > consider antidepressant at future appointment.  > Consider NA, can check out "psychologytoday.com" for a therapist who can help you discuss your substance use.     If you need a medication refill, please call your pharmacy to request refills.     In the event of a safety concern or suicidal thoughts, call 911 or go to the nearest emergency room. [facility] ([phone] (Talk). Crisis Text Hotline (text 847-816-0917).     The [facility] phone line is a resource for talk support in non-emergency situations (1-866-WARMEAR).    Please do not hesitate to call the clinic, if you have questions regarding your psychiatric medications or start to develop side effects to medications prescribed by your psychiatrist.

## 2020-10-14 ENCOUNTER — Encounter: Admit: 2020-10-14 | Discharge: 2020-10-14 | Payer: No Typology Code available for payment source

## 2020-10-14 DIAGNOSIS — R591 Generalized enlarged lymph nodes: Secondary | ICD-10-CM

## 2020-10-15 ENCOUNTER — Encounter: Admit: 2020-10-15 | Discharge: 2020-10-15 | Payer: No Typology Code available for payment source

## 2020-10-15 ENCOUNTER — Ambulatory Visit: Admit: 2020-10-15 | Discharge: 2020-10-15 | Payer: No Typology Code available for payment source

## 2020-10-15 DIAGNOSIS — R591 Generalized enlarged lymph nodes: Secondary | ICD-10-CM

## 2020-10-15 LAB — CBC AND DIFF
HEMATOCRIT: 41 % (ref 40–50)
HEMOGLOBIN: 14 g/dL (ref 13.5–16.5)
MCH: 31 pg (ref 26–34)
MCHC: 33 g/dL (ref 32.0–36.0)
MCV: 94 FL (ref 80–100)
MPV: 6.9 FL — ABNORMAL LOW (ref 7–11)
PLATELET COUNT: 512 K/UL — ABNORMAL HIGH (ref 150–400)
RBC COUNT: 4.4 M/UL — ABNORMAL HIGH (ref 4.4–5.5)
RDW: 15 % — ABNORMAL HIGH (ref 11–15)
WBC COUNT: 6.7 K/UL (ref 4.5–11.0)

## 2020-10-15 LAB — COMPREHENSIVE METABOLIC PANEL
POTASSIUM: 4.6 MMOL/L (ref 3.5–5.1)
SODIUM: 140 MMOL/L (ref 137–147)

## 2020-10-15 LAB — LDH-LACTATE DEHYDROGENASE: LDH: 173 U/L — ABNORMAL HIGH (ref 100–210)

## 2020-10-15 LAB — KAPPA/LAMBDA FREE LIGHT CHAINS
KAPPA FLC: 3.7 mg/dL — ABNORMAL HIGH (ref 0.33–1.94)
LAMBDA FLC: 2.1 mg/dL (ref 0.57–2.63)

## 2020-10-15 MED ORDER — SODIUM CHLORIDE 0.9 % IJ SOLN
50 mL | Freq: Once | INTRAVENOUS | 0 refills | Status: CP
Start: 2020-10-15 — End: ?
  Administered 2020-10-15: 18:00:00 50 mL via INTRAVENOUS

## 2020-10-15 MED ORDER — IOHEXOL 350 MG IODINE/ML IV SOLN
100 mL | Freq: Once | INTRAVENOUS | 0 refills | Status: CP
Start: 2020-10-15 — End: ?
  Administered 2020-10-15: 18:00:00 100 mL via INTRAVENOUS

## 2020-10-15 NOTE — Patient Instructions
Your care team:      Dr. Lelon Frohlich               Hematologist   Kavin Leech, APRN-NP               Hematology Nurse Practitioner   Clinical Nurse Coordinators (CNCs)               Nolon Nations, RN, BSN,  Boone Master RN MSN                    Mychart:   - We strongly recommend signing up for Mychart, our patient access portal, and sending [place] non-urgent questions/concerns through this portal. We will reply within one business day.   - This is also a great resource to view all of your labs and scan results.   - Please feel free to ask our scheduler upon checkout for help setting up your Mychart access.     Notify Clinic for the Following:  Fevers at or greater than 100.5 degrees (do not take Tylenol until you have spoken to a provider)  Persistent or uncontrolled vomiting  Persistent or uncontrolled diarrhea (more than 6 watery stools per day) or signs of blood in stools   Progressive cough with increased sputum (phlegm or mucus) production accompanied by increased shortness of breath  Recurrent nosebleeds or nosebleeds that lasts longer than 30 minutes.  Severe mouth sores/mouth pain  Severe constipation without a bowel movement in 4-5 days or significant straining.    For patients needing to establish care with a primary care provider within the [facility] network, please call [facility] Scheduling: [phone]    Phone numbers:      CNCs Loistine Simas and Dara: [phone]          Fax Number: [phone]    Please call Dr. Noralee Stain and Delton Coombes nurse line if you have non-urgent questions or concerns during business hours.   Messages left on nurses line are checked Monday through Friday between the hours of 8:00 AM and 3:30 PM. Messages left after 3:30 pm will be returned the following business day.     Evening (after 4:00 PM), weekends, and holiday on-call: [phone]   For urgent needs after hours, please ask for the oncologist on-call to be paged.   For urgent needs during business hours, please ask for Dara, Dr. Beau Fanny and Delton Coombes nurse coordinators, to be paged.     Notes:   - Allow 7-10 business days for our office to complete any requested paperwork (FMLA, etc.)   - Allow two to three business days for all medication refills. Please call your pharmacy first to check for available refills.

## 2020-10-16 ENCOUNTER — Encounter: Admit: 2020-10-16 | Discharge: 2020-10-16 | Payer: No Typology Code available for payment source

## 2020-10-16 NOTE — Telephone Encounter
Telephone appointment completed with Dr. Athena Masse after review of labs and imaging.  Recommendation made to patient and message sent to PCP for annual lab work and monitoring for symptoms.

## 2020-10-16 NOTE — Telephone Encounter
-----   Message from Remus Blake, MD sent at 10/15/2020  5:44 PM CDT -----  [name],     How are you doing? Thanks for referring the patient.  I could not find any evidence of a malignancy based on excisional lymph node biopsy and CT scans of the chest, abdomen, and pelvis.  So I have told him to follow-up with me on as-needed basis.    I just want you to keep an eye on his slightly elevated kappa lambda light chain ratio. You could check SPEP, immunofixation, and free light chain ratio annually.  No need to be worried as long as his CBC is normal and his serum calcium and creatinine level are okay.    Charles Walls

## 2020-10-21 ENCOUNTER — Encounter: Admit: 2020-10-21 | Discharge: 2020-10-21 | Payer: No Typology Code available for payment source

## 2020-10-21 NOTE — Telephone Encounter
Phone call placed to pt to notify him that a letter from [employer] was received. 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 Pt states approval of this. Clinic note faxed to [phone] with confirmation received.

## 2020-10-22 ENCOUNTER — Encounter: Admit: 2020-10-22 | Discharge: 2020-10-22 | Payer: No Typology Code available for payment source

## 2020-10-22 NOTE — Telephone Encounter
LOV 07/09/20    Pt wants to know what his appt is for today at 4:30pm. Advised for chronic conditions and f/u to ED visit. Pt v/u. States he has not fasted. Advised that is okay, to still come to appt. Advised that he can return another day in the morning to have lab drawn when he has fasted.

## 2020-10-27 ENCOUNTER — Encounter: Admit: 2020-10-27 | Discharge: 2020-10-27 | Payer: No Typology Code available for payment source

## 2020-10-27 ENCOUNTER — Emergency Department: Admit: 2020-10-27 | Discharge: 2020-10-27 | Payer: No Typology Code available for payment source

## 2020-10-27 DIAGNOSIS — M542 Cervicalgia: Secondary | ICD-10-CM

## 2020-10-27 MED ORDER — CYCLOBENZAPRINE 10 MG PO TAB
10 mg | ORAL_TABLET | Freq: Three times a day (TID) | ORAL | 0 refills | 21.00000 days | Status: AC | PRN
Start: 2020-10-27 — End: ?

## 2020-10-27 MED ORDER — LIDOCAINE 5 % TP PTMD
1 | Freq: Once | TOPICAL | 0 refills | Status: DC
Start: 2020-10-27 — End: 2020-10-27
  Administered 2020-10-27: 19:00:00 1 via TOPICAL

## 2020-10-27 MED ORDER — CYCLOBENZAPRINE 10 MG PO TAB
10 mg | Freq: Once | ORAL | 0 refills | Status: CP
Start: 2020-10-27 — End: ?
  Administered 2020-10-27: 18:00:00 10 mg via ORAL

## 2020-10-27 MED ORDER — KETOROLAC 30 MG/ML (1 ML) IJ SOLN
30 mg | Freq: Once | INTRAMUSCULAR | 0 refills | Status: DC
Start: 2020-10-27 — End: 2020-10-27

## 2020-10-27 NOTE — Unmapped
I recommend following up with your primary care provider in the next couple of days.  Your CT was normal today it did show some degenerative changes.  You can take Tylenol and ibuprofen for your pain.  You can take the muscle relaxer as needed but do not drive while taking the medication.

## 2020-10-27 NOTE — ED Notes
Pt left before receiving d/c instructions. Pt seen ambulating from the department with a steady gait.

## 2020-10-29 ENCOUNTER — Encounter: Admit: 2020-10-29 | Discharge: 2020-10-29 | Payer: No Typology Code available for payment source

## 2020-10-29 NOTE — Telephone Encounter
ED Discharge Follow Up  Reached patient: No; LM to contact PCP office with any questions/concerns, and to schedule f/u appt.    Admission Information  Hospital Name : [facility]  ED Admission Date: 10/27/20 ED Discharge Date: 10/27/20 Admission Diagnosis: Pain, neck  Discharge Diagnosis Neck pain  Hospital Services: Unplanned  Today's call is 2 (calendar) days post discharge    Medication Reconciliation  Changes to pre-ED visit medications? No  Were new prescriptions filled?N/A  Meds reviewed and reconciled? Yes  ? acetaminophen (TYLENOL) 325 mg tablet Take 325 mg by mouth every 6 hours as needed for Pain.   ? ARIPiprazole (ABILIFY) 10 mg tablet Take one-half tablet by mouth daily for 90 days.   ? atorvastatin (LIPITOR) 10 mg tablet TAKE 1 TABLET BY MOUTH DAILY   ? cyclobenzaprine (FLEXERIL) 10 mg tablet Take one tablet by mouth three times daily as needed for Muscle Cramps.   ? nicotine (NICODERM CQ STEP 1) 21 mg/day patch Apply one patch to top of skin as directed daily. Rotate patch location.  Indications: stop smoking   ? sildenafiL (VIAGRA) 50 mg tablet Take one tablet by mouth as Needed for Erectile dysfunction.   ? TRIUMEQ 600-50-300 mg tablet Take 1 Tablet by mouth once daily   ? valACYclovir (VALTREX) 1 gram tablet TAKE 2 TABLETS BY MOUTH EVERY 12 HOURS FOR 4 DAYS. BEGIN AT ONSET OF COLD SORE   ? vitamins, multiple cap Take 1 capsule by mouth daily.         Scheduling Follow-up Appointment  Upcoming appointment date and time and with whom scheduled:   Future Appointments   Date Time Provider Department Center   11/26/2020  9:00 AM El Drema Balzarine I, MD [facility]   12/03/2020 11:00 AM El Drema Balzarine I, MD [facility]   12/25/2020 10:00 AM Noni Saupe, MD [facility]     When was patient?s last PCP visit: Visit date not found  PCP primary location: [facility]  PCP appointment scheduled? No, no future appt  Specialist appointment scheduled? No  Both PCP and Specialist appointment scheduled: No  Is assistance with transportation needed? No  MyChart message sent? Active in MyChart. MyChart message sent.    ED Communication   Did Pt call Clinic prior to going to ED? No      Charles Walls

## 2020-10-30 ENCOUNTER — Encounter: Admit: 2020-10-30 | Discharge: 2020-10-30 | Payer: No Typology Code available for payment source

## 2020-11-01 ENCOUNTER — Encounter: Admit: 2020-11-01 | Discharge: 2020-11-01 | Payer: No Typology Code available for payment source

## 2020-11-01 MED ORDER — SILDENAFIL 50 MG PO TAB
ORAL_TABLET | Freq: Every day | 1 refills
Start: 2020-11-01 — End: ?

## 2020-11-10 ENCOUNTER — Encounter: Admit: 2020-11-10 | Discharge: 2020-11-10 | Payer: No Typology Code available for payment source

## 2020-11-17 NOTE — Progress Notes
Charles Walls is a 50 y.o. male.    Chief Complaint   Patient presents with   ? Cholesterol   ? Emergency Room Follow up     Follow up from neck pain - was seen in the ED at [place]    ? Labs Only     Fasting    ? Finger Pain     RT middle finger - pt has a trigger finger    ? Imm/Inj     Flu - will do today            Subjective:            HPI    ARIANA Walls presents to discuss the following:    ED F/U: Seen at [facility] ED on 10/10 for left sided neck pain beginning after his lymph node biopsies in July. Had a CT C-spine showing:  Radiology Interpretation:  CT SPINE CERVICAL WO CONTRAST   Final Result   ?     1.  No acute cervical fracture or traumatic subluxation.   2.  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   3.  Slight anterolisthesis at C3-C4 and mild anterolisthesis at C4-C5, presumably degenerative.   4.  Stable small right submandibular sialolith.   He was discharged with a lidoderm patch and flexeril.  States he has continued pain in his left neck. It waxes and wanes in severity. Continues to feel a crunch sensation when he rotates his head at times.   Submental lymph node: Had a biopsy earlier this year showing atypical lymphoid proliferation, suspicious for lymphoma. Followed by ENT/oncology. Had an excisional lymph node biopsy with CT scans and there was no evidence of lymphoma found. Oncology recommends annual monitoring of serum protein electrophoresis, immunofixation, and kappa lambda free light chain ratio.    Trigger finger: Reports he slammed his right hand in a door about 1 year ago. Suffered from 'trigger finger' of the middle finger after this time. He wears a brace at night but reports worsening pain in his finger. No swelling. Interested in specialist consult. No numbness/tingling.    Hyperlipidemia Management: Takes atorvastatin 10 mg daily.  LDL goal < 100.   Diet adherence:most of the time   Medication adherence:all of the time   Side effects to medications? No  Lab Results   Component Value Date/Time    CHOL 141 07/03/2020 04:40 AM    TRIG 84 07/03/2020 04:40 AM    HDL 30 (L) 07/03/2020 04:40 AM    LDL 100 (H) 07/03/2020 04:40 AM    VLDL 17 07/03/2020 04:40 AM    NONHDLCHOL 111 07/03/2020 04:40 AM    ALT 21 10/15/2020 10:02 AM    VITD25 26.4 (L) 03/21/2019 03:20 PM        Imp: Hyperlipidemia good     Plan:  Discussed labs and reviewed goals for LDL, HDL, triglycerides.   Discussed exercise management and diet with emphasis on vegetables, fruit and lean meat.  Are barriers to achieving goals present? No  Medication education provided. Patient voiced understanding? Yes  Patient able to self-manage and ready to comply?Yes  Educational resources identified? Verbal Counseling           Review of Systems   Constitutional: Negative for chills, fatigue, fever and unexpected weight change.   Eyes: Negative for visual disturbance.   Respiratory: Negative for cough, chest tightness, shortness of breath and wheezing.    Cardiovascular: Negative  for chest pain, palpitations and leg swelling.   Musculoskeletal: Positive for arthralgias (Right middle finger), neck pain and neck stiffness.   Neurological: Negative for dizziness, syncope, light-headedness, numbness and headaches.   Psychiatric/Behavioral: Negative for dysphoric mood and suicidal ideas. The patient is not nervous/anxious.    All other systems reviewed and are negative.          Your Current Medications:       Instructions    acetaminophen (TYLENOL) 325 mg tablet Take 325 mg by mouth every 6 hours as needed for Pain.    ARIPiprazole (ABILIFY) 10 mg tablet Take one-half tablet by mouth daily for 90 days.    atorvastatin (LIPITOR) 10 mg tablet TAKE 1 TABLET BY MOUTH DAILY    cyclobenzaprine (FLEXERIL) 10 mg tablet Take one tablet by mouth three times daily as needed for Muscle Cramps.    nicotine (NICODERM CQ STEP 1) 21 mg/day patch Apply one patch to top of skin as directed daily. Rotate patch location.  Indications: stop smoking    sildenafiL (VIAGRA) 50 mg tablet TAKE ONE TABLET BY MOUTH EVERY DAY IN THE EVENING    TRIUMEQ 600-50-300 mg tablet Take 1 Tablet by mouth once daily    valACYclovir (VALTREX) 1 gram tablet TAKE 2 TABLETS BY MOUTH EVERY 12 HOURS FOR 4 DAYS. BEGIN AT ONSET OF COLD SORE    vitamins, multiple cap Take 1 capsule by mouth daily.          There are no discontinued medications.      Objective:          Vitals:    11/18/20 1102   BP: 110/64   BP Source: Arm, Right Upper   Pulse: 101   Temp: 36.6 ?C (97.8 ?F)   SpO2: 98%   TempSrc: Oral   PainSc: Five   Weight: 71.2 kg (157 lb)   Height: 163 cm (5' 4.17)     Body mass index is 26.8 kg/m?Marland Kitchen   No LMP for male patient.      Physical Exam  Vitals and nursing note reviewed.   Constitutional:       Appearance: Normal appearance.   HENT:      Head: Normocephalic and atraumatic.      Right Ear: External ear normal.      Left Ear: External ear normal.      Nose: Nose normal. No congestion or rhinorrhea.   Eyes:      Extraocular Movements: Extraocular movements intact.   Cardiovascular:      Rate and Rhythm: Normal rate and regular rhythm.      Heart sounds: Normal heart sounds.   Pulmonary:      Effort: Pulmonary effort is normal.      Breath sounds: Normal breath sounds.   Musculoskeletal:      Right hand: Tenderness (DIP joint) and bony tenderness present. No swelling. Decreased range of motion (3rd digit, limited extension). Normal strength. Normal sensation. Normal capillary refill.      Cervical back: Neck supple. No swelling, rigidity, spasms, torticollis, tenderness or bony tenderness. No pain with movement. Decreased range of motion.   Skin:     General: Skin is warm and dry.      Findings: No rash (on visible skin).   Neurological:      General: No focal deficit present.      Mental Status: He is alert and oriented to person, place, and time.   Psychiatric:  Mood and Affect: Mood normal.         Behavior: Behavior normal. Thought Content: Thought content normal.         Judgment: Judgment normal.         No results found for this or any previous visit (from the past 336 hour(s)).          Assessment and Plan:           [name] was seen today for cholesterol, emergency room follow up, labs only, finger pain and imm/inj.    Diagnoses and all orders for this visit:    Neck pain on left side  -     AMB REFERRAL TO PHYSICAL THERAPY  Spondylosis of cervical joint  -     AMB REFERRAL TO PHYSICAL THERAPY  Reviewed CT C-spine done at the ED with patient today.   Start with PT. Consider [facility] referral if no improvement or worsening.    Trigger middle finger of right hand  -     AMB REFERRAL TO HAND SURGEON  Onset was 1 year ago. Now having worsening pain. There is limitation in extension of the middle digit.   Refer to hand ortho.     Mixed hyperlipidemia  Chronic. Takes atorvastatin 10 mg daily.   Recheck lab today (previously ordered ).     Need for influenza vaccination  -     FLU VACCINE =>6 MONTHS QUADRIVALENT PF  Flu vaccine updated.     Tobacco abuse  -     AMB REFERRAL FOR LUNG CANCER SCREENING  CT Chest in September showing nodules. Refer for eval for future screening.  Encouraged cessation.    I reviewed with the patient their current medications and specifically any new medications prescribed at the time of this visit and we reviewed the expected benefits and potential side effects. All questions are answered to the patient's satisfaction.    Patient education provided regarding diagnosis, course, and treatment. Patient was in agreement to instructions, POC, and will call if questions or concerns arise.     Total Time Today was 37 minutes in the following activities: Preparing to see the patient, Obtaining and/or reviewing separately obtained history, Performing a medically appropriate examination and/or evaluation, Counseling and educating the patient/family/caregiver, Ordering medications, tests, or procedures, Referring and communication with other health care professionals (when not separately reported), Documenting clinical information in the electronic or other health record and Independently interpreting results (not separately reported) and communicating results to the patient/family/caregiver        Return in about 6 months (around 05/18/2021) for physical exam.      Future Appointments   Date Time Provider Department Center   11/18/2020  1:20 PM [facility]   12/03/2020 11:00 AM El Drema Balzarine I, MD [facility]   12/25/2020 10:00 AM Noni Saupe, MD [facility]

## 2020-11-18 ENCOUNTER — Ambulatory Visit: Admit: 2020-11-18 | Discharge: 2020-11-18 | Payer: No Typology Code available for payment source

## 2020-11-18 ENCOUNTER — Encounter: Admit: 2020-11-18 | Discharge: 2020-11-18 | Payer: No Typology Code available for payment source

## 2020-11-18 DIAGNOSIS — E782 Mixed hyperlipidemia: Secondary | ICD-10-CM

## 2020-11-18 DIAGNOSIS — Z720 Tobacco use: Secondary | ICD-10-CM

## 2020-11-18 DIAGNOSIS — M542 Cervicalgia: Secondary | ICD-10-CM

## 2020-11-18 DIAGNOSIS — M65331 Trigger finger, right middle finger: Secondary | ICD-10-CM

## 2020-11-18 DIAGNOSIS — T1491XA Suicide attempt, initial encounter: Secondary | ICD-10-CM

## 2020-11-18 DIAGNOSIS — M47812 Spondylosis without myelopathy or radiculopathy, cervical region: Secondary | ICD-10-CM

## 2020-11-18 DIAGNOSIS — B20 Human immunodeficiency virus [HIV] disease: Secondary | ICD-10-CM

## 2020-11-18 DIAGNOSIS — R931 Abnormal findings on diagnostic imaging of heart and coronary circulation: Secondary | ICD-10-CM

## 2020-11-18 DIAGNOSIS — K5792 Diverticulitis of intestine, part unspecified, without perforation or abscess without bleeding: Secondary | ICD-10-CM

## 2020-11-18 DIAGNOSIS — S129XXA Fracture of neck, unspecified, initial encounter: Secondary | ICD-10-CM

## 2020-11-18 DIAGNOSIS — E785 Hyperlipidemia, unspecified: Secondary | ICD-10-CM

## 2020-11-18 DIAGNOSIS — K219 Gastro-esophageal reflux disease without esophagitis: Secondary | ICD-10-CM

## 2020-11-18 DIAGNOSIS — L409 Psoriasis, unspecified: Secondary | ICD-10-CM

## 2020-11-18 DIAGNOSIS — Z23 Encounter for immunization: Secondary | ICD-10-CM

## 2020-11-18 NOTE — Telephone Encounter
Patient referred for low-dose lung screening CT scan.    Contacted patient to complete screening for high -risk criteria as follows:      Patient contacted: Yes   The patient is currently: 50 y.o.    Current Smoker?  Yes     Smoking history: 29yr x 1 1/2ppd    Pack years: 556yr   Quit smoking within the past 15 years if not currently smoking?  No    Years since quit: NA    Do you have any concerning symptoms:  Asymptomatic    Personal history of lung cancer or other malignancy: Yes     Prior CT CHEST within the past year?   Yes, [facility]  If yes, images and report requested:  yes    High Risk Criteria:   Lung Screen met but deferring 09/2021    Referring Provider: Dr. ShFrench Ana  [facility]

## 2020-11-18 NOTE — Patient Instructions
It was a pleasure to see you today, Charles Walls.     I have placed a referral to PT and a hand specialist. You can call the number listed under the referral to schedule an appointment.    Your labs will be available for you to view on MyChart 24-48 hours after they are drawn. Please allow me 2-3 business days to review all results and post a results message to you. I appreciate your patience and understanding!

## 2020-11-23 ENCOUNTER — Encounter: Admit: 2020-11-23 | Discharge: 2020-11-23 | Payer: No Typology Code available for payment source

## 2020-11-23 MED ORDER — SILDENAFIL 50 MG PO TAB
ORAL_TABLET | Freq: Every evening | 0 refills
Start: 2020-11-23 — End: ?

## 2020-11-26 ENCOUNTER — Encounter: Admit: 2020-11-26 | Discharge: 2020-11-26 | Payer: No Typology Code available for payment source

## 2020-11-26 ENCOUNTER — Ambulatory Visit: Admit: 2020-11-26 | Discharge: 2020-11-26 | Payer: No Typology Code available for payment source

## 2020-11-26 DIAGNOSIS — L409 Psoriasis, unspecified: Secondary | ICD-10-CM

## 2020-11-26 DIAGNOSIS — K219 Gastro-esophageal reflux disease without esophagitis: Secondary | ICD-10-CM

## 2020-11-26 DIAGNOSIS — E785 Hyperlipidemia, unspecified: Secondary | ICD-10-CM

## 2020-11-26 DIAGNOSIS — T1491XA Suicide attempt, initial encounter: Secondary | ICD-10-CM

## 2020-11-26 DIAGNOSIS — S129XXA Fracture of neck, unspecified, initial encounter: Secondary | ICD-10-CM

## 2020-11-26 DIAGNOSIS — M65331 Trigger finger, right middle finger: Secondary | ICD-10-CM

## 2020-11-26 DIAGNOSIS — K5792 Diverticulitis of intestine, part unspecified, without perforation or abscess without bleeding: Secondary | ICD-10-CM

## 2020-11-26 DIAGNOSIS — B20 Human immunodeficiency virus [HIV] disease: Secondary | ICD-10-CM

## 2020-11-26 MED ORDER — NAPROXEN 500 MG PO TAB
500 mg | ORAL_TABLET | Freq: Two times a day (BID) | ORAL | 1 refills | Status: AC
Start: 2020-11-26 — End: ?

## 2020-11-26 MED ORDER — TRIAMCINOLONE ACETONIDE 40 MG/ML IJ SUSP
40 mg | Freq: Once | INTRAMUSCULAR | 0 refills | Status: CP | PRN
Start: 2020-11-26 — End: ?
  Administered 2020-11-26: 17:00:00 40 mg via INTRAMUSCULAR

## 2020-11-26 MED ORDER — LIDOCAINE (PF) 10 MG/ML (1 %) IJ SOLN
1 mL | Freq: Once | INTRAMUSCULAR | 0 refills | Status: CP | PRN
Start: 2020-11-26 — End: ?
  Administered 2020-11-26: 17:00:00 1 mL via INTRAMUSCULAR

## 2020-11-26 NOTE — Patient Instructions
It was a pleasure seeing you today. Please contact us if you have any further questions, we are happy to assist.     Please call our scheduling line at [phone] if you need to schedule another visit.  Please call our nursing line at [phone] for any nursing/medical/casting or splinting questions. Do not hesitate to contact our office with any further questions.Thank you and have a great day!    Matthew Drake, MD   [facility]   Orthopedic & Sports Medicine  Office: [phone]  Fax: [phone]

## 2020-11-26 NOTE — Progress Notes
[facility] Hand Surgery      Date of Service: 11/26/2020    Subjective:            Right middle finger catching    History of Present Illness  This is a 50 year old man presenting with a 6 or 67-month history of catching and locking symptoms of his right middle finger.  There was no injury at the time his symptoms started but he did close his hand in a screen door approximately a year ago.  He never had any treatment for that.  He has been using a splint for the middle finger.  He reports frequent episodes of catching and locking which is quite painful.  It is worse in the morning.  He denies any sensory disturbances.  ITIEL BORNER is a 50 y.o. male.     Review of Systems      Objective:         ? acetaminophen (TYLENOL) 325 mg tablet Take 325 mg by mouth every 6 hours as needed for Pain.   ? ARIPiprazole (ABILIFY) 10 mg tablet Take one-half tablet by mouth daily for 90 days.   ? atorvastatin (LIPITOR) 10 mg tablet TAKE 1 TABLET BY MOUTH DAILY   ? cyclobenzaprine (FLEXERIL) 10 mg tablet Take one tablet by mouth three times daily as needed for Muscle Cramps.   ? nicotine (NICODERM CQ STEP 1) 21 mg/day patch Apply one patch to top of skin as directed daily. Rotate patch location.  Indications: stop smoking   ? sildenafiL (VIAGRA) 50 mg tablet TAKE ONE TABLET BY MOUTH EVERY EVENING   ? TRIUMEQ 600-50-300 mg tablet Take 1 Tablet by mouth once daily   ? valACYclovir (VALTREX) 1 gram tablet TAKE 2 TABLETS BY MOUTH EVERY 12 HOURS FOR 4 DAYS. BEGIN AT ONSET OF COLD SORE   ? vitamins, multiple cap Take 1 capsule by mouth daily.     Vitals:    11/26/20 1059   BP: 115/70   Pulse: 70   PainSc: One   Weight: 70.3 kg (155 lb)   Height: 163 cm (5' 4.17)     Body mass index is 26.46 kg/m?Marland Kitchen     Physical Exam  Constitutional:       General: He is not in acute distress.     Appearance: Normal appearance.   Neurological:      Mental Status: He is oriented to person, place, and time.   Psychiatric: Behavior: Behavior normal.       Ortho Exam  Right hand: The skin is intact, there are no wounds or scars.  There is a small mass over the dorsal aspect of the hand in the area of the EDC tendons at the wrist which is slightly mobile and nontender to palpation.  He has no tenderness in the palm.  He cannot fully extend his middle finger, he is not able to make a full fist due to fear of triggering.  He points directly to the middle finger A1 pulley when asked where the pain will occur.  He has intact sensation and brisk cap refill to all digits.       Assessment and Plan:  's history and examination is consistent with a right middle finger trigger digit.  I would recommend trying some conservative care with a steroid injection as well as oral anti-inflammatories.  He is welcome to follow-up in 1 month if this has not helped.    Tendon Injection  Location:  Long Finger - R long flexor sheath    Consent:   Consent obtained: verbal  Consent given by: patient  Risks discussed: Bleeding, Damage to surrounding structures, Infection and Pain  Alternatives discussed: delayed treatment, alternative treatment, no treatment and referral     Universal Protocol:  Relevant documents: relevant documents present and verified  Patient identity confirmed: Patient identify confirmed verbally with patient.          Procedures Details:       JOINT LOCATION: Long Finger  SITE: R long flexor    Indications: pain   Prep: alcohol  Approach: volar  Medications administered: 40 mg triamcinolone acetonide 40 mg/mL; 1 mL lidocaine PF 1% (10 mg/mL)  Patient tolerance: Patient tolerated the procedure well with no immediate complications. Pressure was applied, and hemostasis was accomplished.                                         Philomena Course, MD  Associate Professor  Orthopedic Hand Surgery

## 2020-12-04 ENCOUNTER — Encounter: Admit: 2020-12-04 | Discharge: 2020-12-04 | Payer: No Typology Code available for payment source

## 2020-12-04 DIAGNOSIS — B20 Human immunodeficiency virus [HIV] disease: Secondary | ICD-10-CM

## 2020-12-04 MED ORDER — TRIUMEQ 600-50-300 MG PO TAB
ORAL_TABLET | Freq: Every day | 3 refills
Start: 2020-12-04 — End: ?

## 2020-12-04 NOTE — Telephone Encounter
Last Lab: 10/15/20  Last Viral Load: 09/19/20  Last STI testing: 09/12/20  Last OV: [id number]  Next OV: 12/10/20

## 2020-12-08 ENCOUNTER — Encounter: Admit: 2020-12-08 | Discharge: 2020-12-08 | Payer: No Typology Code available for payment source

## 2020-12-09 ENCOUNTER — Encounter: Admit: 2020-12-09 | Discharge: 2020-12-09 | Payer: No Typology Code available for payment source

## 2020-12-09 NOTE — Telephone Encounter
Sent mychart msg yesterday to update medication list prior to his telehealth follow up appt with Dr. Mayra Reel tomorrow, msg read but no response yet.    LVM to return call or reply to [place].    If we do not hear back from patient, we will complete medication reassessment when due.    Hubert Azure, PharmD, [place], AAHIVP  [phone]

## 2020-12-15 ENCOUNTER — Encounter: Admit: 2020-12-15 | Discharge: 2020-12-15 | Payer: No Typology Code available for payment source

## 2020-12-22 ENCOUNTER — Encounter: Admit: 2020-12-22 | Discharge: 2020-12-22 | Payer: No Typology Code available for payment source

## 2020-12-22 MED ORDER — ARIPIPRAZOLE 10 MG PO TAB
ORAL_TABLET | Freq: Every day | 0 refills
Start: 2020-12-22 — End: ?

## 2020-12-25 ENCOUNTER — Encounter: Admit: 2020-12-25 | Discharge: 2020-12-25 | Payer: No Typology Code available for payment source

## 2020-12-25 MED ORDER — SILDENAFIL 50 MG PO TAB
ORAL_TABLET | Freq: Every evening | 0 refills
Start: 2020-12-25 — End: ?

## 2020-12-25 NOTE — Telephone Encounter
Recent Visits  Date Type Provider Dept   11/18/20 Appointment [facility]   11/18/20 Office Visit Yetta Numbers, [place] [facility]   07/09/20 Office Visit Yetta Numbers, [place] [facility]   05/16/20 Office Visit Yetta Numbers, [place] [facility]   04/22/20 Office Visit Yetta Numbers, [place] [facility]   Showing recent visits within past 720 days and meeting all other requirements  Future Appointments  Date Type Provider Dept   05/18/21 Appointment Yetta Numbers, APRN-NP [phone] [facility]   Showing future appointments within next 360 days and meeting all other requirements      Last filled 11/24/2020 #10

## 2021-01-06 ENCOUNTER — Encounter: Admit: 2021-01-06 | Discharge: 2021-01-06 | Payer: No Typology Code available for payment source

## 2021-01-06 MED ORDER — VALACYCLOVIR 1 GRAM PO TAB
ORAL_TABLET | 0 refills | Status: AC
Start: 2021-01-06 — End: ?

## 2021-01-06 NOTE — Telephone Encounter
Recent Visits  Date Type Provider Dept   11/18/20 Appointment [facility]   11/18/20 Office Visit Charles Walls, [place] [facility]   07/09/20 Office Visit Charles Walls, [place] [facility]   05/16/20 Office Visit Charles Walls, [place] [facility]   04/22/20 Office Visit Charles Walls, [place] [facility]   Showing recent visits within past 720 days and meeting all other requirements  Future Appointments  Date Type Provider Dept   05/18/21 Appointment Charles Numbers, APRN-NP [phone] [facility]   Showing future appointments within next 360 days and meeting all other requirements    Last filled 05/06/2020 #

## 2021-01-22 ENCOUNTER — Encounter: Admit: 2021-01-22 | Discharge: 2021-01-22 | Payer: No Typology Code available for payment source

## 2021-01-22 NOTE — Telephone Encounter
Samarth called and LVM that he has been sick since Monday. Called [place] to get some more information. He said since onday he has had diarrhea and projectile vomiting. He is able to keep rice down but does endorse fevers of 101 and chills. Headache this morning, but he thinks it could be dehydration. Explained that Dr. Mayra Reel is out of town but his PCP should be able to get him in quicker and evaluate him for what is going on. Transferred him over to his PCP.

## 2021-01-23 ENCOUNTER — Encounter: Admit: 2021-01-23 | Discharge: 2021-01-23 | Payer: No Typology Code available for payment source

## 2021-01-23 ENCOUNTER — Ambulatory Visit: Admit: 2021-01-23 | Discharge: 2021-01-23 | Payer: No Typology Code available for payment source

## 2021-01-23 DIAGNOSIS — A419 Sepsis, unspecified organism: Secondary | ICD-10-CM

## 2021-01-23 DIAGNOSIS — K5792 Diverticulitis of intestine, part unspecified, without perforation or abscess without bleeding: Secondary | ICD-10-CM

## 2021-01-23 DIAGNOSIS — I359 Nonrheumatic aortic valve disorder, unspecified: Secondary | ICD-10-CM

## 2021-01-23 DIAGNOSIS — E785 Hyperlipidemia, unspecified: Secondary | ICD-10-CM

## 2021-01-23 DIAGNOSIS — T1491XA Suicide attempt, initial encounter: Secondary | ICD-10-CM

## 2021-01-23 DIAGNOSIS — E782 Mixed hyperlipidemia: Secondary | ICD-10-CM

## 2021-01-23 DIAGNOSIS — R931 Abnormal findings on diagnostic imaging of heart and coronary circulation: Secondary | ICD-10-CM

## 2021-01-23 DIAGNOSIS — A084 Viral intestinal infection, unspecified: Secondary | ICD-10-CM

## 2021-01-23 DIAGNOSIS — D849 Immunodeficiency, unspecified: Secondary | ICD-10-CM

## 2021-01-23 DIAGNOSIS — B20 Human immunodeficiency virus [HIV] disease: Secondary | ICD-10-CM

## 2021-01-23 DIAGNOSIS — C8591 Non-Hodgkin lymphoma, unspecified, lymph nodes of head, face, and neck: Secondary | ICD-10-CM

## 2021-01-23 DIAGNOSIS — F1994 Other psychoactive substance use, unspecified with psychoactive substance-induced mood disorder: Secondary | ICD-10-CM

## 2021-01-23 DIAGNOSIS — L409 Psoriasis, unspecified: Secondary | ICD-10-CM

## 2021-01-23 DIAGNOSIS — K219 Gastro-esophageal reflux disease without esophagitis: Secondary | ICD-10-CM

## 2021-01-23 DIAGNOSIS — J439 Emphysema, unspecified: Secondary | ICD-10-CM

## 2021-01-23 DIAGNOSIS — S129XXA Fracture of neck, unspecified, initial encounter: Secondary | ICD-10-CM

## 2021-01-23 DIAGNOSIS — D6859 Other primary thrombophilia: Secondary | ICD-10-CM

## 2021-01-23 DIAGNOSIS — Z599 Problem related to housing and economic circumstances, unspecified: Secondary | ICD-10-CM

## 2021-01-23 MED ORDER — ATORVASTATIN 10 MG PO TAB
10 mg | ORAL_TABLET | Freq: Every day | ORAL | 1 refills | Status: AC
Start: 2021-01-23 — End: ?

## 2021-01-23 NOTE — Patient Instructions
It was a pleasure to see you today, Charles Walls.     I am glad you are feeling better and am hopeful you will continue to note improvement over the coming days. Continue to drink water and follow bland diet.     I have sent a refill of atorvastatin to [facility] for you.

## 2021-01-23 NOTE — Progress Notes
Charles Walls is a 51 y.o. male.    Chief Complaint   Patient presents with   ? Patient Reported Other     Vomiting, diarrhea - started Monday night through Tuesday,Started bland diet.  Chills still and some dizziness.  Had fever for the first 2 days - and possibly this morning.  Onset 5 days.  Reports yellow stools. Headache today    ? Imm/Inj     Shingrix - pt is ill today    ? Other     PHQ9 score 0   ? Cholesterol     Patient was not able to get Atorvastatin due to cost.  For some reason the cost was $100 when he went to pick it up.Would like this sent to the [facility] that is listed in chart.            Subjective:            HPI    Charles Walls presents to discuss the following:    GI symptoms:  Onset was 4 days ago. Reports vomiting and diarrhea for the first 2 days. Also had chills, fever, and dizziness. Today, he reports he is doing better. Has some residual dizziness and questions if he had a fever this morning. Stools now more solid, yellow colored. No further vomiting. Been following BRAT diet. Ate rice and chicken broth and has kept it down. Able to drink 2 gallons of water over the past 2 days and kept it all down. Urinating normally.     Atorvastatin: Not currently taking stating the last time he tried to fill the medication it was $100 at [facility]. Requesting to send it to [facility] where it is cheaper.        Review of Systems   Constitutional: Positive for chills, fatigue and fever. Negative for unexpected weight change.   Eyes: Negative for visual disturbance.   Respiratory: Negative for shortness of breath.    Cardiovascular: Negative for chest pain and palpitations.   Gastrointestinal: Positive for diarrhea, nausea and vomiting. Negative for abdominal pain and blood in stool.   Neurological: Positive for dizziness. Negative for syncope, light-headedness, numbness and headaches.   Psychiatric/Behavioral: Negative for dysphoric mood, sleep disturbance and suicidal ideas. The patient is not nervous/anxious.    All other systems reviewed and are negative.          Your Current Medications:       Instructions    acetaminophen (TYLENOL) 325 mg tablet Take 325 mg by mouth every 6 hours as needed for Pain.    ARIPiprazole (ABILIFY) 10 mg tablet TAKE 1/2 TABLET BY MOUTH DAILY    atorvastatin (LIPITOR) 10 mg tablet TAKE 1 TABLET BY MOUTH DAILY    cyclobenzaprine (FLEXERIL) 10 mg tablet Take one tablet by mouth three times daily as needed for Muscle Cramps.    nicotine (NICODERM CQ STEP 1) 21 mg/day patch Apply one patch to top of skin as directed daily. Rotate patch location.  Indications: stop smoking    sildenafiL (VIAGRA) 50 mg tablet TAKE 1 TABLET BY MOUTH EVERY EVENING    TRIUMEQ 600-50-300 mg tablet Take 1 Tablet by mouth once daily    valACYclovir (VALTREX) 1 gram tablet Take 2 Tablets by mouth every 12 (twelve) hours for 4 days for outbreak    vitamins, multiple cap Take 1 capsule by mouth daily.          Medications Discontinued During This Encounter   Medication Reason   ?  atorvastatin (LIPITOR) 10 mg tablet Reorder         Objective:          Vitals:    01/23/21 1433   BP: 100/64   BP Source: Arm, Left Upper   Pulse: 97   Temp: 36.4 ?C (97.6 ?F)   SpO2: 98%   TempSrc: Oral   PainSc: Five   Weight: 68.5 kg (151 lb)   Height: 163 cm (5' 4.17)     Body mass index is 25.78 kg/m?Marland Kitchen   No LMP for male patient.      Physical Exam  Vitals and nursing note reviewed.   Constitutional:       Appearance: Normal appearance.   HENT:      Head: Normocephalic and atraumatic.      Right Ear: External ear normal.      Left Ear: External ear normal.      Nose: Nose normal. No congestion or rhinorrhea.   Eyes:      Extraocular Movements: Extraocular movements intact.   Cardiovascular:      Rate and Rhythm: Normal rate and regular rhythm.      Heart sounds: Normal heart sounds.   Pulmonary:      Effort: Pulmonary effort is normal.      Breath sounds: Normal breath sounds.   Abdominal:      General: Abdomen is flat. Bowel sounds are normal. There is no distension.      Palpations: Abdomen is soft. There is no mass.      Tenderness: There is no abdominal tenderness.   Musculoskeletal:         General: Normal range of motion.      Cervical back: Normal range of motion and neck supple.   Skin:     General: Skin is warm and dry.      Findings: No rash (on visible skin).   Neurological:      General: No focal deficit present.      Mental Status: He is alert and oriented to person, place, and time.   Psychiatric:         Mood and Affect: Mood normal.         Behavior: Behavior normal.         Thought Content: Thought content normal.         Judgment: Judgment normal.         No results found for this or any previous visit (from the past 336 hour(s)).          Assessment and Plan:           Charles Walls was seen today for patient reported other, imm/inj, other and cholesterol.    Diagnoses and all orders for this visit:    Viral gastroenteritis  Symptoms improving.   Advised patient symptoms likely of viral etiology and will continue to improve with time and supportive measures.   Continue to push clear fluids.  Encouraged continued BRAT diet gradually transitioning to normal diet as tolerating avoiding dairy until better.   Notify office or RTC for return of symptoms or development of abdominal pain.    Lymphoma of lymph nodes of neck, unspecified lymphoma type (HCC)  Chronic. Followed by [facility]. Lymphoma not confirmed with recent biopsy. Monitoring SPEP, immunofixation, and free light chain ration annually.     Pulmonary emphysema, unspecified emphysema type (HCC)  Noted on CT Chest 09/2020. Patient is asymptomatic.     Immunodeficiency, unspecified (HCC)  Human  immunodeficiency virus (HIV) disease (HCC)  Followed by [facility].    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 Followed by [facility].   He remains sober. Denies drug use since June 2022.     Other primary thrombophilia (HCC)  Tending with labs. Stable.    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  Mixed hyperlipidemia  -     atorvastatin (LIPITOR) 10 mg tablet; Take one tablet by mouth daily.  Calcification of aortic valve  -     atorvastatin (LIPITOR) 10 mg tablet; Take one tablet by mouth daily.  Chronic. Takes atorvastatin 10 mg. States he attempted to fill it 1-2 months ago and instead of $15 it was $100 at [facility]. He requested to have new script sent to [facility] pharmacy.    I reviewed with the patient their current medications and specifically any new medications prescribed at the time of this visit and we reviewed the expected benefits and potential side effects. All questions are answered to the patient's satisfaction.    Patient education provided regarding diagnosis, course, and treatment. Patient was in agreement to instructions, POC, and will call if questions or concerns arise.     Total Time Today was 30 minutes in the following activities: Preparing to see the patient, Obtaining and/or reviewing separately obtained history, Performing a medically appropriate examination and/or evaluation, Counseling and educating the patient/family/caregiver, Ordering medications, tests, or procedures and Documenting clinical information in the electronic or other health record        No follow-ups on file.      Future Appointments   Date Time Provider Department Center   05/18/2021  9:00 AM Rada Hay, APRN-NP [facility]

## 2021-02-04 ENCOUNTER — Encounter: Admit: 2021-02-04 | Discharge: 2021-02-04 | Payer: No Typology Code available for payment source

## 2021-02-04 ENCOUNTER — Emergency Department
Admit: 2021-02-04 | Discharge: 2021-02-04 | Discharge status: Against Medical Advice | Payer: No Typology Code available for payment source

## 2021-02-04 NOTE — ED Notes
Pt reports he is leaving. Pt updated on coming back if symptoms change. Pt alert and oriented x4, and walked out with steady gait.

## 2021-02-05 ENCOUNTER — Emergency Department: Admit: 2021-02-05 | Discharge: 2021-02-05 | Payer: No Typology Code available for payment source

## 2021-02-05 ENCOUNTER — Inpatient Hospital Stay: Admit: 2021-02-05 | Payer: No Typology Code available for payment source

## 2021-02-05 ENCOUNTER — Encounter: Admit: 2021-02-05 | Discharge: 2021-02-05 | Payer: No Typology Code available for payment source

## 2021-02-05 DIAGNOSIS — K219 Gastro-esophageal reflux disease without esophagitis: Secondary | ICD-10-CM

## 2021-02-05 DIAGNOSIS — R000 Tachycardia, unspecified: Secondary | ICD-10-CM

## 2021-02-05 DIAGNOSIS — S129XXA Fracture of neck, unspecified, initial encounter: Secondary | ICD-10-CM

## 2021-02-05 DIAGNOSIS — L03114 Cellulitis of left upper limb: Secondary | ICD-10-CM

## 2021-02-05 DIAGNOSIS — T1491XA Suicide attempt, initial encounter: Secondary | ICD-10-CM

## 2021-02-05 DIAGNOSIS — B20 Human immunodeficiency virus [HIV] disease: Secondary | ICD-10-CM

## 2021-02-05 DIAGNOSIS — R700 Elevated erythrocyte sedimentation rate: Secondary | ICD-10-CM

## 2021-02-05 DIAGNOSIS — A419 Sepsis, unspecified organism: Secondary | ICD-10-CM

## 2021-02-05 DIAGNOSIS — K5792 Diverticulitis of intestine, part unspecified, without perforation or abscess without bleeding: Secondary | ICD-10-CM

## 2021-02-05 DIAGNOSIS — L02419 Cutaneous abscess of limb, unspecified: Secondary | ICD-10-CM

## 2021-02-05 DIAGNOSIS — E785 Hyperlipidemia, unspecified: Secondary | ICD-10-CM

## 2021-02-05 DIAGNOSIS — R7982 Elevated C-reactive protein (CRP): Secondary | ICD-10-CM

## 2021-02-05 DIAGNOSIS — L409 Psoriasis, unspecified: Secondary | ICD-10-CM

## 2021-02-05 DIAGNOSIS — L02414 Cutaneous abscess of left upper limb: Secondary | ICD-10-CM

## 2021-02-05 LAB — CBC AND DIFF
ABSOLUTE BASO COUNT: 0 K/UL (ref 0–0.20)
ABSOLUTE EOS COUNT: 0.1 K/UL (ref 0–0.45)
ABSOLUTE MONO COUNT: 1.4 K/UL — ABNORMAL HIGH (ref 0–0.80)
WBC COUNT: 12 K/UL — ABNORMAL HIGH (ref 4.5–11.0)

## 2021-02-05 LAB — URINALYSIS DIPSTICK REFLEX TO CULTURE
GLUCOSE,UA: NEGATIVE mg/dL (ref 0.3–1.2)
LEUKOCYTES: NEGATIVE K/UL — ABNORMAL HIGH (ref 3–12)
NITRITE: NEGATIVE U/L (ref 7–56)
URINE ASCORBIC ACID, UA: NEGATIVE mL/min (ref 1.0–4.8)
URINE BILE: NEGATIVE U/L — ABNORMAL LOW (ref 25–110)
URINE KETONE: NEGATIVE g/dL (ref 3.5–5.0)

## 2021-02-05 LAB — URINALYSIS MICROSCOPIC REFLEX TO CULTURE

## 2021-02-05 LAB — SED RATE: ESR: 75 mm/h — ABNORMAL HIGH (ref 0–20)

## 2021-02-05 LAB — C REACTIVE PROTEIN (CRP): C-REACTIVE PROTEIN: 9.5 mg/dL — ABNORMAL HIGH (ref <1.0)

## 2021-02-05 LAB — COMPREHENSIVE METABOLIC PANEL: SODIUM: 138 MMOL/L (ref 137–147)

## 2021-02-05 LAB — POC LACTATE: LACTIC ACID POC: 0.8 MMOL/L (ref 0.5–2.0)

## 2021-02-05 MED ORDER — LACTATED RINGERS IV SOLP
1000 mL | INTRAVENOUS | 0 refills | Status: CP
Start: 2021-02-05 — End: ?
  Administered 2021-02-05: 22:00:00 1000 mL via INTRAVENOUS

## 2021-02-05 MED ORDER — MORPHINE 4 MG/ML IV SYRG
8 mg | Freq: Once | INTRAVENOUS | 0 refills | Status: CP
Start: 2021-02-05 — End: ?
  Administered 2021-02-05: 23:00:00 8 mg via INTRAVENOUS

## 2021-02-05 MED ORDER — FENTANYL CITRATE (PF) 50 MCG/ML IJ SOLN
100 ug | Freq: Once | INTRAVENOUS | 0 refills | Status: CP
Start: 2021-02-05 — End: ?
  Administered 2021-02-06: 01:00:00 100 ug via INTRAVENOUS

## 2021-02-05 MED ORDER — LAMIVUDINE 100 MG PO TAB
300 mg | Freq: Every day | ORAL | 0 refills | Status: AC
Start: 2021-02-05 — End: ?
  Administered 2021-02-06 – 2021-02-10 (×5): 300 mg via ORAL

## 2021-02-05 MED ORDER — ARIPIPRAZOLE 5 MG PO TAB
5 mg | Freq: Every day | ORAL | 0 refills | Status: AC
Start: 2021-02-05 — End: ?
  Administered 2021-02-06 – 2021-02-10 (×5): 5 mg via ORAL

## 2021-02-05 MED ORDER — ABACAVIR 300 MG PO TAB
600 mg | Freq: Every day | ORAL | 0 refills | Status: AC
Start: 2021-02-05 — End: ?
  Administered 2021-02-06 – 2021-02-10 (×5): 600 mg via ORAL

## 2021-02-05 MED ORDER — ENOXAPARIN 40 MG/0.4 ML SC SYRG
40 mg | Freq: Every day | SUBCUTANEOUS | 0 refills | Status: AC
Start: 2021-02-05 — End: ?
  Administered 2021-02-06 – 2021-02-07 (×2): 40 mg via SUBCUTANEOUS

## 2021-02-05 MED ORDER — ATORVASTATIN 10 MG PO TAB
10 mg | Freq: Every day | ORAL | 0 refills | Status: AC
Start: 2021-02-05 — End: ?
  Administered 2021-02-06 – 2021-02-10 (×4): 10 mg via ORAL

## 2021-02-05 MED ORDER — HYDROMORPHONE (PF) 2 MG/ML IJ SYRG
1 mg | Freq: Once | INTRAVENOUS | 0 refills | Status: CP
Start: 2021-02-05 — End: ?
  Administered 2021-02-06: 1 mg via INTRAVENOUS

## 2021-02-05 MED ORDER — DOLUTEGRAVIR 50 MG PO TAB
50 mg | Freq: Every day | ORAL | 0 refills | Status: AC
Start: 2021-02-05 — End: ?
  Administered 2021-02-06 – 2021-02-10 (×5): 50 mg via ORAL

## 2021-02-05 MED ORDER — MORPHINE 4 MG/ML IV SYRG
4 mg | Freq: Once | INTRAVENOUS | 0 refills | Status: CP
Start: 2021-02-05 — End: ?
  Administered 2021-02-05: 21:00:00 4 mg via INTRAVENOUS

## 2021-02-05 MED ORDER — PIPERACILLIN/TAZOBACTAM 4.5 G/100ML NS IVPB (MB+)
4.5 g | Freq: Once | INTRAVENOUS | 0 refills | Status: CP
Start: 2021-02-05 — End: ?
  Administered 2021-02-06 (×2): 4.5 g via INTRAVENOUS

## 2021-02-05 NOTE — ED Provider Notes
Charles Walls is a 51 y.o. male.    Chief Complaint:  Chief Complaint   Patient presents with   ? Wrist Injury     Wrestling around Monday night; injured L wrist. Swelling started Wednesday. Tylenol taken this morning.        History of Present Illness:  Charles Walls is a 51 y.o. male, with a history of HIV, hyperlipidemia, sepsis, who presents to the emergency department for a wrist injury. Patient reports worsening pain and swelling secondary to a left wrist injury sustained from having it pinned to the ground while wrestling on 1/16.  He notes that he had a knot on the affected wrist for several months, and suspects the injury made it worse. He last took Tylenol this morning. He attempted to have it examined here on 1/16, but left as the ED was busy. He is unsure of any fever, but denies chest pain, shortness of breath, N/V, dysuria, and hematuria.      History provided by:  Patient  Language interpreter used: No        Review of Systems:  Review of Systems   Constitutional: Negative for chills, diaphoresis and fever.   HENT: Negative for congestion, rhinorrhea and sore throat.    Eyes: Negative for visual disturbance.   Respiratory: Negative for cough, chest tightness and shortness of breath.    Cardiovascular: Negative for chest pain.   Gastrointestinal: Negative for abdominal pain, diarrhea, nausea and vomiting.   Genitourinary: Negative for difficulty urinating, dysuria, flank pain and hematuria.   Musculoskeletal: Positive for arthralgias and myalgias.        Left hand swelling   Skin: Positive for color change. Negative for rash.   Allergic/Immunologic: Positive for immunocompromised state.   Neurological: Negative for weakness, numbness and headaches.   Hematological: Does not bruise/bleed easily.   Psychiatric/Behavioral: Negative for confusion.       Allergies:  Sulfa (sulfonamide antibiotics)    Past Medical History:  Medical History:   Diagnosis Date   ? Diverticulitis 02/02/2017   ? Fracture cervical vertebra-closed (HCC)     c6, c7 - no surgical repair   ? Gastroesophageal reflux disease without esophagitis 10/05/2019    Ordered omeprazole 40mg  qday before breakfast.  Discussed lifestyle modifications. No current red flag symptoms.  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 Human immunodeficiency virus (HIV) disease (HCC)    ? Hyperlipidemia 06/13/2013   ? Psoriasis, unspecified 02/21/2014   ? Sepsis (HCC) 03/20/2019   ? Suicide attempt [facility])        Past Surgical History:  Surgical History:   Procedure Laterality Date   ? COLONOSCOPY N/A 06/17/2015    Performed by Jolee Ewing, MD at [facility] ENDO   ? ROBOT ASSISTED LAPAROSCOPIC NEPHRECTOMY PARTIAL WITH INTRAOPERATIVE ULTRASOUND  Left 06/09/2017    Performed by Daryl Eastern, MD at [facility] OR   ? BIOPSY/ EXCISION LYMPH NODE DEEP CERVICAL Left 08/11/2020    Performed by Alpha Gula, MD at [facility] OR   ? HERNIA REPAIR     ? HX APPENDECTOMY     ? PR LAPAROSCOPY SURG RPR INITIAL INGUINAL HERNIA      right       Pertinent medical/surgical history reviewed  Medical History:   Diagnosis Date   ? Diverticulitis 02/02/2017   ? Fracture cervical vertebra-closed (HCC)     c6, c7 - no surgical repair   ? Gastroesophageal reflux disease without esophagitis 10/05/2019    Ordered omeprazole  40mg  qday before breakfast.  Discussed lifestyle modifications. No current red flag symptoms.  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 Human immunodeficiency virus (HIV) disease (HCC)    ? Hyperlipidemia 06/13/2013   ? Psoriasis, unspecified 02/21/2014   ? Sepsis (HCC) 03/20/2019   ? Suicide attempt [facility])      Surgical History:   Procedure Laterality Date   ? COLONOSCOPY N/A 06/17/2015    Performed by Jolee Ewing, MD at [facility] ENDO   ? ROBOT ASSISTED LAPAROSCOPIC NEPHRECTOMY PARTIAL WITH INTRAOPERATIVE ULTRASOUND  Left 06/09/2017    Performed by Daryl Eastern, MD at [facility] OR   ? BIOPSY/ EXCISION LYMPH NODE DEEP CERVICAL Left 08/11/2020    Performed by Alpha Gula, MD at [facility] OR   ? HERNIA REPAIR     ? HX APPENDECTOMY     ? PR LAPAROSCOPY SURG RPR INITIAL INGUINAL HERNIA      right       Social History:  Social History     Tobacco Use   ? Smoking status: Every Day     Packs/day: 1.50     Years: 36.00     Pack years: 54.00     Types: Cigarettes     Start date: 64   ? Smokeless tobacco: Never   Vaping Use   ? Vaping Use: Former   Substance Use Topics   ? 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     Social History     Substance and Sexual Activity   Drug Use Yes   ? 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             Family History:  Family History   Problem Relation Age of Onset   ? Melanoma Father    ? Cancer Father         oral mandibular   ? High Cholesterol Mother    ? Cancer-Colon Maternal Aunt 70   ? Diabetes Neg Hx    ? Hypertension Neg Hx    ? Cancer-Breast Neg Hx    ? Cancer-Ovarian Neg Hx    ? Cancer-Prostate Neg Hx        Vitals:  ED Vitals    Date and Time T BP P RR SPO2P SPO2 User   02/05/21 1730 -- -- -- -- 98 99 % MW   02/05/21 1625 -- -- -- -- 97 98 % MW   02/05/21 1624 -- 123/86 -- -- -- -- MW   02/05/21 1424 -- -- -- -- 109 96 % MW   02/05/21 1413 -- 127/82 -- -- -- -- MW   02/05/21 1346 36.7 ?C (98.1 ?F) 134/75 104 17 PER MINUTE -- 100 % LN          Physical Exam:  Physical Exam  Vitals and nursing note reviewed.   Constitutional:       General: He is not in acute distress.     Appearance: Normal appearance. He is not ill-appearing, toxic-appearing or diaphoretic.   HENT:      Head: Normocephalic and atraumatic.      Right Ear: External ear normal.      Left Ear: External ear normal.  Nose: Nose normal.   Eyes:      Conjunctiva/sclera: Conjunctivae normal.   Cardiovascular:      Rate and Rhythm: Regular rhythm. Tachycardia present.      Pulses: Normal pulses.           Radial pulses are 2+ on the right side and 2+ on the left side.      Heart sounds: Normal heart sounds. No murmur heard.  Pulmonary:      Effort: Pulmonary effort is normal.      Breath sounds: Normal breath sounds. No wheezing, rhonchi or rales.   Abdominal:      General: Abdomen is flat. Bowel sounds are normal. There is no distension.      Palpations: Abdomen is soft. There is no mass.      Tenderness: There is no abdominal tenderness. There is no right CVA tenderness, left CVA tenderness, guarding or rebound.   Musculoskeletal:      Right hand: Normal.      Left hand: Swelling and tenderness present. Normal strength. Normal sensation. Normal pulse.      Cervical back: Neck supple.      Right lower leg: No edema.      Left lower leg: No edema.      Comments: Moderate swelling to left hand with diffuse erythema that extends up the forearm, stops just inferior to elbow, warm to touch  4 cm x 5 cm area of swelling noted to dorsal aspect of L wrist, fluctuant, warm, tender  Cap refill 2 sec  Flexor and extensor tendons intact w/o pain  Negative Kanavel signs     Skin:     General: Skin is warm and dry.      Capillary Refill: Capillary refill takes less than 2 seconds.   Neurological:      Mental Status: He is alert and oriented to person, place, and time.      GCS: GCS eye subscore is 4. GCS verbal subscore is 5. GCS motor subscore is 6.   Psychiatric:         Mood and Affect: Mood normal.         Behavior: Behavior normal.         Thought Content: Thought content normal.         Judgment: Judgment normal.                 Laboratory Results:  Labs Reviewed   CBC AND DIFF - Abnormal       Result Value Ref Range Status    White Blood Cells 12.0 (*) 4.5 - 11.0 K/UL Final    RBC 4.64  4.4 - 5.5 M/UL Final    Hemoglobin 14.4  13.5 - 16.5 GM/DL Final    Hematocrit 95.6  40 - 50 % Final    MCV 94.3  80 - 100 FL Final    MCH 31.1  26 - 34 PG Final    MCHC 33.0  32.0 - 36.0 G/DL Final    RDW 21.3  11 - 15 % Final    Platelet Count 536 (*) 150 - 400 K/UL Final    MPV 7.5 7 - 11 FL Final    Neutrophils 72  41 - 77 % Final    Lymphocytes 15 (*) 24 - 44 % Final    Monocytes 12  4 - 12 % Final    Eosinophils 1  0 - 5 % Final    Basophils 0  0 -  2 % Final    Absolute Neutrophil Count 8.60 (*) 1.8 - 7.0 K/UL Final    Absolute Lymph Count 1.84  1.0 - 4.8 K/UL Final    Absolute Monocyte Count 1.44 (*) 0 - 0.80 K/UL Final    Absolute Eosinophil Count 0.11  0 - 0.45 K/UL Final    Absolute Basophil Count 0.03  0 - 0.20 K/UL Final   C REACTIVE PROTEIN (CRP) - Abnormal    C-Reactive Protein 9.58 (*) <1.0 MG/DL Final   SED RATE - Abnormal    Sed Rate -ESR 75 (*) 0 - 20 MM/HR Final   URINALYSIS DIPSTICK REFLEX TO CULTURE - Abnormal    Color,UA YELLOW   Final    Turbidity,UA 1+ (*) CLEAR-CLEAR Final    Specific Gravity-Urine 1.023  1.005 - 1.030 Final    pH,UA 6.0  5.0 - 8.0 Final    Protein,UA NEG  NEG-NEG Final    Glucose,UA NEG  NEG-NEG Final    Ketones,UA NEG  NEG-NEG Final    Bilirubin,UA NEG  NEG-NEG Final    Blood,UA 1+ (*) NEG-NEG Final    Urobilinogen,UA NORMAL  NORM-NORMAL Final    Nitrite,UA NEG  NEG-NEG Final    Leukocytes,UA NEG  NEG-NEG Final    Urine Ascorbic Acid, UA NEG  NEG-NEG Final   CULTURE-BLOOD W/SENSITIVITY   CULTURE-BLOOD W/SENSITIVITY   CULTURE-ANAEROBIC   CULTURE-WOUND/TISSUE/FLUID(AEROBIC ONLY)W/SENSITIVITY   COMPREHENSIVE METABOLIC PANEL    Sodium 138  454 - 147 MMOL/L Final    Potassium 4.3  3.5 - 5.1 MMOL/L Final    Chloride 102  98 - 110 MMOL/L Final    Glucose 86  70 - 100 MG/DL Final    Blood Urea Nitrogen 18  7 - 25 MG/DL Final    Creatinine 0.98  0.4 - 1.24 MG/DL Final    Calcium 9.8  8.5 - 10.6 MG/DL Final    Total Protein 8.0  6.0 - 8.0 G/DL Final    Total Bilirubin 0.3  0.3 - 1.2 MG/DL Final    Albumin 4.4  3.5 - 5.0 G/DL Final    Alk Phosphatase 85  25 - 110 U/L Final    AST (SGOT) 14  7 - 40 U/L Final    CO2 27  21 - 30 MMOL/L Final    ALT (SGPT) 14  7 - 56 U/L Final    Anion Gap 9  3 - 12 Final    eGFR >60  >60 mL/min Final   URINALYSIS MICROSCOPIC REFLEX TO CULTURE    WBCs,UA 0-2  0 - 2 /HPF Final    RBCs,UA 0-2  0 - 3 /HPF Final    Comment,UA     Final    Value: Criteria for reflex to culture are WBC>10, Positive Nitrite, and/or >=+1   leukocytes. If quantity is not sufficient, an addendum will follow.      MucousUA TRACE   Final   POC LACTATE    LACTIC ACID POC 0.8  0.5 - 2.0 MMOL/L Final   UA GREY TOP TUBE   CLEAR TOP EXTRA URINE TUBE   POC LACTATE   POC LACTATE          Radiology Interpretation:    US DOPPLER VENOUS LEFT   Final Result         1.  Complex fluid collection in the subcutaneous tissues of the posterior aspect of the left wrist measures up to 3.5 cm. There is surrounding hyperemia, consistent with abscess.  2.  Severe diffuse edema of the posterior aspect of the wrist.   3.  The left arm is negative for DVT.   4.  Nonocclusive superficial thrombus in the left cephalic vein extending from the high upper arm to the level of the wrist.      Dr. Ileene Musa discussed the findings with NP Alan Mulder by telephone at 4:51 PM on 02/05/2021.                Approved by Milus Height, MD on 02/05/2021 4:58 PM      By my electronic signature, I attest that I have personally reviewed the images for this examination and formulated the interpretations and opinions expressed in this report          Finalized by Kathyrn Lass, M.D. on 02/05/2021 5:00 PM. Dictated by Milus Height, MD on 02/05/2021 4:26 PM.         WRIST COMP MIN 3 VIEWS LEFT   Final Result         1.  No acute osseous abnormality. Normal alignment.      2.  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      3.  Soft tissue swelling at the radial aspect of the distal forearm which is nonspecific and may reflect contusion.                Finalized by Claudina Lick, M.D. on 02/05/2021 1:57 PM. Dictated by Claudina Lick, M.D. on 02/05/2021 1:55 PM.               EKG:      Medical Decision Making:  Charles Walls is a 51 y.o. male who presents with chief complaint as listed above. Based on the history and presentation, the list of differential diagnoses considered included, but was not limited to: Fracture, dislocation, abscess, cellulitis, DVT    ED Course    51 year old male presents to the emergency department for worsening left wrist pain, swelling, and redness that extends to his upper extremity x3 days.  He denies any systemic symptoms.  He does have a history of HIV and is on antiretrovirals.  Patient is compliant with taking medication.  See HPI above.    Physical exam is pertinent for moderate diffuse swelling to the left hand that extends to the left forearm with associated erythema and increased warmth to touch.  He does have an area along the dorsal aspect of the left wrist that is swollen, fluctuant, tender to touch.  Radial pulses 2+.  Cap refill 2 seconds.  Negative Kanavel signs.  Remaining physical exam is as noted above.  He does present tachycardic with heart rate of 109 bpm.  Afebrile, normotensive.    Reviewed case with Dr. Festus Barren.    UA: Negative for any acute findings.  CBC and CMP ordered and reviewed.  Patient does have leukocytosis (12).  ESR (75) and CRP (9.58) are elevated  Blood cultures x2.  Results are pending.  Lactic acid is WNL    Patient given 1 L LR, IV morphine x2 doses, Dilaudid 1 mg IV.    X-ray of left wrist is negative for any acute osseous abnormalities.  Normal alignment.  Ultrasound of left upper extremity shows a complex fluid collection in the subcutaneous tissues of the posterior aspect of the left wrist measuring up to 3.5 cm.  There is surrounding hyperemia, consistent with abscess.  Severe diffuse edema the posterior aspect of the wrist.  Left arm  is negative for DVT.  Nonocclusive superficial thrombus in the left cephalic vein extending from the high upper arm to the level of the wrist.    Plastic surgery contacted for consult.  See their associated note.    Pt started on IV Zosyn.    Patient admitted to family medicine.         Complexity of Problems Addressed  Patient's active diagnoses as well as contributing pre-existing medical problems include:  Clinical Impression   Cellulitis of left upper extremity   Abscess of wrist   HIV infection, unspecified symptom status (HCC)   Tachycardia   Elevated erythrocyte sedimentation rate   Elevated C-reactive protein (CRP)     Evaluation performed for potential threat to life or bodily function during this visit given the initial differential diagnosis and clinical impression(s) as discussed previously in MDM/ED course.    Additional data reviewed:    ? History was obtained from an independent historian: Not in addition to what is mentioned above  ? Prior non-ED notes reviewed: Not in addition to what is mentioned above  ? Independent interpretation of diagnostic tests was performed by me: Not in addition to what is mentioned above  ? Patient presentation/management was discussed with the following qualified health care professionals and/or other relevant professionals: Not in addition to what is mentioned above    Risk evaluation:    ? Diagnosis or treatment of patient condition impacted by social determinant of health: None    ED Scoring:                                Facility Administered Meds:  Medications   piperacillin/tazobactam (ZOSYN) 4.5 g in sodium chloride 0.9% (NS) 100 mL IVPB (MB+) (4.5 g Intravenous Given - New Bag 02/05/21 1844)   morphine injection 4 mg (4 mg Intravenous Given 02/05/21 1503)   lactated ringers infusion (0 mL Intravenous Infusion Stopped 02/05/21 1805)   morphine injection 8 mg (8 mg Intravenous Given 02/05/21 1641)   HYDROmorphone injection (DILAUDID) 1 mg (1 mg Intravenous Given 02/05/21 1805)   SODIUM CHLORIDE 0.9 % IR SOLN (Cabinet Override) (0 mL  Infusion Stopped 02/05/21 1808)   fentaNYL citrate PF (SUBLIMAZE) injection 100 mcg (100 mcg Intravenous Given 02/05/21 1841)       Clinical Impression:  Clinical Impression   Cellulitis of left upper extremity   Abscess of wrist   HIV infection, unspecified symptom status (HCC)   Tachycardia   Elevated erythrocyte sedimentation rate   Elevated C-reactive protein (CRP)       Disposition/Follow up  ED Disposition     ED Disposition   Admit           No follow-up provider specified.    Medications:  New Prescriptions    No medications on file       Procedure Notes:  Procedures    Attestation / Supervision:  INovella Olive, am scribing for and in the presence of Alan Mulder, APRN.      Novella Olive    Attestation / Supervision Note concerning Robyne AskewSoyla Murphy Yuriana Gaal, APRN-NP, personally performed the services described in this documentation as scribed in my presence and it is both accurate and complete.    Radford Pax Glennis Montenegro, APRN-NP

## 2021-02-05 NOTE — ED Notes
Pt ambulated to ED 63 with a steady gait. Pt presents with c/o wrist injury. Pt reports wrestling with his boyfriend on Monday night and he pinned his arms behind his head. He started having pain and swelling in his left wrist/hand on Tuesday that has continued to worsen. He has taken tylenol and ibuprofen at home with no relief. Redness,warmth, and swelling noted to left hand and wrist. Pt reports pain radiates up into his left axilla and is tender upon palpation.

## 2021-02-06 ENCOUNTER — Encounter: Admit: 2021-02-06 | Discharge: 2021-02-06 | Payer: No Typology Code available for payment source

## 2021-02-06 DIAGNOSIS — E785 Hyperlipidemia, unspecified: Secondary | ICD-10-CM

## 2021-02-06 DIAGNOSIS — L409 Psoriasis, unspecified: Secondary | ICD-10-CM

## 2021-02-06 DIAGNOSIS — K5792 Diverticulitis of intestine, part unspecified, without perforation or abscess without bleeding: Secondary | ICD-10-CM

## 2021-02-06 DIAGNOSIS — K219 Gastro-esophageal reflux disease without esophagitis: Secondary | ICD-10-CM

## 2021-02-06 DIAGNOSIS — B20 Human immunodeficiency virus [HIV] disease: Secondary | ICD-10-CM

## 2021-02-06 DIAGNOSIS — T1491XA Suicide attempt, initial encounter: Secondary | ICD-10-CM

## 2021-02-06 DIAGNOSIS — A419 Sepsis, unspecified organism: Secondary | ICD-10-CM

## 2021-02-06 DIAGNOSIS — S129XXA Fracture of neck, unspecified, initial encounter: Secondary | ICD-10-CM

## 2021-02-06 MED ADMIN — PIPERACILLIN-TAZOBACTAM 4.5 GRAM IV SOLR [80419]: 4.5 g | INTRAVENOUS | @ 22:00:00 | NDC 60505615900

## 2021-02-06 MED ADMIN — DEXTROSE 5% IN WATER IV SOLP [2364]: 1000 mg | INTRAVENOUS | @ 16:00:00 | NDC 00338001702

## 2021-02-06 MED ADMIN — ACETAMINOPHEN 325 MG PO TAB [101]: 650 mg | ORAL | @ 22:00:00 | NDC 00904677361

## 2021-02-06 MED ADMIN — SODIUM CHLORIDE 0.9 % IV PGBK (MB+) [95161]: 4.5 g | INTRAVENOUS | @ 15:00:00 | NDC 00338915930

## 2021-02-06 MED ADMIN — PIPERACILLIN-TAZOBACTAM 4.5 GRAM IV SOLR [80419]: 4.5 g | INTRAVENOUS | @ 15:00:00 | NDC 00409339011

## 2021-02-06 MED ADMIN — SODIUM CHLORIDE 0.9 % IV PGBK (MB+) [95161]: 4.5 g | INTRAVENOUS | @ 22:00:00 | NDC 00338915930

## 2021-02-06 MED ADMIN — FENTANYL CITRATE (PF) 50 MCG/ML IJ SOLN [3037]: 25 ug | INTRAVENOUS | @ 13:00:00 | Stop: 2021-02-06 | NDC 00409909412

## 2021-02-06 MED ADMIN — SODIUM CHLORIDE 0.9 % IR SOLN [11403]: Stop: 2021-02-06 | NDC 00338004804

## 2021-02-06 MED ADMIN — ONDANSETRON HCL (PF) 4 MG/2 ML IJ SOLN [136012]: 4 mg | INTRAVENOUS | @ 14:00:00 | Stop: 2021-02-06 | NDC 72266012301

## 2021-02-06 MED ADMIN — FENTANYL CITRATE (PF) 50 MCG/ML IJ SOLN [3037]: 25 ug | INTRAVENOUS | @ 15:00:00 | Stop: 2021-02-06 | NDC 00409909412

## 2021-02-06 MED ADMIN — VANCOMYCIN 1,000 MG IV SOLR [8442]: 1000 mg | INTRAVENOUS | @ 16:00:00 | NDC 72611076501

## 2021-02-06 NOTE — Telephone Encounter
ED Discharge Follow Up  Reached patient: No; currently at [facility] ED 02/06/21  Admission Information  Hospital Name : [facility]  ED Admission Date: 02/04/21 ED Discharge Date: 02/04/21 Admission Diagnosis: wrist injury  Discharge Diagnosis : Left without being seen  Hospital Services: Unplanned  Today's call is 2 (calendar) days post discharge      Medication Reconciliation  Changes to pre-ED visit medications? No  Were new prescriptions filled?N/A  Meds reviewed and reconciled? Yes  ? acetaminophen (TYLENOL) 325 mg tablet Take 325 mg by mouth every 6 hours as needed for Pain.   ? ARIPiprazole (ABILIFY) 10 mg tablet TAKE 1/2 TABLET BY MOUTH DAILY   ? atorvastatin (LIPITOR) 10 mg tablet Take one tablet by mouth daily.   ? cyclobenzaprine (FLEXERIL) 10 mg tablet Take one tablet by mouth three times daily as needed for Muscle Cramps.   ? nicotine (NICODERM CQ STEP 1) 21 mg/day patch Apply one patch to top of skin as directed daily. Rotate patch location.  Indications: stop smoking   ? sildenafiL (VIAGRA) 50 mg tablet TAKE 1 TABLET BY MOUTH EVERY EVENING   ? TRIUMEQ 600-50-300 mg tablet Take 1 Tablet by mouth once daily   ? valACYclovir (VALTREX) 1 gram tablet Take 2 Tablets by mouth every 12 (twelve) hours for 4 days for outbreak   ? vitamins, multiple cap Take 1 capsule by mouth daily.         Scheduling Follow-up Appointment  Upcoming appointment date and time and with whom scheduled:   Future Appointments   Date Time Provider Department Center   05/18/2021  9:00 AM Rada Hay, APRN-NP [facility]     When was patient?s last PCP visit: Visit date not found  PCP primary location: [facility]  PCP appointment scheduled? No, routine appt 05/18/21  Specialist appointment scheduled? No  Both PCP and Specialist appointment scheduled: No  Is assistance with transportation needed? No  MyChart message sent? Active in MyChart. No message sent.     ED Communication   Did Pt call Clinic prior to going to ED? No      Lavell Islam

## 2021-02-06 NOTE — Consults
[facility] Consult  02/05/2021       Admission date 02/05/2021, LOS: 0 days  Patient: Charles Walls  MRN: [id number]    Admission Date:  02/05/2021, LOS: 0 days  Admission Diagnosis: No admission diagnoses are documented for this encounter.  Date of Service: February 05, 2021    Reason for Consult: Wrist abscess  Referring Provider: Lavella Lemons, DO  Attending Surgeon: Dr. Lindley Magnus  Consult Performed by: Casandra Doffing, MD    ASSESSMENT: 51 y.o. male with 3.5 cm L dorsal wrist abscess    PLAN:  - Bedside I&D, see bottom of note for brief procedure note.   - Recommend medicine admission for IV antibiotics. Started zosyn, will ultimately defer antibiotic choice to medicine. Culture taken prior to administration of antibiotics during procedure.   - Please keep L wrist elevated - may utilize Carter pillow - at all times to decrease erythema.   - OT consult placed for resting thermoplastic intrinsic plus splint to preserve hand mobility. Patient does not need to wear during daytime, may perform ROM as tolerated.     Discussed plan of care with staff surgeon, Dr. Lindley Magnus, who directed plan of care.  __________________________________________________________________________________    HPI: SEVERUS BRODZINSKI is a 51 y.o. 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 Noticed swelling and pain yesterday and went to ED, however went home after 3.5 hour wait. Represents today after pain worsened. Last tetanus shot was less than ten years. Denies history of hand or upper extremity trauma or surgery.     Denies history of vascular disease including HTN, DM, but does smoke a little less than one pack per day.     Medical History:   Diagnosis Date   ? Diverticulitis 02/02/2017   ? Fracture cervical vertebra-closed (HCC)     c6, c7 - no surgical repair   ? Gastroesophageal reflux disease without esophagitis 10/05/2019    Ordered omeprazole 40mg  qday before breakfast.  Discussed lifestyle modifications. No current red flag symptoms.  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 Human immunodeficiency virus (HIV) disease (HCC)    ? Hyperlipidemia 06/13/2013   ? Psoriasis, unspecified 02/21/2014   ? Sepsis (HCC) 03/20/2019   ? Suicide attempt [facility])      Surgical History:   Procedure Laterality Date   ? COLONOSCOPY N/A 06/17/2015    Performed by Jolee Ewing, MD at [facility] ENDO   ? ROBOT ASSISTED LAPAROSCOPIC NEPHRECTOMY PARTIAL WITH INTRAOPERATIVE ULTRASOUND  Left 06/09/2017    Performed by Daryl Eastern, MD at [facility] OR   ? BIOPSY/ EXCISION LYMPH NODE DEEP CERVICAL Left 08/11/2020    Performed by Alpha Gula, MD at [facility] OR   ? HERNIA REPAIR     ? HX APPENDECTOMY     ? PR LAPAROSCOPY SURG RPR INITIAL INGUINAL HERNIA      right     Medications:  No current facility-administered medications on file prior to encounter.     Current Outpatient Medications on File Prior to Encounter   Medication Sig Dispense Refill   ? acetaminophen (TYLENOL) 325 mg tablet Take 325 mg by mouth every 6 hours as needed for Pain.     ? ARIPiprazole (ABILIFY) 10 mg tablet TAKE 1/2 TABLET BY MOUTH DAILY 45 tablet 0   ? atorvastatin (LIPITOR) 10 mg tablet Take one tablet by mouth daily. 90 tablet 1   ? cyclobenzaprine (FLEXERIL) 10  mg tablet Take one tablet by mouth three times daily as needed for Muscle Cramps. 15 tablet 0   ? nicotine (NICODERM CQ STEP 1) 21 mg/day patch Apply one patch to top of skin as directed daily. Rotate patch location.  Indications: stop smoking 28 patch 0   ? sildenafiL (VIAGRA) 50 mg tablet TAKE 1 TABLET BY MOUTH EVERY EVENING 10 tablet 2   ? TRIUMEQ 600-50-300 mg tablet Take 1 Tablet by mouth once daily 30 tablet 3   ? valACYclovir (VALTREX) 1 gram tablet Take 2 Tablets by mouth every 12 (twelve) hours for 4 days for outbreak 16 tablet 0   ? vitamins, multiple cap Take 1 capsule by mouth daily.       Allergies:  Sulfa (sulfonamide antibiotics)    Social History     Socioeconomic History   ? Marital status: Divorced   Occupational History   ? Occupation: unemployed   Tobacco Use   ? Smoking status: Every Day     Packs/day: 1.50     Years: 36.00     Pack years: 54.00     Types: Cigarettes     Start date: 78   ? Smokeless tobacco: Never   Vaping Use   ? Vaping Use: Former   Substance and Sexual Activity   ? 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   ? Sexual activity: Yes     Partners: Male   Social History Narrative    Pagan     Family History   Problem Relation Age of Onset   ? Melanoma Father    ? Cancer Father         oral mandibular   ? High Cholesterol Mother    ? Cancer-Colon Maternal Aunt 70   ? Diabetes Neg Hx    ? Hypertension Neg Hx    ? Cancer-Breast Neg Hx    ? Cancer-Ovarian Neg Hx    ? Cancer-Prostate Neg Hx      Vitals:  Vital Signs: Last Filed In 24 Hours Vital Signs: 24 Hour Range   BP: 123/86 (01/19 1624)  Temp: 36.7 ?C (98.1 ?F) (01/19 1346)  Pulse: 104 (01/19 1346)  Respirations: 17 PER MINUTE (01/19 1346)  SpO2: 98 % (01/19 1625)  O2 Device: None (Room air) (01/19 1346)  SpO2 Pulse: 97 (01/19 1625)  Height: 162.6 cm (5' 4) (01/19 1346) BP: (123-134)/(75-86)   Temp:  [36.7 ?C (98.1 ?F)]   Pulse:  [104]   Respirations:  [17 PER MINUTE]   SpO2:  [96 %-100 %]   O2 Device: None (Room air)   Intensity Pain Scale (Self Report): 7 (02/05/21 1347)      Intake/Output:  No intake or output data in the 24 hours ending 02/05/21 1815    Review of Systems:  Constitutional: Negative for fever, chills. HENT: Negative for hearing loss, rhinorrhea.  Eyes: Negative for pain. Respiratory: Negative for cough, shortness of breath.   Cardiovascular: Negative for chest pain and palpitations. Gastrointestinal: Negative for abdominal distention, pain, nausea, vomiting, Genitourinary: Negative for difficulty urinating. Musculoskeletal: Negative for myalgias. Skin: Negative for rash. Neurological: Negative for dizziness. Hematological: Does not bruise/bleed easily. Psychiatric/Behavioral: Negative    Physical Exam:  Blood pressure 123/86, pulse 104, temperature 36.7 ?C (98.1 ?  F), height 162.6 cm (5' 4), weight 70.3 kg (155 lb), SpO2 98 %.  -General: Alert, conversant. Appears comfortable.  -Eyes: Vision grossly intact.   -Head/Ears/Nose/Throat: Atraumatic.  -Neck: Supple  -Respiratory: Non-labored respirations.   -CardioVascular: Extremities appear well perfused.   -Gastrointestinal: Non-distended  -Lymphatic: No visible lymphedema  -Skin: Skin warm.  -Psychiatric: Appropriate mood and affect.     Hand Exam  General: See image below.   Skin: Edematous, erythematous L dorsal hand and distal forearm. Small punctate wound on radial aspect of dorsal distal forearm on L.  Tendons: Patient unable to make fist and straighten fingers of L hand 2/2 pain.  Nerves: Median, ulnar, and radial nerve motor and sensory function grossly intact.     Lab/Radiology/Other Diagnostic Tests:  Recent Labs     02/05/21  1506   HGB 14.4   HCT 43.8   WBC 12.0*   PLTCT 536*   NA 138   K 4.3   CL 102   CO2 27   BUN 18   CR 1.05   GLU 86   CA 9.8   ALBUMIN 4.4   TOTPROT 8.0   TOTBILI 0.3   AST 14   ALT 14   ALKPHOS 85     Glucose: 86 (02/05/21 1506)      Pertinent Imaging:  WRIST COMP MIN 3 VIEWS LEFT    Result Date: 02/05/2021  Exam: WRIST COMP MIN 3 VIEWS LEFT CLINICAL INDICATION: 50 years wrist injury. COMPARISON: None.     1.  No acute osseous abnormality. Normal alignment. 2.  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 3.  Soft tissue swelling at the radial aspect of the distal forearm which is nonspecific and may reflect contusion.  Finalized by Claudina Lick, M.D. on 02/05/2021 1:57 PM. Dictated by Claudina Lick, M.D. on 02/05/2021 1:55 PM.     US DOPPLER VENOUS LEFT    Result Date: 02/05/2021  LEFT UPPER EXTREMITY VENOUS DOPPLER ULTRASOUND CLINICAL INDICATION: Left arm pain and swelling. History of HIV. Evaluate for possible fluid collection or abscess. TECHNIQUE: Multiple grayscale, color Doppler and spectral Doppler ultrasound images were obtained of the left upper extremity for evaluation of peripheral veins. COMPARISON: Same day wrist radiograph. FINDINGS: The left internal jugular vein is patent and fully compressible, with normal transmitted cardiac pulsatility. The innominate and subclavian veins are patent without luminal thrombus. The axillary and brachial veins are patent and fully compressible without filling defect.  The basilic vein is patent and fully compressible. There is incompletely occlusive superficial thrombus throughout the cephalic vein from the high upper arm to the level of the wrist. There is significant subcutaneous edema along the posterior aspect of the wrist at the patient's area of concern, with a complex fluid collection measuring 3.5 x 1.7 x 3.0 cm with robust surrounding hyperemia and thrombosis of an adjacent superficial vein. The patient fell and palpable area of concern along the lateral aspect of the left wrist reveals an approximately 1.2 cm area of heterogeneity which may represent edema/phlegmon. Mildly enlarged reactive appearing axillary lymph nodes, measuring up to 1.5 cm along the short axis. No morphologically suspicious lymph nodes.     1.  Complex fluid collection in the subcutaneous tissues of the posterior aspect of the left wrist measures up to 3.5 cm. There is surrounding hyperemia, consistent with abscess. 2.  Severe diffuse edema of the posterior aspect of the wrist. 3.  The left arm is negative for DVT. 4.  Nonocclusive superficial thrombus in the left cephalic vein extending from the high upper arm to the level of the wrist. Dr. Ileene Musa discussed the findings with NP Alan Mulder by telephone at 4:51 PM on 02/05/2021.  Approved by Milus Height, MD on 02/05/2021 4:58 PM By my electronic signature, I attest that I have personally reviewed the images for this examination and formulated the interpretations and opinions expressed in this report  Finalized by Kathyrn Lass, M.D. on 02/05/2021 5:00 PM. Dictated by Milus Height, MD on 02/05/2021 4:26 PM.       Casandra Doffing, MD    Bedside Procedure Note    Patient: FRANKE OLT     DOB: 06-05-1970     MRN: [id number]    Date of Procedure: 02/05/21    Diagnosis  1. L dorsal wrist abscess    Procedure  1. Bedside I&D    Indication for Procedure  [name] presented with L dorsal wrist abscess. We offered bedside drainage and washout for treatment. We discussed the benefits of the aforementioned procedure. Risks were also discussed in detail as well as expected course including iv antibiotics, twice daily soaks, and inpatient admission. The patient demonstrated understanding of the conversation and elected to proceed.     Anesthesia Type: Local anesthesia with 10 mL buffered lidocaine 1%     Description and Findings of Bedside Procedure   Verbal consent was obtained. We began by performing a time-out to ensure the correct patient, procedure, and location and all members of the team agreed. 10 mL of local anesthetic (lidocaine 1%) was injected. After sufficient time for anesthesia, we began by doing a vertical incision over the central aspect of the abscess, with immediate release of purulent material. A culture swab was taken. The area was then further opened with blunt dissection using a kelly. Two more purulent pockets were identified, resulting in copious drainage. Then, the area was thoroughly irrigating the wound with 1000 mL of sterile saline irrigation  Following thorough irrigation, the area was packed with plain packing strips (1 piece), and wrapped in kerlix and a gentle ace wrap. The patient was tolerated the procedure well without apparent issue.    Casandra Doffing, MD  Team Pager: [phone]

## 2021-02-06 NOTE — H&P (View-Only)
Admission History and Physical Examination      Name:  Charles Walls                                             MRN:  [id number]   Admission Date:  02/05/2021    Patient information                      Problem list    Principal Problem:    Abscess of skin of left wrist  Active Problems:    HIV (human immunodeficiency virus infection) (HCC)    Hyperlipidemia    Tobacco abuse    Alcohol use disorder, moderate, in sustained remission (HCC)    Methamphetamine use disorder, mild (HCC)    Immunodeficiency, unspecified (HCC)      Charles Walls is a 51 y.o. male presenting with L wrist pain who is admitted from the ED for Abscess of skin of left wrist.      PMHx:  has a past medical history of Diverticulitis (02/02/2017), Fracture cervical vertebra-closed (HCC), Gastroesophageal reflux disease without esophagitis (10/05/2019), Human immunodeficiency virus (HIV) disease (HCC), Hyperlipidemia (06/13/2013), Psoriasis, unspecified (02/21/2014), Sepsis (HCC) (03/20/2019), and Suicide attempt (HCC).    Allergies: Sulfa (sulfonamide antibiotics)    Assessment and Plan     #L wrist abscess s/p I&D  #HIV, undectectible  #Hx of IVDU  -Patient reports possible trauma to left wrist approximately 3 days PTA (02/02/2021)  -Patient also reports previous history of ganglion cyst in the area  -Has had HIV for approximately 22 years, reports he is currently undetectable and compliant with ART therapy  -Has history of IV drug use and reports injection site was near site of abscess, last use approximately 1 year ago; only used meth, no other illicit drug use  -Last use of meth approximately 3 days ago by smoking  -Meets SIRS criteria 2/4 (leukocytosis, tachycardia), patient has been afebrile in ED  -XR of the left wrist shows no acute fracture and normal alignment, degenerative arthritis, and soft tissue swelling at radial aspect of distal forearm  -Ultrasound of L wrist shows complex subcutaneous fluid collection of left wrist up to 3.5 cm with surrounding hyperemia most consistent with abscess, severe diffuse edema, no DVT, nonocclusive superficial thrombus left cephalic vein from high upper arm to left wrist  -S/p I&D with plastic surgery in ED, swabs collected for culture  -In ED, blood cultures x2, UA negative for acute infection  -S/p 1 dose of Zosyn  Plan:  > Plastic surgery following, recs pending for a.m.  > OT consulted per plastic surgery  > Continue patient in splint  > CTM wound and blood cultures  > Repeat CD4 count pending, Will continue ART while inpt with formulary equivalent  > Continue zosyn 4.5g q8hrs for empiric coverage  > Continue ART formulary equivalent: abacavir 600mg  daily, dolutegravir 50mg  daily, lamivudine 300mg    > Schedule tylenol 650 q4hrs for pain, consider morphine 2mg  q4hrs for breakthrough    #HLD  -Last lipid panel 07/03/20  -PTA regimen includes Atorvastatin 10mg   -Calculated ASCVD risk is 6.5%  Plan:  > Repeat lipid panel ordered  > Continue PTA regimen as above    #Tobacco use  > Consider nicotine patch at pt request    Hospital Issues     FEN  IVF: none  Monitor and  replace electrolytes as needed  Diet: Regular    Access  Lines: PIV x2  Urinary Catheter: None    DVT Prophylaxis  Mechanical: Sequential compression device  Pharmacological: Enoxaparin  GI Prophylaxis:  Indication: Not indicated by absence of high risk feature  Medications: Prophylactic pharmacotherapy not indicated    PT/OT: Ordered, appreciate recommendations  Wounds: As noted above    Code Status:  Full Code    Disposition: Admit patient to family medicine service for management of abscess of L wrist.    This patient was discussed with attending physician Dr. Raford Pitcher.    Derrill Center, MD  [facility]  Department of [facility] [employer] [facility], PGY-1  Team Pager [phone]  On [place].    Subjective     Primary Care Physician: Theodoro Grist  Verified    Chief Complaint:  Wrist pain and redness  History of Present Illness: Charles Walls is a 50 y.o. male    He presented to the ED with acute right wrist pain, swelling, and redness extending up the forearm for 2-3 days. He noted that the there had been a knot in the wrist for several months (was told it was a ganglion cyst) but acute pain started on 1/16 after it was pinned to the ground while wrestling. He denied known fever, chills and diaphoresis.  On exam in the ED prior to I&D (per documentation), he had erythema extending up the forearm, warmth, and swelling of the dorsal L wrist with fluctuant and tender 4 cm x 5 cm lesion appearing to be an abscess. He was afebrile and tachycardic at 109 bpm.  WBC's were 12, CRP 9.58, and ESR 75. Lactate was normal. US of the left wrist showed a complex fluid collection in the subcutaneous tissues of the posterior aspect of the left wrist measures up to 3.5 cm consistent with abscess. Left wrist x-ray was normal except for soft tissue swelling. The abscess was drained by plastic surgery and he was started on IV Zosyn. The patient reported that the wrist felt much better after the drainage. He had no pain after morphine and Dilaudid in the ER. Of note, he does have a history of abscess of the right groin in 2019 and abscess/cellulitis of the left leg due to injection drug use 6/22. He has a history of IV drug use of crystal meth, last use was >2 months ago. He is compliant with ART x22y and states he is undetectable.    ED course:   - Vitals: Tachycardia at 104   - Labs: WBC 12.0, ESR 75, CRP 9.58, UA with 1+ blood   - Imaging:       -L wrist XR: No acute osseous abnormality, normal alignment, soft tissue swelling.      L wrist [place]: fluid collectoin in subQ of dorsal left wrist up to 3.5cm consistent with abscess. Severe diffuse edema of posterior aspect of wrist. No DVT. Nonocclusive superficial thrombus in L cephalic vein from high upper arm to wrist.   - Medication: Fentanyl x1, dilaudid 1mg  x1, morphine 4mg  x1 and 8mg  x1, zosyn 4.5g x1, LR 1L bolus    Objective     Physical Exam:  Vital Signs: Last Filed In 24 Hours Vital Signs: 24 Hour Range   BP: 124/82 (01/19 1900)  Temp: 36.7 ?C (98.1 ?F) (01/19 1346)  Pulse: 104 (01/19 1346)  Respirations: 17 PER MINUTE (01/19 1346)  SpO2: 95 % (01/19 1920)  O2  Device: None (Room air) (01/19 1346)  SpO2 Pulse: 98 (01/19 1920)  Height: 162.6 cm (5' 4) (01/19 1346) BP: (123-134)/(75-86)   Temp:  [36.7 ?C (98.1 ?F)]   Pulse:  [104]   Respirations:  [17 PER MINUTE]   SpO2:  [95 %-100 %]   O2 Device: None (Room air)   Intensity Pain Scale (Self Report): 7 (02/05/21 1347)      Physical Exam  Vitals and nursing note reviewed.   Constitutional:       Appearance: Normal appearance. He is normal weight.   HENT:      Head: Normocephalic and atraumatic.      Right Ear: External ear normal.      Left Ear: External ear normal.      Nose: Nose normal.      Mouth/Throat:      Mouth: Mucous membranes are moist.   Eyes:      General: No scleral icterus.        Right eye: No discharge.         Left eye: No discharge.   Cardiovascular:      Rate and Rhythm: Normal rate and regular rhythm.      Pulses: Normal pulses.      Heart sounds: Normal heart sounds. No murmur heard.    No gallop.      Comments: No murmur heard on exam  Pulmonary:      Effort: Pulmonary effort is normal. No respiratory distress.      Breath sounds: Normal breath sounds. No wheezing, rhonchi or rales.   Abdominal:      General: Abdomen is flat. Bowel sounds are normal. There is no distension.      Palpations: Abdomen is soft.      Tenderness: There is no abdominal tenderness. There is no guarding or rebound.   Musculoskeletal:      Cervical back: Neck supple.      Comments: L wrist with splint in place following bedside I&D, unable to be examined   Skin:     Findings: Lesion present.      Comments: Known lesion unable to be examined due to splint in place. Some edema noted of L hand and proximal end of digits. No streaking or erythema noted on L arm proximal to splint.   Neurological:      General: No focal deficit present.      Mental Status: He is alert and oriented to person, place, and time. Mental status is at baseline.   Psychiatric:         Mood and Affect: Mood normal.         Behavior: Behavior normal.     Lab/Radiology/Other Diagnostic Tests:  24-hour labs:    Results for orders placed or performed during the hospital encounter of 02/05/21 (from the past 24 hour(s))   POC LACTATE    Collection Time: 02/05/21  3:05 PM   Result Value Ref Range    LACTIC ACID POC 0.8 0.5 - 2.0 MMOL/L   CBC AND DIFF    Collection Time: 02/05/21  3:06 PM   Result Value Ref Range    White Blood Cells 12.0 (H) 4.5 - 11.0 K/UL    RBC 4.64 4.4 - 5.5 M/UL    Hemoglobin 14.4 13.5 - 16.5 GM/DL    Hematocrit 16.1 40 - 50 %    MCV 94.3 80 - 100 FL    MCH 31.1 26 - 34 PG    MCHC 33.0 32.0 -  36.0 G/DL    RDW 16.1 11 - 15 %    Platelet Count 536 (H) 150 - 400 K/UL    MPV 7.5 7 - 11 FL    Neutrophils 72 41 - 77 %    Lymphocytes 15 (L) 24 - 44 %    Monocytes 12 4 - 12 %    Eosinophils 1 0 - 5 %    Basophils 0 0 - 2 %    Absolute Neutrophil Count 8.60 (H) 1.8 - 7.0 K/UL    Absolute Lymph Count 1.84 1.0 - 4.8 K/UL    Absolute Monocyte Count 1.44 (H) 0 - 0.80 K/UL    Absolute Eosinophil Count 0.11 0 - 0.45 K/UL    Absolute Basophil Count 0.03 0 - 0.20 K/UL   COMPREHENSIVE METABOLIC PANEL    Collection Time: 02/05/21  3:06 PM   Result Value Ref Range    Sodium 138 137 - 147 MMOL/L    Potassium 4.3 3.5 - 5.1 MMOL/L    Chloride 102 98 - 110 MMOL/L    Glucose 86 70 - 100 MG/DL    Blood Urea Nitrogen 18 7 - 25 MG/DL    Creatinine 0.96 0.4 - 1.24 MG/DL    Calcium 9.8 8.5 - 04.5 MG/DL    Total Protein 8.0 6.0 - 8.0 G/DL    Total Bilirubin 0.3 0.3 - 1.2 MG/DL    Albumin 4.4 3.5 - 5.0 G/DL    Alk Phosphatase 85 25 - 110 U/L    AST (SGOT) 14 7 - 40 U/L    CO2 27 21 - 30 MMOL/L    ALT (SGPT) 14 7 - 56 U/L    Anion Gap 9 3 - 12    eGFR >60 >60 mL/min   C REACTIVE PROTEIN (CRP)    Collection Time: 02/05/21  3:06 PM   Result Value Ref Range    C-Reactive Protein 9.58 (H) <1.0 MG/DL   SED RATE    Collection Time: 02/05/21  3:06 PM   Result Value Ref Range    Sed Rate -ESR 75 (H) 0 - 20 MM/HR   URINALYSIS DIPSTICK REFLEX TO CULTURE    Collection Time: 02/05/21  4:41 PM    Specimen: Urine   Result Value Ref Range    Color,UA YELLOW     Turbidity,UA 1+ (A) CLEAR-CLEAR    Specific Gravity-Urine 1.023 1.005 - 1.030    pH,UA 6.0 5.0 - 8.0    Protein,UA NEG NEG-NEG    Glucose,UA NEG NEG-NEG    Ketones,UA NEG NEG-NEG    Bilirubin,UA NEG NEG-NEG    Blood,UA 1+ (A) NEG-NEG    Urobilinogen,UA NORMAL NORM-NORMAL    Nitrite,UA NEG NEG-NEG    Leukocytes,UA NEG NEG-NEG    Urine Ascorbic Acid, UA NEG NEG-NEG   URINALYSIS MICROSCOPIC REFLEX TO CULTURE    Collection Time: 02/05/21  4:41 PM    Specimen: Urine   Result Value Ref Range    WBCs,UA 0-2 0 - 2 /HPF    RBCs,UA 0-2 0 - 3 /HPF    Comment,UA       Criteria for reflex to culture are WBC>10, Positive Nitrite, and/or >=+1   leukocytes. If quantity is not sufficient, an addendum will follow.      MucousUA TRACE      Glucose: 86 (02/05/21 1506)    US DOPPLER VENOUS LEFT   Final Result         1.  Complex fluid collection in the subcutaneous  tissues of the posterior aspect of the left wrist measures up to 3.5 cm. There is surrounding hyperemia, consistent with abscess.   2.  Severe diffuse edema of the posterior aspect of the wrist.   3.  The left arm is negative for DVT.   4.  Nonocclusive superficial thrombus in the left cephalic vein extending from the high upper arm to the level of the wrist.      Dr. Ileene Musa discussed the findings with NP Alan Mulder by telephone at 4:51 PM on 02/05/2021.                Approved by Milus Height, MD on 02/05/2021 4:58 PM      By my electronic signature, I attest that I have personally reviewed the images for this examination and formulated the interpretations and opinions expressed in this report          Finalized by Kathyrn Lass, M.D. on 02/05/2021 5:00 PM. Dictated by Milus Height, MD on 02/05/2021 4:26 PM.         WRIST COMP MIN 3 VIEWS LEFT   Final Result         1.  No acute osseous abnormality. Normal alignment.      2.  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      3.  Soft tissue swelling at the radial aspect of the distal forearm which is nonspecific and may reflect contusion.                Finalized by Claudina Lick, M.D. on 02/05/2021 1:57 PM. Dictated by Claudina Lick, M.D. on 02/05/2021 1:55 PM.             Derrill Center, MD  Family medicine PGY-1

## 2021-02-07 MED ADMIN — ACETAMINOPHEN 325 MG PO TAB [101]: 650 mg | ORAL | @ 13:00:00 | NDC 00904677361

## 2021-02-07 MED ADMIN — SODIUM CHLORIDE 0.9 % IV PGBK (MB+) [95161]: 4.5 g | INTRAVENOUS | @ 09:00:00 | Stop: 2021-02-07 | NDC 00338915930

## 2021-02-07 MED ADMIN — CEFTRIAXONE INJ 2GM IVP [210254]: 2 g | INTRAVENOUS | @ 15:00:00 | NDC 00781320990

## 2021-02-07 MED ADMIN — DEXTROSE 5% IN WATER IV SOLP [2364]: 1000 mg | INTRAVENOUS | @ 02:00:00 | NDC 00338001702

## 2021-02-07 MED ADMIN — WATER FOR INJECTION, STERILE IJ SOLN [79513]: 20 mL | INTRAVENOUS | @ 15:00:00 | Stop: 2021-02-07 | NDC 63323018508

## 2021-02-07 MED ADMIN — PIPERACILLIN-TAZOBACTAM 4.5 GRAM IV SOLR [80419]: 4.5 g | INTRAVENOUS | @ 09:00:00 | Stop: 2021-02-07 | NDC 60505615900

## 2021-02-07 MED ADMIN — ENOXAPARIN 80 MG/0.8 ML SC SYRG [85050]: 70 mg | SUBCUTANEOUS | @ 17:00:00 | NDC 00781326204

## 2021-02-07 MED ADMIN — ONDANSETRON 4 MG PO TBDI [82394]: 4 mg | ORAL | @ 19:00:00 | NDC 68462015740

## 2021-02-07 MED ADMIN — SODIUM CHLORIDE 0.9 % IV PGBK (MB+) [95161]: 4.5 g | INTRAVENOUS | @ 02:00:00 | NDC 00338915930

## 2021-02-07 MED ADMIN — PIPERACILLIN-TAZOBACTAM 4.5 GRAM IV SOLR [80419]: 4.5 g | INTRAVENOUS | @ 02:00:00 | NDC 60505615900

## 2021-02-07 MED ADMIN — PENICILLIN G BENZATHINE 1,200,000 UNIT/2 ML IM SYRG [86512]: 2.4 10*6.[IU] | INTRAMUSCULAR | Stop: 2021-02-07 | NDC 60793070102

## 2021-02-07 MED ADMIN — VANCOMYCIN 1,000 MG IV SOLR [8442]: 1000 mg | INTRAVENOUS | @ 02:00:00 | NDC 72611076501

## 2021-02-08 MED ADMIN — WATER FOR INJECTION, STERILE IJ SOLN [79513]: 20 mL | INTRAVENOUS | @ 16:00:00 | Stop: 2021-02-08 | NDC 63323018508

## 2021-02-08 MED ADMIN — CEFTRIAXONE INJ 2GM IVP [210254]: 2 g | INTRAVENOUS | @ 16:00:00 | NDC 00781320990

## 2021-02-08 MED ADMIN — CEFTRIAXONE INJ 2GM IVP [210254]: 2 g | INTRAVENOUS | @ 02:00:00 | NDC 00781320990

## 2021-02-08 MED ADMIN — ENOXAPARIN 80 MG/0.8 ML SC SYRG [85050]: 70 mg | SUBCUTANEOUS | @ 16:00:00 | NDC 00781326204

## 2021-02-08 MED ADMIN — ENOXAPARIN 80 MG/0.8 ML SC SYRG [85050]: 70 mg | SUBCUTANEOUS | @ 02:00:00 | NDC 00781326204

## 2021-02-08 MED ADMIN — LINEZOLID 600 MG PO TAB [80084]: 600 mg | ORAL | @ 16:00:00 | NDC 00904655304

## 2021-02-08 MED ADMIN — ACETAMINOPHEN 325 MG PO TAB [101]: 650 mg | ORAL | @ 02:00:00 | NDC 00904677361

## 2021-02-08 MED ADMIN — LINEZOLID 600 MG PO TAB [80084]: 600 mg | ORAL | @ 02:00:00 | NDC 00904655304

## 2021-02-08 MED ADMIN — ONDANSETRON 4 MG PO TBDI [82394]: 4 mg | ORAL | @ 16:00:00 | NDC 68462015740

## 2021-02-09 ENCOUNTER — Encounter: Admit: 2021-02-09 | Discharge: 2021-02-09 | Payer: No Typology Code available for payment source

## 2021-02-09 ENCOUNTER — Inpatient Hospital Stay: Admit: 2021-02-09 | Discharge: 2021-02-09 | Payer: No Typology Code available for payment source

## 2021-02-09 MED ADMIN — CEFTRIAXONE INJ 2GM IVP [210254]: 2 g | INTRAVENOUS | @ 02:00:00 | NDC 00781320990

## 2021-02-09 MED ADMIN — ENOXAPARIN 80 MG/0.8 ML SC SYRG [85050]: 70 mg | SUBCUTANEOUS | @ 02:00:00 | NDC 00781326204

## 2021-02-09 MED ADMIN — LINEZOLID 600 MG PO TAB [80084]: 600 mg | ORAL | @ 14:00:00 | NDC 00904655304

## 2021-02-09 MED ADMIN — SODIUM CHLORIDE 0.9 % IJ SOLN [7319]: 50 mL | INTRAVENOUS | @ 15:00:00 | Stop: 2021-02-09 | NDC 00409488850

## 2021-02-09 MED ADMIN — ACETAMINOPHEN 325 MG PO TAB [101]: 650 mg | ORAL | @ 04:00:00 | NDC 00904677361

## 2021-02-09 MED ADMIN — IOHEXOL 350 MG IODINE/ML IV SOLN [81210]: 80 mL | INTRAVENOUS | @ 15:00:00 | Stop: 2021-02-09 | NDC 00407141491

## 2021-02-09 MED ADMIN — LINEZOLID 600 MG PO TAB [80084]: 600 mg | ORAL | @ 02:00:00 | NDC 00904655304

## 2021-02-09 MED ADMIN — CEFTRIAXONE INJ 2GM IVP [210254]: 2 g | INTRAVENOUS | @ 14:00:00 | NDC 00781320990

## 2021-02-09 MED ADMIN — ENOXAPARIN 80 MG/0.8 ML SC SYRG [85050]: 70 mg | SUBCUTANEOUS | @ 14:00:00 | Stop: 2021-02-09 | NDC 00781326204

## 2021-02-10 ENCOUNTER — Encounter: Admit: 2021-02-10 | Discharge: 2021-02-10 | Payer: No Typology Code available for payment source

## 2021-02-10 MED ADMIN — CEFTRIAXONE INJ 2GM IVP [210254]: 2 g | INTRAVENOUS | @ 02:00:00 | NDC 00781320990

## 2021-02-10 MED ADMIN — APIXABAN 5 MG PO TAB [315778]: 10 mg | ORAL | @ 14:00:00 | Stop: 2021-02-10 | NDC 00003089431

## 2021-02-10 MED ADMIN — LINEZOLID 600 MG PO TAB [80084]: 600 mg | ORAL | @ 03:00:00 | NDC 00904655304

## 2021-02-10 MED ADMIN — CEPHALEXIN 500 MG PO CAP [9500]: 500 mg | ORAL | @ 14:00:00 | Stop: 2021-02-10 | NDC 50268015211

## 2021-02-10 MED ADMIN — APIXABAN 5 MG PO TAB [315778]: 10 mg | ORAL | @ 02:00:00 | Stop: 2021-02-16 | NDC 00003089431

## 2021-02-10 NOTE — Telephone Encounter
Patient discharged on: 02/10/21    Antibiotic per Dr. Mayra Reel  -- Keflex 500mg  QID PO x 10 days total    Called patient and he knows to start abx and it is being couriered to him currently per patient.  He will call with any issues.    Follow up with ID: in 10-14 days.     Labs: HIV VL with appt

## 2021-02-11 ENCOUNTER — Encounter: Admit: 2021-02-11 | Discharge: 2021-02-11 | Payer: No Typology Code available for payment source

## 2021-02-11 NOTE — Telephone Encounter
Hospital Discharge Follow Up      Reached Patient:Yes  Patient Date of Birth: March 02, 1970     Admission Information:     Hospital Name: [facility]  Admission Date: 02/05/21  Discharge Date: 02/10/21    Admission Diagnosis: Abscess of skin of left wrist  Discharge Diagnosis: Abscess of skin of left wrist  Has there been a discharge within the last 30 days? No   Hospital Services: Unplanned  Today's call is 1(business) days post discharge      Discharge Instruction Review   Did patient receive and understand discharge instructions? Yes    Home Health ordered? No                 Agency name/telephone number: NA   Has Home Health agency contacted patient? No   Caregiver assistance in the home? No   Are there concerns regarding the patient's ADL'S? No  Is patient a fall risk? No    Special diet? No If yes, type: reg      Medication Reconciliation    Changes to pre-hospital medications? Yes     START taking:  apixaban (ELIQUIS)  cephalexin (KEFLEX)  nicotine (NICODERM CQ)    Were new prescriptions filled?Yes  Meds reviewed and reconciled?Yes  ? acetaminophen (TYLENOL) 325 mg tablet Take 325 mg by mouth every 6 hours as needed for Pain.   ? apixaban (ELIQUIS) 5 mg tablet Take 10 mg (2 tablets) by mouth twice daily for 6 days, then take 5 mg (1 tablet) twice daily  Indications: extensive superficial thrombus of left cephalic vein   ? ARIPiprazole (ABILIFY) 10 mg tablet TAKE 1/2 TABLET BY MOUTH DAILY   ? atorvastatin (LIPITOR) 10 mg tablet Take one tablet by mouth daily.   ? cephalexin (KEFLEX) 500 mg capsule Take one capsule by mouth four times daily for 39 doses. Indications: skin and skin structure infection   ? nicotine (NICODERM CQ) 21 mg/day patch Apply one patch to top of skin as directed daily. Rotate patch location.  Indications: stop smoking   ? sildenafiL (VIAGRA) 50 mg tablet TAKE 1 TABLET BY MOUTH EVERY EVENING (Patient taking differently: Take 50 mg by mouth as Needed.)   ? TRIUMEQ 600-50-300 mg tablet Take 1 Tablet by mouth once daily   ? valACYclovir (VALTREX) 1 gram tablet Take 2 Tablets by mouth every 12 (twelve) hours for 4 days for outbreak   ? vitamins, multiple cap Take 1 capsule by mouth daily.         Understanding Condition   Having any current symptoms? No  Patient understands when to seek additional medical care? Yes   Other instructions provided : Follow up with PCP with any concerns      Scheduling Follow-up Appointment   Upcoming appointment date and time and with whom scheduled:   Future Appointments   Date Time Provider Department Center   05/18/2021  9:00 AM Rada Hay, APRN-NP [facility]     PCP appointment scheduled?Yes, Date: 05/18/21  Declined sooner visit  PCP primary location: [facility]  Specialist appointment scheduled? No  Both PCP and Specialist appointment scheduled: No  Is assistance with transportation needed?No   MyChart message sent? Active in MyChart. No message sent.     Olegario Shearer, RN

## 2021-02-20 ENCOUNTER — Ambulatory Visit: Admit: 2021-02-20 | Discharge: 2021-02-20 | Payer: No Typology Code available for payment source

## 2021-02-20 ENCOUNTER — Encounter: Admit: 2021-02-20 | Discharge: 2021-02-20 | Payer: No Typology Code available for payment source

## 2021-02-20 VITALS — BP 118/71 | HR 101 | Ht 64.0 in | Wt 149.0 lb

## 2021-02-20 DIAGNOSIS — S129XXA Fracture of neck, unspecified, initial encounter: Secondary | ICD-10-CM

## 2021-02-20 DIAGNOSIS — E785 Hyperlipidemia, unspecified: Secondary | ICD-10-CM

## 2021-02-20 DIAGNOSIS — L409 Psoriasis, unspecified: Secondary | ICD-10-CM

## 2021-02-20 DIAGNOSIS — K219 Gastro-esophageal reflux disease without esophagitis: Secondary | ICD-10-CM

## 2021-02-20 DIAGNOSIS — T1491XA Suicide attempt, initial encounter: Secondary | ICD-10-CM

## 2021-02-20 DIAGNOSIS — A419 Sepsis, unspecified organism: Secondary | ICD-10-CM

## 2021-02-20 DIAGNOSIS — B20 Human immunodeficiency virus [HIV] disease: Secondary | ICD-10-CM

## 2021-02-20 DIAGNOSIS — A09 Infectious gastroenteritis and colitis, unspecified: Principal | ICD-10-CM

## 2021-02-20 DIAGNOSIS — K5792 Diverticulitis of intestine, part unspecified, without perforation or abscess without bleeding: Secondary | ICD-10-CM

## 2021-02-20 DIAGNOSIS — Z23 Encounter for immunization: Secondary | ICD-10-CM

## 2021-02-20 LAB — HIV VIRAL LOAD PCR QUANT

## 2021-02-20 NOTE — Progress Notes
Subjective:     History of Present Illness    Charles Walls is a 51 y.o. male here for HIV follow up.  ?  Patient has been followed by Dr Charles Walls previously until 2014 when he switched to [facility]. He was following Dr Charles Walls until she closed her outpatient practice and he transferred to me. Prior regimens Kaletra, Presizta, Viracept (diarrhea), Viread, Truvada, Combivir.   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 He then moved to [place] for few years and came back in 2006. He had relationship with women in the past, but was married to an HIV negative male. They are using condoms except for oral sex. They have no other partners for the past 2 years. Charles Walls was diagnosed with Chlamydia in June 2014 and treated. His partner was also treated. He continues on Darunavir, Truvada, Norvir and has good virological and immunological response. He said he was seen q31months by his [place] doctor and would like this way. Currently feeling well. His feet are better after he started Kelfex and topical creams prescribed by Dermatology last week. He is enrolled in RWSW at [facility] [place], and also has Media planner. 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 He is tested for TB there and has always been negative. Still smokes 1 ppd and is interested in quitting but waiting for change in insurance in March to get discount on his premium.        04/06/17  He was admitted from 02/01/17 through 02/04/17 with diverticulitis. 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 He is scheduled for partial nephrectomy in March 2019. Currently he is anxious for his renal mass lesion and worries about his right shoulder pain, that he describes chronic and due to a rotator cuff injuries. He quit smoking 2 months ago and using Vapor currently. For HIV, he is taking Triumeq without any missed dose, good control. He admitted that he is sexually active with 10 years younger person who was recently diagnosed as Syphilis.     02/07/19  Here for follow up for HIV. Has had difficult few months. He has been going through a divorce with his husband that happened in March. Since then has had multiple sexual partners. One of those partners may have given his syphilis, he got a single dose for primary syphilis at the health department and tested negative according to him. Was off his medications for a couple of months when he went through the divorce but is now back on them and has been compliant. 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 Was recently in the ED due to a cellulitis and an abscess from the injections. Blood cultures at that time were negative, abscess was I&D but was not sent for cultures. Prescribed 10 days of clindamycin. States that his arm has improved but developed loose stools, 2 per day. He states that he does not share needles, does not reuse needles. Uses a needle exchange program.     09/19/20  Patient comes in for follow-up.  He has missed his last appointment as he was being evaluated for cervical lymphadenopathy with concern for malignancy.  He underwent initially an FNA on 07/29/2020 which was concerning for atypical lymphocytes.  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  Patient was seen by hematology Dr. Lucius Walls who  ordered CT abdomen pelvis and chest for complete evaluation.  Patient is yet to do those scans. Patient has been sexually active and on 09/12/2020 had a positive urine NAAT for gonorrhea when he presented for evaluation of dysuria. Also patient did report some rectal discomfort today and he was not sure if it is from the enemas he is using.  He is self collected a rectal swab for GC chlamydia testing. He will receive Rocephin an Azithromycin today in clinic.  I also offered him the monkey box vaccine and he elected to take the first dose as well.  Last HIV viral load on 09/19/2020 was not detected, and CD4 count was 918 (41.2%).  Patient reported doing well on Triumeq.    02/20/2021  Follow up from recent hospitalization with left arm group a strep soft tissue infection requiring an I&D and drainage of an abscess.  He was on IV antibiotics was changed to oral Keflex for 10 more days on discharge on 02/09/2021.  He returns to clinic today reporting improvement in the left wrist infection site currently nontender.  Swelling has resolved.  He has some residual pain in the left elbow as well and with range of motion of the left wrist.  Reports good compliance with Triumeq.  Of note while hospitalized his viral load was about 5000 copies per mL.  Genotype done showed wild-type virus.  He had prior minor NNRTI and PI mutations but no M184V or rilpivirine resistance.  He is asking about Cabenuva.  Also reports to having a new boyfriend however they are not sexually active yet.  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  RPR on 02/06/2021 was 16 received 1 dose of Bicillin LA.  Devious RPR on 09/19/2020 was 4.         Review of Systems   Constitutional: Negative.    Respiratory: Negative for shortness of breath.    Gastrointestinal: Positive for diarrhea.   Musculoskeletal: Positive for arthralgias.       Objective:         ? acetaminophen (TYLENOL) 325 mg tablet Take 325 mg by mouth every 6 hours as needed for Pain.   ? apixaban (ELIQUIS) 5 mg tablet Take 10 mg (2 tablets) by mouth twice daily for 6 days, then take 5 mg (1 tablet) twice daily  Indications: extensive superficial thrombus of left cephalic vein   ? ARIPiprazole (ABILIFY) 10 mg tablet TAKE 1/2 TABLET BY MOUTH DAILY   ? atorvastatin (LIPITOR) 10 mg tablet Take one tablet by mouth daily.   ? cephalexin (KEFLEX) 500 mg capsule Take one capsule by mouth four times daily for 39 doses. Indications: skin and skin structure infection   ? nicotine (NICODERM CQ) 21 mg/day patch Apply one patch to top of skin as directed daily. Rotate patch location.  Indications: stop smoking   ? sildenafiL (VIAGRA) 50 mg tablet TAKE 1 TABLET BY MOUTH EVERY EVENING (Patient taking differently: Take 50 mg by mouth as Needed.)   ? TRIUMEQ 600-50-300 mg tablet Take 1 Tablet by mouth once daily   ? valACYclovir (VALTREX) 1 gram tablet Take 2 Tablets by mouth every 12 (twelve) hours for 4 days for outbreak   ? vitamins, multiple cap Take 1 capsule by mouth daily.     Vitals:    02/20/21 1301   BP: 118/71   Pulse: 101   PainSc: Two   Weight: 67.6 kg (149 lb)   Height: 162.6 cm (5' 4)  Body mass index is 25.58 kg/m?Marland Kitchen     Physical Exam  Constitutional: No distress.   HENT: unremarkable  Head: Normocephalic and atraumatic.   Eyes: No scleral icterus.    Pulmonary/Chest: Effort normal.   Musculoskeletal: Slight tenderness on range of motion of left wrist  Neurological: He is alert.  Skin: No rash, healing left wrist wound.  Psychiatric: He has a normal mood and affect.   Vitals reviewed.       Assessment:  HIV   ? Diagnosed 2000 while in [facility]. Dr Jon Billings [facility]).   ? Establishment of care with [facility] : 07/31/13  ? Mode of transmission / risk factors : MSM  ? CD4 count / viral load at the time of diagnosis : not sure , likely CD 4 around 250  ? History of OI : none reported  ? Need for prophylaxis : none reported  ? FePO4 14% (within normal)  ?  Past HIV data Viral load/ CD4 count, ART therapy :  Date   CD4 ( % )  Viral load ( log)  ART therapy  Comments / genotype Resistence testing     11/28/13  In process  <40 (<1.6)  ? ?   08/27/13  993(48.7)  <40 (<1.6)  ? ?   06/13/13  ? 54 (1.73)  ([place])  ?   03/30/13  Not done  <20(<1.3)  ? ?   03/26/13  724(57%)?  24(1.380  ? ?   01/15/13  1124(48)  <20 (<1.3)  ? ?   10/23/12  1154  <20(<1.3)  ? ?   08/23/12  901  <20(<1.3)  ? ?   07/27/12  991  <20(<1.3)  Likely switched to new regimen daruavir and ritonavir instead of Kaletra  (mid 2014)    06/30/12  1052  <20 ND (<1.3)  ? ?   05/08/12 948(46)  3891.58  ? ?   03/27/12  Not done  22(1.34)  ? ?   03/13/12  Not done  31(1.4)  ? ?   02/25/12  879(47)  41(1.61)  ? ?   02/11/12  962(43)  34(1.53)  ? ?   10/27/12  1072(46)  61(1.79)  ? ?   01/14/12  849(50)  46(1.66)  Was on Keletra and Truvada  ?   12/23/11  936 (46)  53 (1.72)  ? ?   ?  Base line lab parameter / screening   ? TB test : T spot TB test negative on 07/31/13  ? Toxoplasma serology: IgG negative on 07/31/13  ? Hepatitis serology : 07/31/13  ? Hepatitis BS antigen : negative  ? Hepatitis BS antibody : 10.8. (immune)  ? Hepatitis B core antibody: negative  ? Hepatitis A antibody: positive  ? Hepatis C antibody: negative  ?  Immunizations   ? Hepatitis vaccine : received both A&B: received booster Hepatitis B vaccine 10/01/13  ? Tdap vaccine : 05/11/13  ? Prevnar ( 07/31/13), PPSV 23 ( 10/01/13) --> PPSV23 booster needed 2020  ? Menveo in 2017 --> booster needed 2023  ? Annual flu shot  ? Mpox 09/2020 --> booster given 02/20/21  ? COVID-19 booster needed  ?  H/O STD / STD screening / anal pap/ risk factors   ? H/O STDs : H/O chlamydia 07/27/12 : treated  ? Syphilis test : negative / patient in 07/2012  ? Urine for GC / CT PCR : 07/31/13 Negative , syphilis negative  ? Anal pap smear : 2-3 years ago ,  negative / patient  ? On going risk factors :  ? H/O multiple sexual partners in the past.  ? Separated from previous HIV-neg husband. Now new boyfriend   ? Anal pap discussed 02/2014. He declined.  ? Completed 3 doses of Bicillin LA on 09/01/16 RPR 2.  ? 09/19/20 RPR 4, 02/06/21 RPR 16 given 1 dose Bicillin LA  ?  Prolonged ART therapy / lab monitoring   ? UA 05/21/16 protein negative , glucose negative.  ? hemoglobin A1C 5.5 on 02/02/17.  ? Vitamin D level (on 05/21/16) 29.3: Continue OTC Vitamin D    Admit for Group A Strep L arm cellulitis/abscess    - s/p I&D and course of Abx   - 02/20/21 Follow up wound healed. Having diarrhea will check for C diff. Stopping Keflex.  ?  Other co morbidities   ? History of tinea pedis.  ? Intermittent chest pain  : Tobacco dependence. Not drinking beer anymore. Stop smoking in January 2019 and using Vapor.  : Has strong family history of heart attack.   : EF 65-70%, TEE 02/04/17: Bicuspid aortic valve with focal sclerosis.  ? History of neck pain. Seen neurosurgery in the past per note.  ? Chronic cough : currently being addressed by PCP : 25 year pack smoking history.  ? Headache. Head CT unremarkable 11/28/13.  ? Rt shoulder pain, chronic  ? 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    H/o Diverticulitis  ? Admitted 02/01/17 - 02/04/17, treated    Left upper pole renal mass  ? Incidental findings when he was admitted with Diverticulitis in January 2019.  ? 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   ? Scheduled for partial nephrectomy in March 209.      Plan:  - labs today HIV VL, C diff PCR   - treated for syphilis inpatient 02/06/21  - Second Mpox vaccine today 02/20/21  - Continue Triumeq  - Follow-up in 2 months     Total Time Today was 20 minutes in the following activities: Performing a medically appropriate examination and/or evaluation, discusing with patient, reviewing labs, HIV genotype, vaccines, documenting.

## 2021-02-20 NOTE — Progress Notes
Pharmacy Medication Re-assessment    Summary of Visit  Charles Walls was just hospitalized for a left wrist abscess.  He was on Zosyn and vancomycin initially, then transitioned to linezolid and ceftriaxone and discharged on oral cephalexin.  Dr. Colon Flattery okayed him to discontinue that today.  He is having watery diarrhea.  Checking C.diff today as well.     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  He was started on apixaban with a planned 3 month course.  He says this is going well but he has noticed increase in bruising.      He admits to missing some doses of his Triumeq prior to his hospitalization.  His viral load was 5,621.  He said he has not missed any since leaving the hospital and takes it every morning around 10am.  I provided him with a pill box today.    Lastly, he asked about Cabenuva.  Discussed he would need to be virologically suppressed for six month minimum before he would be a candidate.   He had a genotype when he was admitted that was pan-sesnitive, no mutations detected.  He had an archive genotype done in 2016 that showed two NNRTI mutations (V90I, V179VI) and three PI mutations (E35ED, I62V, A71T).  But no resistance detected.  He has never taken a NNRTI regimen so unlikely to have developed resistance unless transmitted since.    He does have a positive hepatitis B core antibody but antigen negative but also not immune.  Will need to re-vaccinate.    Briefly discussed depression associated with rilpivirine but he felt he was stable at this time but will need to address again if we move forward with Cabenuva in the future.     Indication/Regimen  The regimen of TRIUMEQ 600-50-300 MG PO TAB indefinitely is appropriate for [name] who has HIV (human immunodeficiency virus infection) (HCC).    Renal dose adjustments are not required. Hepatic dose adjustments are not required. Dose titration is not required.    The patient has the ability to self-administer the medication(s).    Regimen Assessment  The patient is on a single tablet regimen.    The patient's regimen was not changed.    Therapeutic Goals and Monitoring  Patient is HIV positive. HIV genotype has been reviewed and treatment with select therapy is appropriate. The goal of therapy is to decrease viral load and increase CD4 count.    Patient has completed labs. Patient's viral load is suppressed and does have an improved HIV virologic and immunologic response.    The patient is making progress toward achieving their therapeutic goal. The plan is to continue current therapy.    Past Medical History and Comorbidities  Patient Active Problem List   Diagnosis   ? HIV (human immunodeficiency virus infection) (HCC)   ? Hyperlipidemia   ? Tobacco abuse   ? Alcohol use disorder, moderate, in sustained remission (HCC)   ? Right-sided tinnitus   ? Renal cell cancer, left (HCC)   ? Calcification of aortic valve   ? Renal cell carcinoma of left kidney (HCC)   ? Lymphadenopathy, inguinal   ? Methamphetamine use disorder, mild (HCC)   ? Other stimulant dependence, uncomplicated (HCC)   ? Other primary thrombophilia (HCC)   ? Oral herpes simplex infection   ? Cellulitis of left lower extremity   ? 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 Lymphadenopathy   ? 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 Spondylosis of cervical joint   ? Lymphoma of lymph nodes of neck, unspecified lymphoma type (HCC)   ? Immunodeficiency, unspecified (HCC)   ? Pulmonary emphysema, unspecified emphysema type (HCC)   ? Abscess of skin of left wrist   ? Syphilis     Additional comorbidities: yes  Vitamin D deficiency, erectile dysfunction     Labs and Diagnostic Tests  CD4 Count   Date/Time Value Ref Range Status   02/06/2021 02:51 AM 724 440 - 2,160 Final          HIV-1, copies/mL   Date/Time Value Ref Range Status   02/06/2021 07:18 AM 5,621 (A) HIVND [copies]/mL Final     Comment:     The test method detects HIV-1 viral load using the Abbott assay. Please   correlate results with the clinical status of the patient.  NOTE NEW METHODOLOGY AND REFERENCE RANGE          HIV Viral Load: The patients viral load is suppressed.          HBV-related labs:       HBsAg   Date/Time Value Ref Range Status   08/27/2020 11:52 AM NONREACTIVE NR-NONREACTIVE Final     Comment:     HBs antigen not detected.          Anti HBc Total   Date Value Ref Range Status   08/27/2020 REACTIVE (A) NR-NONREACTIVE Final     Comment:     Presumptive evidence of antibodies to HBV core antigen (anti-HBC), indicating   acute, chronic, past, or resolved hepatitis B infection.            HCV-related labs:       Anti HCV   Date Value Ref Range Status   02/06/2021 NONREACTIVE NR-NONREACTIVE Final     Comment:     Antibodies to HCV were not detected.          HLA-B*[id number] status: negative     Tropism testing: not applicable       Allergies  Allergies   Allergen Reactions   ? Sulfa (Sulfonamide Antibiotics) SEE COMMENTS     Patient reports that his father and brother are allergic, he is unsure so he reports it as an allergy.         Immunizations  Vaccine history was reviewed with the patient. Education was provided on the importance of completing vaccines, including annual influenza vaccine, COVID-19 vaccine and hepatitis B vaccine if not immune.    Immunization History   Administered Date(s) Administered   ? COVID-19 (PFIZER), mRNA vacc, 30 mcg/0.3 mL (PF) 05/07/2019, 05/28/2019   ? Flu Vaccine =>6 Months Quadrivalent PF 11/18/2020   ? Flu Vaccine =>65 YO High-Dose (PF) 02/11/2016, 11/24/2016   ? Flu Vaccine =>65 YO High-Dose Quadrivalent (PF) 02/07/2019   ? Flu Vaccine Trivalent =>3 Yo (Preservative Free) 10/04/2013   ? Hepatitis B Vaccine Adult 3 Dose IM 10/01/2013   ? Meningococcal Conjug Vaccine IM (MenACWY-CRM)(Menveo) 06/18/2015, 02/11/2016   ? Pneumococcal Vaccine (23-Val Adult) 10/01/2013   ? Pneumococcal Vaccine(13-Val Peds/immunocompromised adult) 07/31/2013   ? Smallpox and Monkeypox Vaccine Live PF ID 09/19/2020   ? Tdap Vaccine 05/11/2013, 08/09/2019       Home Medications    Medication Sig   acetaminophen (TYLENOL) 325 mg tablet Take 325 mg by mouth every 6 hours as needed for Pain.   apixaban (  ELIQUIS) 5 mg tablet Take 10 mg (2 tablets) by mouth twice daily for 6 days, then take 5 mg (1 tablet) twice daily  Indications: extensive superficial thrombus of left cephalic vein   ARIPiprazole (ABILIFY) 10 mg tablet TAKE 1/2 TABLET BY MOUTH DAILY   atorvastatin (LIPITOR) 10 mg tablet Take one tablet by mouth daily.   cephalexin (KEFLEX) 500 mg capsule Take one capsule by mouth four times daily for 39 doses. Indications: skin and skin structure infection   nicotine (NICODERM CQ) 21 mg/day patch Apply one patch to top of skin as directed daily. Rotate patch location.  Indications: stop smoking   sildenafiL (VIAGRA) 50 mg tablet TAKE 1 TABLET BY MOUTH EVERY EVENING  Patient taking differently: Take 50 mg by mouth as Needed.   TRIUMEQ 600-50-300 mg tablet Take 1 Tablet by mouth once daily   valACYclovir (VALTREX) 1 gram tablet Take 2 Tablets by mouth every 12 (twelve) hours for 4 days for outbreak   vitamins, multiple cap Take 1 capsule by mouth daily.     Medication Reconciliation  Medication history and reconciliation were performed (including prescription medications, supplements, over the counter, and herbal products). The medication list was updated and the patient's current medication list is included above. The patient was instructed to speak with their health care provider before starting any new drug, including prescription or over the counter, natural / herbal products, or vitamins.    Drug Interactions    Drug-Drug Interactions  Drug-drug interactions were evaluated. There were not clinically significant drug-drug interactions.     Drug-Food Interactions  Drug-food interactions were evaluated. There are not clinically significant drug-food interactions.    Triumeq  should be taken with or without food.    Adverse Drug Reactions  Adverse drug reactions were reviewed with the patient.    Significant adverse drug reaction(s) were not identified.    Side effect(s) were not reported.    Adherence  Refill and adherence history were reviewed with the patient. The patient was educated on the importance of adherence.    Patient is adherent with refills: yes  Patient is meeting refill adherence goal: yes    Patient reported 0 missed doses over the past 1 week.   Patient is meeting reported adherence goal.    Safety Precautions    Risk Evaluation and Mitigation (REMS) Assessment: REMS is not required for this medication.    Safety precautions were addressed and discussed with the patient as applicable.    Contraindications: BRAYLIN XU does not have contraindications to this medication.      Toxoplasmosis screening, sexually transmitted infections testing, and comorbid conditions assessment:    Patient has been screened for tuberculosis, toxoplasmosis, STIs, and assessed for comorbid conditions as appropriate.    Depression screening:    Patient has been appropriately screened by healthcare team.    Substance abuse screening (tobacco, alcohol, illicit substances):    Patient was screened for substance abuse and counseling was provided.    Pregnancy Status: Male, education not applicable.    Medication Education  Counseling was not completed because patient was previously educated and did not require additional counseling.    OSKER AYOUB was given the opportunity to ask questions but did not have any questions at the time. Patient was reminded of the refill process and encouraged to call with questions. The monitoring and follow-up plan was discussed with the patient. The patient was instructed to contact their health care provider if their symptoms or health  problems do not get better or if they become worse. The patient should contact the specialty pharmacy at [phone] if they have any questions or concerns regarding their medication therapy. The patient verbalized acceptance and understanding.    Follow-up Plan  The patient will be reassessed within 1 year.      Insurance: [employer] and [employer] Program involvement: Anthoney Harada at [facility] is his case manager    The medication(s) will be received from an outside pharmacy Chartered loss adjuster ) based on patient preference due to Prefers to fill his medication at the pharmacy where his Juanell Fairly case manager is located .    Carmelina Dane, PHARMD

## 2021-02-20 NOTE — Progress Notes
Administered 0.1ml of Jynneos vaccine #2 into pt right ventral forearm. Pt signed consent form and was given VIS sheet. Pt tolerated injection well and had no complaint. Pt had no further questions at this time.

## 2021-02-23 ENCOUNTER — Encounter: Admit: 2021-02-23 | Discharge: 2021-02-23 | Payer: No Typology Code available for payment source

## 2021-02-23 ENCOUNTER — Ambulatory Visit: Admit: 2021-02-23 | Discharge: 2021-02-23 | Payer: No Typology Code available for payment source

## 2021-02-23 VITALS — BP 138/86 | HR 89 | Wt 149.0 lb

## 2021-02-23 DIAGNOSIS — S129XXA Fracture of neck, unspecified, initial encounter: Secondary | ICD-10-CM

## 2021-02-23 DIAGNOSIS — L02414 Cutaneous abscess of left upper limb: Principal | ICD-10-CM

## 2021-02-23 DIAGNOSIS — K5792 Diverticulitis of intestine, part unspecified, without perforation or abscess without bleeding: Secondary | ICD-10-CM

## 2021-02-23 DIAGNOSIS — E785 Hyperlipidemia, unspecified: Secondary | ICD-10-CM

## 2021-02-23 DIAGNOSIS — L409 Psoriasis, unspecified: Secondary | ICD-10-CM

## 2021-02-23 DIAGNOSIS — A419 Sepsis, unspecified organism: Secondary | ICD-10-CM

## 2021-02-23 DIAGNOSIS — T1491XA Suicide attempt, initial encounter: Secondary | ICD-10-CM

## 2021-02-23 DIAGNOSIS — B20 Human immunodeficiency virus [HIV] disease: Secondary | ICD-10-CM

## 2021-02-23 DIAGNOSIS — K219 Gastro-esophageal reflux disease without esophagitis: Secondary | ICD-10-CM

## 2021-02-23 NOTE — Progress Notes
Mr. Dray was seen today in follow-up regarding a left dorsal wrist abscess.  The plastic surgery service was consulted to deal with this on a recent hospital admission and an incision and drainage was undertaken at bedside.    Since discharge from hospital he reports that he is gradually beginning to feel better although his left elbow is somewhat sore.  He attributes this to resting with it in the [place] pillow with the elbow flexed and the hand elevated to minimize edema.    On examination he appears well.    Focused physical examination of the left upper extremity reveals a well-healed longitudinally oriented incision over the dorsal wrist where he had the incision and drainage done.  He has indurated tender nonfluctuant swelling over the distal forearm along the course of the cephalic vein.    Hand function is within normal limits.    Examination of the elbow is unconcerning.  The principal finding is that with full extension he reports tightness along the anterior aspect of the elbow.    I reviewed with Mr. Ginger that there are no signs of residual abscess.  My opinion is that the indurated tender nonfluctuant swelling over the distal forearm is most in keeping with the finding of the superficial venous thrombosis in the cephalic vein that was found on previous ultrasound on 1/19.  My opinion is that the elbow tenderness is tightness resulting from prolonged positioning and flexion and I think that this should resolve spontaneously as he continues to engage in her normal usage pattern.    I do want to follow the cephalic vein thrombophlebitis/superficial venous thrombosis and for this reason I will get a new ultrasound of the left upper extremity.    Benay Pike, MD

## 2021-02-23 NOTE — Progress Notes
Subjective:       History of Present Illness  Charles Walls is a 51 y.o. male.       Review of Systems   Constitutional: Negative.    HENT: Negative.    Eyes: Negative.    Respiratory: Negative.    Cardiovascular: Negative.    Gastrointestinal: Negative.    Endocrine: Negative.    Genitourinary: Negative.    Musculoskeletal: Positive for arthralgias and neck pain.   Skin: Negative.    Allergic/Immunologic: Negative.    Neurological: Negative.    Hematological: Negative.    Psychiatric/Behavioral: Negative.          Objective:          acetaminophen (TYLENOL) 325 mg tablet Take 325 mg by mouth every 6 hours as needed for Pain.    apixaban (ELIQUIS) 5 mg tablet Take 10 mg (2 tablets) by mouth twice daily for 6 days, then take 5 mg (1 tablet) twice daily  Indications: extensive superficial thrombus of left cephalic vein    ARIPiprazole (ABILIFY) 10 mg tablet TAKE 1/2 TABLET BY MOUTH DAILY    atorvastatin (LIPITOR) 10 mg tablet Take one tablet by mouth daily.    nicotine (NICODERM CQ) 21 mg/day patch Apply one patch to top of skin as directed daily. Rotate patch location.  Indications: stop smoking    sildenafiL (VIAGRA) 50 mg tablet TAKE 1 TABLET BY MOUTH EVERY EVENING (Patient taking differently: Take 50 mg by mouth as Needed.)    TRIUMEQ 600-50-300 mg tablet Take 1 Tablet by mouth once daily    valACYclovir (VALTREX) 1 gram tablet Take 2 Tablets by mouth every 12 (twelve) hours for 4 days for outbreak    vitamins, multiple cap Take 1 capsule by mouth daily.     Vitals:    02/23/21 0853   BP: 138/86   Pulse: 89   PainSc: Five   Weight: 67.6 kg (149 lb)     Body mass index is 25.58 kg/m.     Physical Exam         Assessment and Plan:

## 2021-03-02 ENCOUNTER — Encounter: Admit: 2021-03-02 | Discharge: 2021-03-02 | Payer: No Typology Code available for payment source

## 2021-03-02 MED ORDER — NICOTINE 21 MG/24 HR TD PT24
MEDICATED_PATCH | Freq: Every day | 0 refills
Start: 2021-03-02 — End: ?

## 2021-03-04 ENCOUNTER — Encounter: Admit: 2021-03-04 | Discharge: 2021-03-04 | Payer: No Typology Code available for payment source

## 2021-03-13 ENCOUNTER — Encounter: Admit: 2021-03-13 | Discharge: 2021-03-13 | Payer: No Typology Code available for payment source

## 2021-03-13 NOTE — Telephone Encounter
Received vm from [place] requesting clearance to get dental procedure while on Eliquis.      Called Charles Walls, explained that he needs to talk to Farrel Gobble to get clearance from her on that medication.  He voiced understanding

## 2021-03-19 ENCOUNTER — Encounter: Admit: 2021-03-19 | Discharge: 2021-03-19 | Payer: No Typology Code available for payment source

## 2021-03-19 MED ORDER — SILDENAFIL 50 MG PO TAB
ORAL_TABLET | 2 refills
Start: 2021-03-19 — End: ?

## 2021-03-19 NOTE — Telephone Encounter
Pt. requesting the fax number for Dr. Farrel Gobble for a medical release for eliquis to give to his dentist. LVM [phone]

## 2021-03-19 NOTE — Telephone Encounter
Recent Visits  Date Type Provider Dept   01/23/21 Office Visit Yet[facility] Numbers, [place] [facility]   11/18/20 Appointment [facility]   11/18/20 Office Visit Yetta Numbers, [place] [facility]   07/09/20 Office Visit Yetta Numbers, [place] [facility]   05/16/20 Office Visit Yetta Numbers, [place] [facility]   04/22/20 Office Visit Yetta Numbers, [place] [facility]   Showing recent visits within past 720 days and meeting all other requirements  Future Appointments  Date Type Provider Dept   05/18/21 Appointment Yetta Numbers, APRN-NP [phone] [facility]   Showing future appointments within next 360 days and meeting all other requirements      Last filled 12/26/2020 #10 R2

## 2021-03-20 ENCOUNTER — Encounter: Admit: 2021-03-20 | Discharge: 2021-03-20 | Payer: No Typology Code available for payment source

## 2021-03-23 ENCOUNTER — Encounter: Admit: 2021-03-23 | Discharge: 2021-03-23 | Payer: No Typology Code available for payment source

## 2021-03-23 DIAGNOSIS — B20 Human immunodeficiency virus [HIV] disease: Secondary | ICD-10-CM

## 2021-03-23 MED ORDER — NICOTINE 21 MG/24 HR TD PT24
MEDICATED_PATCH | 0 refills
Start: 2021-03-23 — End: ?

## 2021-03-23 MED ORDER — TRIUMEQ 600-50-300 MG PO TAB
ORAL_TABLET | 3 refills
Start: 2021-03-23 — End: ?

## 2021-03-23 NOTE — Telephone Encounter
Last Lab:  [id number]  Last Viral Load:02/20/21 <20  Last STI testing: January 2023 RPR up to 16  Last OV: [id number]  Next OV: 05/06/2021

## 2021-03-23 NOTE — Telephone Encounter
Per verbal order from Dr. Mayra Reel, patient needs treated for syphilis, elevated RPR titer from 4 09/22 to 16 01/23. Check with patient about exposures in that time.   Will administer 2.4 Mil Units x1 if no other symptoms.    Called and left message for Wesly to return call.

## 2021-03-24 ENCOUNTER — Encounter: Admit: 2021-03-24 | Discharge: 2021-03-24 | Payer: No Typology Code available for payment source

## 2021-03-24 ENCOUNTER — Ambulatory Visit: Admit: 2021-03-24 | Discharge: 2021-03-25 | Payer: No Typology Code available for payment source

## 2021-03-24 DIAGNOSIS — A539 Syphilis, unspecified: Secondary | ICD-10-CM

## 2021-03-24 MED ORDER — PENICILLIN G BENZATHINE 1,200,000 UNIT/2 ML IM SYRG
1.2 10*6.[IU] | Freq: Once | INTRAMUSCULAR | 0 refills | Status: CP
Start: 2021-03-24 — End: ?
  Administered 2021-03-24: 20:00:00 1.2 10*6.[IU] via INTRAMUSCULAR

## 2021-03-31 ENCOUNTER — Encounter: Admit: 2021-03-31 | Discharge: 2021-03-31 | Payer: No Typology Code available for payment source

## 2021-03-31 ENCOUNTER — Ambulatory Visit: Admit: 2021-03-31 | Discharge: 2021-03-31 | Payer: No Typology Code available for payment source

## 2021-03-31 DIAGNOSIS — L02414 Cutaneous abscess of left upper limb: Secondary | ICD-10-CM

## 2021-04-06 ENCOUNTER — Encounter: Admit: 2021-04-06 | Discharge: 2021-04-06 | Payer: No Typology Code available for payment source

## 2021-04-06 NOTE — Telephone Encounter
LOV 01/23/21    Pt calling to see why his appt for tomorrow was cxld. Ret call and LVM. Advised it shows it was cxld by pt through [facility]. Asked pt to call back so we can get him rescheduled. Pt needs appt for post-hospitalization and Eliquis start.

## 2021-04-07 ENCOUNTER — Encounter: Admit: 2021-04-07 | Discharge: 2021-04-07 | Payer: No Typology Code available for payment source

## 2021-04-15 ENCOUNTER — Encounter: Admit: 2021-04-15 | Discharge: 2021-04-15 | Payer: No Typology Code available for payment source

## 2021-04-15 MED ORDER — NICOTINE 21 MG/24 HR TD PT24
MEDICATED_PATCH | 0 refills
Start: 2021-04-15 — End: ?

## 2021-04-23 ENCOUNTER — Encounter: Admit: 2021-04-23 | Discharge: 2021-04-23 | Payer: No Typology Code available for payment source

## 2021-04-23 MED ORDER — NICOTINE 21 MG/24 HR TD PT24
MEDICATED_PATCH | 0 refills
Start: 2021-04-23 — End: ?

## 2021-04-27 ENCOUNTER — Encounter: Admit: 2021-04-27 | Discharge: 2021-04-27 | Payer: No Typology Code available for payment source

## 2021-05-07 ENCOUNTER — Encounter: Admit: 2021-05-07 | Discharge: 2021-05-07 | Payer: No Typology Code available for payment source

## 2021-05-14 ENCOUNTER — Encounter: Admit: 2021-05-14 | Discharge: 2021-05-14 | Payer: No Typology Code available for payment source

## 2021-05-14 MED ORDER — ELIQUIS 5 MG PO TAB
ORAL_TABLET | 2 refills
Start: 2021-05-14 — End: ?

## 2021-05-31 ENCOUNTER — Encounter: Admit: 2021-05-31 | Discharge: 2021-05-31 | Payer: No Typology Code available for payment source

## 2021-05-31 ENCOUNTER — Emergency Department: Admit: 2021-05-31 | Discharge: 2021-05-31 | Payer: No Typology Code available for payment source

## 2021-05-31 ENCOUNTER — Inpatient Hospital Stay: Admit: 2021-05-31 | Payer: No Typology Code available for payment source

## 2021-05-31 DIAGNOSIS — L0390 Cellulitis, unspecified: Secondary | ICD-10-CM

## 2021-05-31 DIAGNOSIS — L03317 Cellulitis of buttock: Secondary | ICD-10-CM

## 2021-05-31 LAB — COMPREHENSIVE METABOLIC PANEL
ALBUMIN: 4 g/dL (ref 3.5–5.0)
ALK PHOSPHATASE: 96 U/L — ABNORMAL LOW (ref 25–110)
ALT: 14 U/L (ref 7–56)
ANION GAP: 7 K/UL — ABNORMAL HIGH (ref 3–12)
AST: 16 U/L (ref 7–40)
BLD UREA NITROGEN: 12 mg/dL (ref 7–25)
CALCIUM: 9.6 mg/dL (ref 8.5–10.6)
CHLORIDE: 101 MMOL/L (ref 98–110)
CO2: 27 MMOL/L (ref 21–30)
EGFR: >60 mL/min (ref >60)
GLUCOSE,PANEL: 83 mg/dL (ref 70–100)
POTASSIUM: 4.4 MMOL/L (ref 3.5–5.1)
SODIUM: 135 MMOL/L — ABNORMAL LOW (ref 137–147)
TOTAL BILIRUBIN: 0.4 mg/dL (ref 0.3–1.2)
TOTAL PROTEIN: 7.6 g/dL — ABNORMAL HIGH (ref 6.0–8.0)

## 2021-05-31 LAB — CBC AND DIFF
ABSOLUTE BASO COUNT: 0 K/UL (ref 0–0.20)
ABSOLUTE EOS COUNT: 0 K/UL (ref 0–0.45)
ABSOLUTE MONO COUNT: 1.3 K/UL — ABNORMAL HIGH (ref 0–0.80)
MDW (MONOCYTE DISTRIBUTION WIDTH): 24 — ABNORMAL HIGH (ref <20.7)
WBC COUNT: 13 K/UL — ABNORMAL HIGH (ref 4.5–11.0)

## 2021-05-31 LAB — URINALYSIS DIPSTICK REFLEX TO CULTURE
GLUCOSE,UA: NEGATIVE
LEUKOCYTES: NEGATIVE
NITRITE: NEGATIVE
URINE ASCORBIC ACID, UA: NEGATIVE
URINE BILE: NEGATIVE
URINE BLOOD: NEGATIVE
URINE KETONE: NEGATIVE

## 2021-05-31 LAB — URINALYSIS MICROSCOPIC REFLEX TO CULTURE

## 2021-05-31 LAB — POC LACTATE: LACTIC ACID POC: 1.5 MMOL/L (ref 0.5–2.0)

## 2021-05-31 MED ORDER — MORPHINE 4 MG/ML IV SYRG
4 mg | Freq: Once | INTRAVENOUS | 0 refills | Status: CP
Start: 2021-05-31 — End: ?
  Administered 2021-05-31: 18:00:00 4 mg via INTRAVENOUS

## 2021-05-31 MED ORDER — ONDANSETRON 4 MG PO TBDI
4 mg | ORAL | 0 refills | Status: AC | PRN
Start: 2021-05-31 — End: ?

## 2021-05-31 MED ORDER — MELATONIN 5 MG PO TAB
5 mg | Freq: Every evening | ORAL | 0 refills | Status: AC | PRN
Start: 2021-05-31 — End: ?

## 2021-05-31 MED ORDER — APIXABAN 5 MG PO TAB
5 mg | Freq: Two times a day (BID) | ORAL | 0 refills | Status: AC
Start: 2021-05-31 — End: ?
  Administered 2021-06-01 – 2021-06-05 (×7): 5 mg via ORAL

## 2021-05-31 MED ORDER — DOLUTEGRAVIR 50 MG PO TAB
50 mg | Freq: Every day | ORAL | 0 refills | Status: AC
Start: 2021-05-31 — End: ?
  Administered 2021-05-31 – 2021-06-05 (×6): 50 mg via ORAL

## 2021-05-31 MED ORDER — CEFTRIAXONE INJ 1GM IVP
1 g | Freq: Once | INTRAVENOUS | 0 refills | Status: CP
Start: 2021-05-31 — End: ?
  Administered 2021-05-31: 18:00:00 1 g via INTRAVENOUS

## 2021-05-31 MED ORDER — IOHEXOL 350 MG IODINE/ML IV SOLN
80 mL | Freq: Once | INTRAVENOUS | 0 refills | Status: CP
Start: 2021-05-31 — End: ?
  Administered 2021-05-31: 18:00:00 80 mL via INTRAVENOUS

## 2021-05-31 MED ORDER — ABACAVIR 300 MG PO TAB
600 mg | Freq: Every day | ORAL | 0 refills | Status: AC
Start: 2021-05-31 — End: ?
  Administered 2021-05-31 – 2021-06-05 (×6): 600 mg via ORAL

## 2021-05-31 MED ORDER — PIPERACILLIN/TAZOBACTAM 4.5 G/100ML NS IVPB (MB+)
4.5 g | Freq: Once | INTRAVENOUS | 0 refills | Status: CP
Start: 2021-05-31 — End: ?
  Administered 2021-05-31 (×2): 4.5 g via INTRAVENOUS

## 2021-05-31 MED ORDER — ACETAMINOPHEN 325 MG PO TAB
650 mg | ORAL | 0 refills | Status: AC | PRN
Start: 2021-05-31 — End: ?
  Administered 2021-05-31 – 2021-06-04 (×5): 650 mg via ORAL

## 2021-05-31 MED ORDER — DOXYCYCLINE 100ML IVPB
100 mg | Freq: Once | INTRAVENOUS | 0 refills | Status: CP
Start: 2021-05-31 — End: ?
  Administered 2021-05-31 (×2): 100 mg via INTRAVENOUS

## 2021-05-31 MED ORDER — LACTATED RINGERS IV SOLP
30 mL/kg | Freq: Once | INTRAVENOUS | 0 refills | Status: CP
Start: 2021-05-31 — End: ?
  Administered 2021-05-31: 17:00:00 2040 mL via INTRAVENOUS

## 2021-05-31 MED ORDER — FENTANYL CITRATE (PF) 50 MCG/ML IJ SOLN
25 ug | Freq: Once | INTRAVENOUS | 0 refills | Status: CP
Start: 2021-05-31 — End: ?
  Administered 2021-05-31: 17:00:00 25 ug via INTRAVENOUS

## 2021-05-31 MED ORDER — METRONIDAZOLE IN NACL (ISO-OS) 500 MG/100 ML IV PGBK
500 mg | Freq: Once | INTRAVENOUS | 0 refills | Status: CP
Start: 2021-05-31 — End: ?
  Administered 2021-05-31: 18:00:00 500 mg via INTRAVENOUS

## 2021-05-31 MED ORDER — ONDANSETRON HCL (PF) 4 MG/2 ML IJ SOLN
4 mg | INTRAVENOUS | 0 refills | Status: AC | PRN
Start: 2021-05-31 — End: ?

## 2021-05-31 MED ORDER — SODIUM CHLORIDE 0.9 % IJ SOLN
50 mL | Freq: Once | INTRAVENOUS | 0 refills | Status: CP
Start: 2021-05-31 — End: ?
  Administered 2021-05-31: 18:00:00 50 mL via INTRAVENOUS

## 2021-05-31 MED ORDER — ATORVASTATIN 10 MG PO TAB
10 mg | Freq: Every day | ORAL | 0 refills | Status: AC
Start: 2021-05-31 — End: ?
  Administered 2021-05-31 – 2021-06-05 (×6): 10 mg via ORAL

## 2021-05-31 MED ORDER — LAMIVUDINE 100 MG PO TAB
300 mg | Freq: Every day | ORAL | 0 refills | Status: AC
Start: 2021-05-31 — End: ?
  Administered 2021-05-31 – 2021-06-05 (×6): 300 mg via ORAL

## 2021-05-31 MED ORDER — DOXYCYCLINE HYCLATE 100 MG PO TAB
100 mg | Freq: Two times a day (BID) | ORAL | 0 refills | Status: AC
Start: 2021-05-31 — End: ?
  Administered 2021-06-01 (×2): 100 mg via ORAL

## 2021-05-31 MED ORDER — SENNOSIDES-DOCUSATE SODIUM 8.6-50 MG PO TAB
1 | Freq: Every day | ORAL | 0 refills | Status: AC | PRN
Start: 2021-05-31 — End: ?

## 2021-05-31 MED ORDER — PIPERACILLIN/TAZOBACTAM 4.5 G/NS (MB+)(EXTENDED INFUSION)
4.5 g | INTRAVENOUS | 0 refills | Status: AC
Start: 2021-05-31 — End: ?
  Administered 2021-06-01 (×4): 4.5 g via INTRAVENOUS

## 2021-05-31 MED ORDER — POLYETHYLENE GLYCOL 3350 17 GRAM PO PWPK
1 | Freq: Every day | ORAL | 0 refills | Status: AC | PRN
Start: 2021-05-31 — End: ?

## 2021-05-31 MED ADMIN — SODIUM CHLORIDE 0.9 % IV SOLP [27838]: 1000 mL | INTRAVENOUS | @ 23:00:00 | Stop: 2021-05-31 | NDC 00338004904

## 2021-05-31 NOTE — ED Provider Notes
Charles Walls is a 51 y.o. male.    Chief Complaint:  Chief Complaint   Patient presents with   ? Abscess     Pain, swelling, and redness noted to left buttock. Firm to touch. X 3 days.        History of Present Illness:  HPI   Charles Walls is a 51 year old male with a relevant history of HLD, HIV, Diverticulitis who is presenting to [facility] ED with a chief complaint of L gluteal abscess.    Patient reports he has been largely in his normal state of health up until about x3 days ago when he started to notice pain around his L gluteus. He reports pain and discomfort was relatively minor, but over the past few days the pain has increased considerably and he is no longer able to apply pressure to the area without severe pain. Charles Walls additionally reports some associated low grade fever over the past few days up to 100F. Patient denies any obvious trauma, cuts, scrapes, scratches to the area but does report recent (~5 days) of receptive anal intercourse without condom use.     He notes 100% compliance with Triumeq with no missed doses over the past several months.       Review of Systems:  Review of Systems   Constitutional: Negative for chills and fatigue.   HENT: Negative for congestion.    Respiratory: Negative for cough, choking, chest tightness and shortness of breath.    Cardiovascular: Negative for chest pain, palpitations and leg swelling.   Gastrointestinal: Negative for abdominal pain, constipation, diarrhea, nausea and vomiting.   Genitourinary: Negative for dysuria, frequency and urgency.   Musculoskeletal: Negative for arthralgias.   Neurological: Negative for dizziness and light-headedness.   Psychiatric/Behavioral: Negative for agitation.       Allergies:  Sulfa (sulfonamide antibiotics)    Past Medical History:  Medical History:   Diagnosis Date   ? Diverticulitis 02/02/2017   ? Fracture cervical vertebra-closed (HCC)     c6, c7 - no surgical repair   ? Gastroesophageal reflux disease without esophagitis 10/05/2019    Ordered omeprazole 40mg  qday before breakfast.  Discussed lifestyle modifications. No current red flag symptoms.  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 Human immunodeficiency virus (HIV) disease (HCC)    ? Hyperlipidemia 06/13/2013   ? Psoriasis, unspecified 02/21/2014   ? Sepsis (HCC) 03/20/2019   ? Suicide attempt [facility])        Past Surgical History:  Surgical History:   Procedure Laterality Date   ? COLONOSCOPY N/A 06/17/2015    Performed by Jolee Ewing, MD at [facility]   ? ROBOT ASSISTED LAPAROSCOPIC NEPHRECTOMY PARTIAL WITH INTRAOPERATIVE ULTRASOUND  Left 06/09/2017    Performed by Daryl Eastern, MD at [facility] OR   ? BIOPSY/ EXCISION LYMPH NODE DEEP CERVICAL Left 08/11/2020    Performed by Alpha Gula, MD at [facility] OR   ? HERNIA REPAIR     ? HX APPENDECTOMY     ? PR LAPAROSCOPY SURG RPR INITIAL INGUINAL HERNIA      right       Pertinent medical/surgical history reviewed    Social History:  Social History     Tobacco Use   ? Smoking status: Every Day     Packs/day: 1.00     Years: 36.00     Pack years: 36.00     Types: Cigarettes     Start date: 27   ? Smokeless tobacco: Never  Vaping Use   ? Vaping Use: Never used   Substance Use Topics   ? 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     Social History     Substance and Sexual Activity   Drug Use Yes   ? 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             Family History:  Family History   Problem Relation Age of Onset   ? Melanoma Father    ? Cancer Father         oral mandibular   ? High Cholesterol Mother    ? Cancer-Colon Maternal Aunt 70   ? Diabetes Neg Hx    ? Hypertension Neg Hx    ? Cancer-Breast Neg Hx    ? Cancer-Ovarian Neg Hx    ? Cancer-Prostate Neg Hx        Vitals:  ED Vitals    Date and Time T BP P RR SPO2P SPO2 User 05/31/21 1600 -- 114/68 104 14 PER MINUTE -- 96 % AS   05/31/21 1530 -- 106/56 106 20 PER MINUTE 106 97 % DL   60/45/40 9811 -- 914/78 106 20 PER MINUTE 105 98 % DL   29/56/21 3086 -- 578/46 107 23 PER MINUTE 107 97 % DL   96/29/52 8413 -- 244/01 104 29 PER MINUTE 104 98 % DL   02/72/53 6644 -- 034/74 107 24 PER MINUTE 107 97 % DL   25/95/63 8756 -- 433/29 -- -- 108 99 % DL   51/88/41 6606 -- 30/16 108 -- 107 98 % DL   01/26/30 3557 -- 32/20 108 -- 108 96 % DL   25/42/70 6237 -- 62/83 112 21 PER MINUTE 111 97 % DL   15/17/61 6073 -- -- 710 21 PER MINUTE 112 96 % DL   62/69/48 5462 -- 703/50 -- -- -- -- DL   09/38/18 2993 71.6 ?C (98.5 ?F) 117/57 116 17 PER MINUTE -- 100 % SB          Physical Exam:  Physical Exam  Constitutional:       Appearance: He is not ill-appearing.   HENT:      Head: Normocephalic.   Eyes:      Extraocular Movements: Extraocular movements intact.   Cardiovascular:      Rate and Rhythm: Regular rhythm. Tachycardia present.      Pulses: Normal pulses.      Heart sounds: Normal heart sounds. No murmur heard.  Pulmonary:      Effort: Pulmonary effort is normal.      Breath sounds: No wheezing, rhonchi or rales.   Abdominal:      General: Abdomen is flat. Bowel sounds are normal. There is no distension.      Tenderness: There is no abdominal tenderness.   Genitourinary:     Comments: Exam completed with chaperone present. Area on L gluteus with overlying warmth and erythema. Firmness with underlying fluctuance palpated. Area of erythema does not track to anus  Neurological:      Mental Status: He is alert.         Laboratory Results:  Labs Reviewed   CBC AND DIFF -  Abnormal       Result Value Ref Range Status    White Blood Cells 13.5 (*) 4.5 - 11.0 K/UL Final    RBC 4.74  4.4 - 5.5 M/UL Final    Hemoglobin 14.5  13.5 - 16.5 GM/DL Final    Hematocrit 47.8  40 - 50 % Final    MCV 93.2  80 - 100 FL Final    MCH 30.5  26 - 34 PG Final    MCHC 32.8  32.0 - 36.0 G/DL Final    RDW 29.5  11 - 15 % Final    Platelet Count 512 (*) 150 - 400 K/UL Final    MPV 7.7  7 - 11 FL Final    Neutrophils 76  41 - 77 % Final    Lymphocytes 13 (*) 24 - 44 % Final    Monocytes 10  4 - 12 % Final    Eosinophils 0  0 - 5 % Final    Basophils 1  0 - 2 % Final    Absolute Neutrophil Count 10.22 (*) 1.8 - 7.0 K/UL Final    Absolute Lymph Count 1.81  1.0 - 4.8 K/UL Final    Absolute Monocyte Count 1.32 (*) 0 - 0.80 K/UL Final    Absolute Eosinophil Count 0.04  0 - 0.45 K/UL Final    Absolute Basophil Count 0.08  0 - 0.20 K/UL Final    MDW (Monocyte Distribution Width) 24.8 (*) <20.7 Final   COMPREHENSIVE METABOLIC PANEL - Abnormal    Sodium 135 (*) 137 - 147 MMOL/L Final    Potassium 4.4  3.5 - 5.1 MMOL/L Final    Chloride 101  98 - 110 MMOL/L Final    Glucose 83  70 - 100 MG/DL Final    Blood Urea Nitrogen 12  7 - 25 MG/DL Final    Creatinine 6.21  0.4 - 1.24 MG/DL Final    Calcium 9.6  8.5 - 10.6 MG/DL Final    Total Protein 7.6  6.0 - 8.0 G/DL Final    Total Bilirubin 0.4  0.3 - 1.2 MG/DL Final    Albumin 4.0  3.5 - 5.0 G/DL Final    Alk Phosphatase 96  25 - 110 U/L Final    AST (SGOT) 16  7 - 40 U/L Final    CO2 27  21 - 30 MMOL/L Final    ALT (SGPT) 14  7 - 56 U/L Final    Anion Gap 7  3 - 12 Final    eGFR >60  >60 mL/min Final   URINALYSIS DIPSTICK REFLEX TO CULTURE - Abnormal    Color,UA YELLOW   Final    Turbidity,UA CLEAR  CLEAR-CLEAR Final    Specific Gravity-Urine >1.050 (*) 1.005 - 1.030 Final    pH,UA 6.0  5.0 - 8.0 Final    Protein,UA NEG  NEG-NEG Final    Glucose,UA NEG  NEG-NEG Final    Ketones,UA NEG  NEG-NEG Final    Bilirubin,UA NEG  NEG-NEG Final    Blood,UA NEG  NEG-NEG Final    Urobilinogen,UA NORMAL  NORM-NORMAL Final    Nitrite,UA NEG  NEG-NEG Final    Leukocytes,UA NEG  NEG-NEG Final    Urine Ascorbic Acid, UA NEG  NEG-NEG Final   CULTURE-BLOOD W/SENSITIVITY   CULTURE-BLOOD W/SENSITIVITY   POC LACTATE    LACTIC ACID POC 1.5  0.5 - 2.0 MMOL/L Final   URINALYSIS MICROSCOPIC REFLEX TO CULTURE  WBCs,UA 0-2 0 - 2 /HPF Final    RBCs,UA 2-10  0 - 3 /HPF Final    Comment,UA     Final    Value: Criteria for reflex to culture are WBC>10, Positive Nitrite, and/or >=+1   leukocytes. If quantity is not sufficient, an addendum will follow.      MucousUA TRACE   Final   UA GREY TOP TUBE   CLEAR TOP EXTRA URINE TUBE   CHLAM/NG PCR URINE   POC LACTATE   POC LACTATE     Hemoccult  Hemoccult: (S) Positive  QC: (S) Acceptable  Hem. Lot#: ([id number]) [id number] [id number] 04-25    Radiology Interpretation:    CT A/P with gluteal cellulitis without organized fluid collection    EKG:      Medical Decision Making:  Charles Walls is a 51 y.o. male who presents with chief complaint as listed above. Based on the history and presentation, the list of differential diagnoses considered included, but was not limited to, Gluteal cellulitis, Gluteal abscess, Peri-rectal abscess     ED Course  - Patient evaluated at bedside by resident and attending physician  - VS reviewed, notable for tachycardia. PE notable for area of erythema on L gluteus with firmness  - Labs ordered and reviewed, notable for WBC 13.5  - CT A/P ordered and reviewed, notable for L gluteal cellulitis without organized fluid collection  - Patient given IV fluid resuscitation with 30cc/kg and started on Rocephin, Doxy, Flagyl given high risk sexual behavior and initial concern for abscess  - Patient given Fentanyl and Morphine for pain control with adequate control while in the ED  - Given patient septic with leukocytosis and tachycardia, high risk for decompensation with HIV positive status, request to [facility] Medicine placed for admission for IV antibiotics for cellulitis  - Patient accepted to inpatient Family Medicine service       Complexity of Problems Addressed  Patient's active diagnoses as well as contributing pre-existing medical problems include:  Clinical Impression   Cellulitis of gluteal region     Evaluation performed for potential threat to life or bodily function during this visit given the initial differential diagnosis and clinical impression(s) as discussed previously in MDM/ED course.    Additional data reviewed:    ? History was obtained from an independent historian: Not in addition to what is mentioned above  ? Prior non-ED notes reviewed: Clinic note  ? Independent interpretation of diagnostic tests was performed by me: Not in addition to what is mentioned above  ? Patient presentation/management was discussed with the following qualified health care professionals and/or other relevant professionals: Not in addition to what is mentioned above and Admitting Physician    Risk evaluation:    ? Diagnosis or treatment of patient condition impacted by social determinant of health: Problems related to social environment  ? Tests Considered but not performed due to clinical scoring (if not mentioned in ED course, aside from what is implied by clinical scores listed): None  ? Rationale regarding whether admission or escalation of care considered if not performed (if not mentioned in ED course, aside from what is implied by clinical scores listed): None    ED Scoring:                                Facility Administered Meds:  Medications   senna/docusate (SENOKOT-S) tablet 1 tablet (has no administration in time range)  polyethylene glycol 3350 (MIRALAX) packet 17 g (has no administration in time range)   ondansetron (ZOFRAN ODT) rapid dissolve tablet 4 mg (has no administration in time range)     Or   ondansetron (ZOFRAN) injection 4 mg (has no administration in time range)   melatonin tablet 5 mg (has no administration in time range)   acetaminophen (TYLENOL) tablet 650 mg (has no administration in time range)   fentaNYL citrate PF (SUBLIMAZE) injection 25 mcg (25 mcg Intravenous Given 05/31/21 1144)   lactated ringers infusion (0 mL Intravenous Infusion Stopped 05/31/21 1439)   cefTRIAXone (ROCEPHIN) IVP 1 g (1 g Intravenous Given 05/31/21 1323)   metroNIDAZOLE (FLAGYL) 500 mg IVPB 100 mL (0 mg Intravenous Infusion Stopped 05/31/21 1425)   doxycycline (VIBRAMYCIN) 100 mg in dextrose 5% (D5W) 100 mL IVPB (0 mg Intravenous Infusion Stopped 05/31/21 1607)   iohexoL (OMNIPAQUE-350) 350 mg/mL injection 80 mL (80 mL Intravenous Given 05/31/21 1304)   sodium chloride PF 0.9% injection 50 mL (50 mL Intravenous Given 05/31/21 1304)   morphine injection 4 mg (4 mg Intravenous Given 05/31/21 1328)       Clinical Impression:  Clinical Impression   Cellulitis of gluteal region       Disposition/Follow up  ED Disposition     ED Disposition   Admit           No follow-up provider specified.    Medications:  Current Discharge Medication List          Procedure Notes:  Procedures       Attestation / Supervision:        Michael Boston, MD  PGY-2 Internal Medicine

## 2021-05-31 NOTE — Progress Notes
Report called to Elmyra Ricks, RN on HC809/01. Receiving nurse denies any questions, comments or concerns at this time. Transportation request placed.

## 2021-05-31 NOTE — ED Notes
Pt arrived to the ED A&Ox4 with a CC of abscess/buttock pain which started x 3 days. Pt report pain, swelling, and redness to left buttock x 3. Pt denies known injuries or use of injectable medication or drugs. Pt reports he noticed a "pimple-like" bump x 3 days ago that has since progressed.     Pt resting in bed, call light within reach, bed locked and in the lowest position.

## 2021-05-31 NOTE — H&P (View-Only)
Admission History and Physical Examination  Family Medicine Inpatient Service       Name:  Charles Walls                                             MRN:  [id number]   Admission Date:  05/31/2021    Assessment/Plan                      Principal Problem:    Cellulitis of buttock, left  Active Problems:    HIV (human immunodeficiency virus infection) (HCC)    Tobacco abuse    Alcohol use disorder, moderate, in sustained remission (HCC)    Renal cell cancer, left (HCC)    Renal cell carcinoma of left kidney (HCC)    Methamphetamine use disorder, mild (HCC)    Charles Walls is a 51-yo male with a medical history significant for HIV, h/o RCC s/p left partial nephrectomy (2019), alcohol use disorder, tobacco abuse, IV drug abuse/methamphetamine use disorder who presented to [facility] ED for increased pain on his left buttocks with associated induration and surrounding erythema.    Sepsis 2/2 Cellulitis of left buttock  3/4 SIRS   Leukocytosis  - Pt noticed a pimple-like bump on L buttock 3-4 days ago, today it was immensely painful with a large indurated area with surrounding erythema   - In 01/2021, was admitted for a L wrist cyst rupture which developed an infection and multiple DVTs of L arm  - Reports fevers for last 2 days, resolved today (Tmax reported 100F)  - Tachycardic on admission  - WBCs 13.5  - LA 1.5  - CT abd/pelv: Left gluteal cellulitis with associated reactive lymphadenopathy. No organized fluid collection is seen to suggest an associated abscess  - UA: NEG  - S/p doxycycline 100mg , Rocephin 1g, and metronidazole 500 mg IV in ED  Plan:  > Start Zosyn 5/14 for coverage of aneurobes and gram neg bacteria in setting of close proximity to anus  > Continue doxycycline 100 mg BID PO for coverage of possible MRSA - low threshold to escalate to vancomycin in setting of worsening clinical picture and clindamycin for toxin stabilization   > F/u BCx  > F/u chlam/NG urine  > Daily CBC - trend WBCs    HIV  - Pt reports being diagnosed in 2000  - Has been well-controlled, medication compliant  - Last CD4 01/2021: 724  - HIV-1 copies 02/20/21: <1.3 copies; < 20 RNA  Plan:  > Continue PTA abacavir 600 mg + dolutegravir 50 mg + lamivudine 300 mg    H/o LUE DVT  > PTA Eliquis     Tobacco abuse  Alcohol use disorder  Methamphetamine use disorder   - Pt states that he has not used methamphetamine or IV drugs for the last year  - Reports a 35 pack-year hx smoking  Plan:  > Nicotine patch  > Tobacco cessation team    H/o renal cell carcinoma  No acute issues   - s/p left robotic assisted laparoscopic partial nephrectomy for 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  Barriers to discharge: Not medically stable for discharge. Too soon to confirm.      Patient to be discussed with attending physician, Dr. Greer Pickerel.    Collene Gobble, MD  Family Medicine Inpatient Service  [facility]   On Voalte,Team Pager: [phone]     __________________________________________________________________________________    History of Present Illness     Primary Care Physician: Theodoro Grist    Chief Complaint:  Buttocks skin infection  History of Present Illness: Charles Walls is a 51 y.o. male who presented to [place] ED for a painful skin infection on his left buttocks. He reports that 3-4 days ago he noticed a pimple-like bump on his L buttocks. Today he felt a lump and saw surrounding erythema, no drainage and his pain increased substantially. Only other occurrence of abscess-like infection was a wrist cyst that ruptured in January 2023, which got infected and he was hospitalized for  6 days of abx treatment. Had L arm DVTs during wrist cyst event, was started on Eliquis. He was an IV drug use but has not used in the last year. Does not currently use IV or other illicit drugs. Denies hemorrhoids, anal fissures, bloody bowel movements, N/V.    Reports a cough for the last couple of weeks, produces a little sputum, says its different than his smokers cough. Reports very mild SOB while he walks for the last couple of weeks, same as cough onset. 35 pack year hx of smoking.     To note, HIV diagnosis 23 years ago, well-controlled, medication compliant.     Medical History:   Diagnosis Date   ? Diverticulitis 02/02/2017   ? Fracture cervical vertebra-closed (HCC)     c6, c7 - no surgical repair   ? Gastroesophageal reflux disease without esophagitis 10/05/2019    Ordered omeprazole 40mg  qday before breakfast.  Discussed lifestyle modifications. No current red flag symptoms.  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 Human immunodeficiency virus (HIV) disease (HCC)    ? Hyperlipidemia 06/13/2013   ? Psoriasis, unspecified 02/21/2014   ? Sepsis (HCC) 03/20/2019   ? Suicide attempt [facility])      Surgical History:   Procedure Laterality Date   ? COLONOSCOPY N/A 06/17/2015    Performed by Jolee Ewing, MD at [facility] ENDO   ? ROBOT ASSISTED LAPAROSCOPIC NEPHRECTOMY PARTIAL WITH INTRAOPERATIVE ULTRASOUND  Left 06/09/2017    Performed by Daryl Eastern, MD at [facility] OR   ? BIOPSY/ EXCISION LYMPH NODE DEEP CERVICAL Left 08/11/2020    Performed by Alpha Gula, MD at [facility] OR   ? HERNIA REPAIR     ? HX APPENDECTOMY     ? PR LAPAROSCOPY SURG RPR INITIAL INGUINAL HERNIA      right     Family History   Problem Relation Age of Onset   ? Melanoma Father    ? Cancer Father         oral mandibular   ? High Cholesterol Mother    ? Cancer-Colon Maternal Aunt 70   ? Diabetes Neg Hx    ? Hypertension Neg Hx    ? Cancer-Breast Neg Hx    ? Cancer-Ovarian Neg Hx    ?  Cancer-Prostate Neg Hx      Social History     Tobacco Use   ? Smoking status: Every Day     Packs/day: 1.00     Years: 36.00     Pack years: 36.00     Types: Cigarettes     Start date: 62   ? Smokeless tobacco: Never   Vaping Use   ? Vaping Use: Never used   Substance and Sexual Activity   ? 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   ? Sexual activity: Yes     Partners: Male             Immunizations (includes history and patient reported):   Immunization History   Administered Date(s) Administered   ? COVID-19 (PFIZER), mRNA vacc, 30 mcg/0.3 mL (PF) 05/07/2019, 05/28/2019   ? Flu Vaccine =>6 Months Quadrivalent PF 11/18/2020   ? Flu Vaccine =>52 YO High-Dose (PF) 02/11/2016, 11/24/2016   ? Flu Vaccine =>52 YO High-Dose Quadrivalent (PF) 02/07/2019   ? Flu Vaccine Trivalent =>3 Yo (Preservative Free) 10/04/2013   ? Hepatitis B Vaccine Adult 3 Dose IM 10/01/2013   ? Meningococcal Conjug Vaccine IM (MenACWY-CRM)(Menveo) 06/18/2015, 02/11/2016   ? Pneumococcal Vaccine (23-Val Adult) 10/01/2013   ? Pneumococcal Vaccine(13-Val Peds/immunocompromised adult) 07/31/2013   ? Smallpox and Monkeypox Vaccine Live PF ID 09/19/2020, 02/20/2021   ? Tdap Vaccine 05/11/2013, 08/09/2019           Allergies:  Sulfa (sulfonamide antibiotics)    Medications:  (Not in a hospital admission)      Review of Systems:  Review of Systems   Constitutional: Positive for fever. Negative for chills, malaise/fatigue and weight loss.   Respiratory: Positive for cough and shortness of breath. Negative for hemoptysis and wheezing.    Cardiovascular: Negative.    Gastrointestinal: Negative.    Genitourinary: Negative.    Neurological: Negative.    Psychiatric/Behavioral: Negative.        Physical Exam:  Vital Signs: Last Filed In 24 Hours Vital Signs: 24 Hour Range   BP: 114/68 (05/14 1600)  Temp: 36.9 ?C (98.5 ?F) (05/14 1100)  Pulse: 104 (05/14 1600)  Respirations: 14 PER MINUTE (05/14 1600)  SpO2: 96 % (05/14 1600)  SpO2 Pulse: 106 (05/14 1530)  Height: 162.6 cm (5' 4) (05/14 1100) BP: (91-127)/(53-68)   Temp:  [36.9 ?C (98.5 ?F)]   Pulse:  [104-116]   Respirations:  [14 PER MINUTE-29 PER MINUTE]   SpO2:  [96 %-100 %]    Intensity Pain Scale (Self Report): 7 (05/31/21 1108)      Physical Exam  Constitutional:       Appearance: Normal appearance.   HENT:      Head: Normocephalic and atraumatic.   Cardiovascular:      Rate and Rhythm: Regular rhythm. Tachycardia present.      Pulses: Normal pulses.      Heart sounds: Normal heart sounds.   Pulmonary:      Effort: Pulmonary effort is normal.      Breath sounds: Normal breath sounds.   Abdominal:      General: Abdomen is flat.      Palpations: Abdomen is soft.   Musculoskeletal:         General:  Normal range of motion.   Skin:     General: Skin is warm and dry.      Findings: Erythema present.          Neurological:      General: No focal deficit present.      Mental Status: He is alert and oriented to person, place, and time.   Psychiatric:         Mood and Affect: Mood normal.         Behavior: Behavior normal.         Thought Content: Thought content normal.         Judgment: Judgment normal.         Lab/Radiology/Other Diagnostic Tests:  24-hour labs:    Results for orders placed or performed during the hospital encounter of 05/31/21 (from the past 24 hour(s))   CBC AND DIFF    Collection Time: 05/31/21 11:29 AM   Result Value Ref Range    White Blood Cells 13.5 (H) 4.5 - 11.0 K/UL    RBC 4.74 4.4 - 5.5 M/UL    Hemoglobin 14.5 13.5 - 16.5 GM/DL    Hematocrit 16.1 40 - 50 %    MCV 93.2 80 - 100 FL    MCH 30.5 26 - 34 PG    MCHC 32.8 32.0 - 36.0 G/DL    RDW 09.6 11 - 15 %    Platelet Count 512 (H) 150 - 400 K/UL    MPV 7.7 7 - 11 FL    Neutrophils 76 41 - 77 %    Lymphocytes 13 (L) 24 - 44 %    Monocytes 10 4 - 12 %    Eosinophils 0 0 - 5 %    Basophils 1 0 - 2 %    Absolute Neutrophil Count 10.22 (H) 1.8 - 7.0 K/UL    Absolute Lymph Count 1.81 1.0 - 4.8 K/UL    Absolute Monocyte Count 1.32 (H) 0 - 0.80 K/UL    Absolute Eosinophil Count 0.04 0 - 0.45 K/UL    Absolute Basophil Count 0.08 0 - 0.20 K/UL    MDW (Monocyte Distribution Width) 24.8 (H) <20.7   COMPREHENSIVE METABOLIC PANEL    Collection Time: 05/31/21 11:29 AM   Result Value Ref Range    Sodium 135 (L) 137 - 147 MMOL/L    Potassium 4.4 3.5 - 5.1 MMOL/L    Chloride 101 98 - 110 MMOL/L    Glucose 83 70 - 100 MG/DL    Blood Urea Nitrogen 12 7 - 25 MG/DL    Creatinine 0.45 0.4 - 1.24 MG/DL    Calcium 9.6 8.5 - 40.9 MG/DL    Total Protein 7.6 6.0 - 8.0 G/DL    Total Bilirubin 0.4 0.3 - 1.2 MG/DL    Albumin 4.0 3.5 - 5.0 G/DL    Alk Phosphatase 96 25 - 110 U/L    AST (SGOT) 16 7 - 40 U/L    CO2 27 21 - 30 MMOL/L    ALT (SGPT) 14 7 - 56 U/L    Anion Gap 7 3 - 12    eGFR >60 >60 mL/min   POC LACTATE    Collection Time: 05/31/21 11:32 AM   Result Value Ref Range    LACTIC ACID POC 1.5 0.5 - 2.0 MMOL/L   URINALYSIS DIPSTICK REFLEX TO CULTURE    Collection Time: 05/31/21  3:00 PM    Specimen: Urine   Result Value Ref Range  Color,UA YELLOW     Turbidity,UA CLEAR CLEAR-CLEAR    Specific Gravity-Urine >1.050 (H) 1.005 - 1.030    pH,UA 6.0 5.0 - 8.0    Protein,UA NEG NEG-NEG    Glucose,UA NEG NEG-NEG    Ketones,UA NEG NEG-NEG    Bilirubin,UA NEG NEG-NEG    Blood,UA NEG NEG-NEG    Urobilinogen,UA NORMAL NORM-NORMAL    Nitrite,UA NEG NEG-NEG    Leukocytes,UA NEG NEG-NEG    Urine Ascorbic Acid, UA NEG NEG-NEG   URINALYSIS MICROSCOPIC REFLEX TO CULTURE    Collection Time: 05/31/21  3:00 PM    Specimen: Urine   Result Value Ref Range    WBCs,UA 0-2 0 - 2 /HPF    RBCs,UA 2-10 0 - 3 /HPF    Comment,UA       Criteria for reflex to culture are WBC>10, Positive Nitrite, and/or >=+1   leukocytes. If quantity is not sufficient, an addendum will follow.      MucousUA TRACE      Glucose: 83 (05/31/21 1129)  Hemoccult: (S) Positive (05/31/21 1151)    CT ABD/PELV W CONTRAST   Final Result         Left gluteal cellulitis with associated reactive lymphadenopathy. No organized fluid collection is seen to suggest an associated abscess.          Finalized by Prince Rome, D.O. on 05/31/2021 2:25 PM. Dictated by Prince Rome, D.O. on 05/31/2021 2:19 PM.             Collene Gobble, MD  Team Pager: [phone]

## 2021-06-01 ENCOUNTER — Inpatient Hospital Stay: Admit: 2021-06-01 | Discharge: 2021-06-01 | Payer: No Typology Code available for payment source

## 2021-06-01 ENCOUNTER — Encounter: Admit: 2021-06-01 | Discharge: 2021-06-01 | Payer: No Typology Code available for payment source

## 2021-06-01 MED ADMIN — SODIUM CHLORIDE 0.9 % IV PGBK (MB+) [95161]: 2 g | INTRAVENOUS | @ 21:00:00 | NDC 00338915930

## 2021-06-01 MED ADMIN — VANCOMYCIN 1250MG IN 0.9% NACL IVPB (BATCHED) [213734]: 1250 mg | INTRAVENOUS | @ 21:00:00 | Stop: 2021-06-01 | NDC 54029433009

## 2021-06-01 MED ADMIN — OXYCODONE 5 MG PO TAB [10814]: 5 mg | ORAL | @ 21:00:00 | NDC 68084035411

## 2021-06-01 MED ADMIN — NICOTINE 14 MG/24 HR TD PT24 [27862]: 1 | TRANSDERMAL | @ 22:00:00 | NDC 60505706200

## 2021-06-01 MED ADMIN — METRONIDAZOLE 500 MG PO TAB [5016]: 500 mg | ORAL | @ 21:00:00 | NDC 60687055011

## 2021-06-01 MED ADMIN — CEFEPIME 2 GRAM IJ SOLR [78195]: 2 g | INTRAVENOUS | @ 21:00:00 | NDC 60505614700

## 2021-06-02 ENCOUNTER — Inpatient Hospital Stay: Admit: 2021-06-02 | Discharge: 2021-06-02 | Payer: No Typology Code available for payment source

## 2021-06-02 ENCOUNTER — Encounter: Admit: 2021-06-02 | Discharge: 2021-06-02 | Payer: No Typology Code available for payment source

## 2021-06-02 DIAGNOSIS — A419 Sepsis, unspecified organism: Secondary | ICD-10-CM

## 2021-06-02 DIAGNOSIS — S129XXA Fracture of neck, unspecified, initial encounter: Secondary | ICD-10-CM

## 2021-06-02 DIAGNOSIS — T1491XA Suicide attempt, initial encounter: Secondary | ICD-10-CM

## 2021-06-02 DIAGNOSIS — B20 Human immunodeficiency virus [HIV] disease: Secondary | ICD-10-CM

## 2021-06-02 DIAGNOSIS — K219 Gastro-esophageal reflux disease without esophagitis: Secondary | ICD-10-CM

## 2021-06-02 DIAGNOSIS — K5792 Diverticulitis of intestine, part unspecified, without perforation or abscess without bleeding: Secondary | ICD-10-CM

## 2021-06-02 DIAGNOSIS — E785 Hyperlipidemia, unspecified: Secondary | ICD-10-CM

## 2021-06-02 DIAGNOSIS — L409 Psoriasis, unspecified: Secondary | ICD-10-CM

## 2021-06-02 MED ORDER — PROPOFOL INJ 10 MG/ML IV VIAL
INTRAVENOUS | 0 refills | Status: DC
Start: 2021-06-02 — End: 2021-06-02
  Administered 2021-06-02: 17:00:00 110 mg via INTRAVENOUS

## 2021-06-02 MED ORDER — ROCURONIUM 10 MG/ML IV SOLN
INTRAVENOUS | 0 refills | Status: DC
Start: 2021-06-02 — End: 2021-06-02
  Administered 2021-06-02: 17:00:00 40 mg via INTRAVENOUS

## 2021-06-02 MED ORDER — DEXAMETHASONE SODIUM PHOSPHATE 4 MG/ML IJ SOLN
INTRAVENOUS | 0 refills | Status: DC
Start: 2021-06-02 — End: 2021-06-02
  Administered 2021-06-02: 17:00:00 4 mg via INTRAVENOUS

## 2021-06-02 MED ORDER — FENTANYL CITRATE (PF) 50 MCG/ML IJ SOLN
INTRAVENOUS | 0 refills | Status: DC
Start: 2021-06-02 — End: 2021-06-02
  Administered 2021-06-02: 17:00:00 100 ug via INTRAVENOUS

## 2021-06-02 MED ORDER — HYDROMORPHONE (PF) 2 MG/ML IJ SYRG
INTRAVENOUS | 0 refills | Status: DC
Start: 2021-06-02 — End: 2021-06-02
  Administered 2021-06-02: 17:00:00 .5 mg via INTRAVENOUS

## 2021-06-02 MED ORDER — LIDOCAINE (PF) 200 MG/10 ML (2 %) IJ SYRG
INTRAVENOUS | 0 refills | Status: DC
Start: 2021-06-02 — End: 2021-06-02
  Administered 2021-06-02: 17:00:00 60 mg via INTRAVENOUS

## 2021-06-02 MED ORDER — ONDANSETRON HCL (PF) 4 MG/2 ML IJ SOLN
INTRAVENOUS | 0 refills | Status: DC
Start: 2021-06-02 — End: 2021-06-02
  Administered 2021-06-02: 17:00:00 4 mg via INTRAVENOUS

## 2021-06-02 MED ORDER — SUGAMMADEX 100 MG/ML IV SOLN
INTRAVENOUS | 0 refills | Status: DC
Start: 2021-06-02 — End: 2021-06-02
  Administered 2021-06-02: 17:00:00 200 mg via INTRAVENOUS

## 2021-06-02 MED ORDER — ARTIFICIAL TEARS (PF) SINGLE DOSE DROPS GROUP
OPHTHALMIC | 0 refills | Status: DC
Start: 2021-06-02 — End: 2021-06-02
  Administered 2021-06-02: 17:00:00 2 [drp] via OPHTHALMIC

## 2021-06-02 MED ADMIN — NICOTINE 14 MG/24 HR TD PT24 [27862]: 1 | TRANSDERMAL | @ 14:00:00 | NDC 60505706200

## 2021-06-02 MED ADMIN — SODIUM CHLORIDE 0.9 % IV PGBK (MB+) [95161]: 2 g | INTRAVENOUS | @ 19:00:00 | NDC 00338915930

## 2021-06-02 MED ADMIN — METRONIDAZOLE 500 MG PO TAB [5016]: 500 mg | ORAL | @ 02:00:00 | NDC 60687055011

## 2021-06-02 MED ADMIN — OXYCODONE 5 MG PO TAB [10814]: 5 mg | ORAL | @ 10:00:00 | NDC 00406055223

## 2021-06-02 MED ADMIN — SODIUM CHLORIDE 0.9 % IV PGBK (MB+) [95161]: 2 g | INTRAVENOUS | @ 02:00:00 | NDC 00338915930

## 2021-06-02 MED ADMIN — CEFEPIME 2 GRAM IJ SOLR [78195]: 2 g | INTRAVENOUS | @ 02:00:00 | NDC 60505614700

## 2021-06-02 MED ADMIN — CEFEPIME 2 GRAM IJ SOLR [78195]: 2 g | INTRAVENOUS | @ 10:00:00 | NDC 60505614700

## 2021-06-02 MED ADMIN — VANCOMYCIN 0.75G IN 0.9% NACL IVPB (BATCHED) [213736]: 750 mg | INTRAVENOUS | @ 11:00:00 | NDC 54029433209

## 2021-06-02 MED ADMIN — LACTATED RINGERS IV SOLP [4318]: 1000 mL | INTRAVENOUS | @ 16:00:00 | Stop: 2021-06-02 | NDC 00338011704

## 2021-06-02 MED ADMIN — METRONIDAZOLE 500 MG PO TAB [5016]: 500 mg | ORAL | @ 14:00:00 | NDC 60687055011

## 2021-06-02 NOTE — Anesthesia Post-Procedure Evaluation
Post-Anesthesia Evaluation    Name: Charles Walls      MRN: [id number]     DOB: 1970-06-18     Age: 51 y.o.     Sex: male   __________________________________________________________________________     Procedure Information     Anesthesia Start Date/Time: 06/02/21 1146    Procedure: INCISION AND DRAINAGE ISCHIORECTAL/ PERIRECTAL ABSCESS (Anus)    Location: MAIN OR 60 / Main OR/Periop    Surgeons: Sharlyn Bologna, MD          Post-Anesthesia Vitals  BP: 92/56 (05/16 1300)  Temp: 37.2 C (99 F) (05/16 1232)  Pulse: 91 (05/16 1300)  Respirations: 12 PER MINUTE (05/16 1300)  SpO2: 96 % (05/16 1300)  SpO2 Pulse: 91 (05/16 1300)  O2 Device: None (Room air) (05/16 1300)   Vitals Value Taken Time   BP 92/56 06/02/21 1300   Temp 37.2 C (99 F) 06/02/21 1232   Pulse 91 06/02/21 1300   Respirations 12 PER MINUTE 06/02/21 1300   SpO2 96 % 06/02/21 1300   O2 Device None (Room air) 06/02/21 1300   ABP     ART BP           Post Anesthesia Evaluation Note    Evaluation location: Pre/Post  Patient participation: recovered; patient participated in evaluation  Level of consciousness: alert  Pain management: adequate    Hydration: normovolemia  Temperature: 36.0C - 38.4C  Airway patency: adequate    Perioperative Events       Post-op nausea and vomiting: no PONV    Postoperative Status  Cardiovascular status: hemodynamically stable  Respiratory status: spontaneous ventilation  Follow-up needed: none        Perioperative Events  There were no known notable events for this encounter.

## 2021-06-02 NOTE — Anesthesia Pre-Procedure Evaluation
Anesthesia Pre-Procedure Evaluation    Name: Charles Walls      MRN: [id number]     DOB: [birth year]     Age: 51 y.o.     Sex: male   __________________________________________________________________________     Procedure Date: 06/02/2021   Procedure: TRANSESOPHAGEAL ECHOCARDIOGRAM   Physical Assessment  Vital Signs (last filed in past 24 hours):  BP: 104/53 (05/16 0853)  Temp: 36.7 ?C (98.1 ?F) (05/16 4540)  Pulse: 87 (05/16 0853)  Respirations: 18 PER MINUTE (05/16 0853)  SpO2: 99 % (05/16 0853)  O2 Device: None (Room air) (05/16 9811)      Patient History  Allergies   Allergen Reactions   ? Sulfa (Sulfonamide Antibiotics) SEE COMMENTS     Patient reports that his father and brother are allergic, he is unsure so he reports it as an allergy.         Current Medications    Medication Directions   acetaminophen (TYLENOL EXTRA STRENGTH) 500 mg tablet Take one tablet to two tablets by mouth every 6 hours as needed for Pain. Max of 4,000 mg of acetaminophen in 24 hours.   atorvastatin (LIPITOR) 10 mg tablet Take one tablet by mouth daily.   cyanocobalamin (vitamin B-12) 100 mcg tablet Take one tablet by mouth daily.   ELIQUIS 5 mg tablet Take 1 Tablet by mouth 2 (two) times daily after completion of starter pack.   sildenafiL (VIAGRA) 50 mg tablet Take one tablet by mouth as Needed for Erectile dysfunction.   TRIUMEQ 600-50-300 mg tablet Take 1 Tablet by mouth once daily   valACYclovir (VALTREX) 1 gram tablet Take 2 Tablets by mouth every 12 (twelve) hours for 4 days for outbreak         Review of Systems/Medical History      Patient summary reviewed  Pertinent labs reviewed  Difficult IV access    PONV Screening: Non-smoker  No history of anesthetic complications  No family history of anesthetic complications      Airway - negative        Previous grade 1/2a view        Pulmonary      Current smoker (1/2 ppd); patient did not smoke on day of surgery          tobacco use        No indications/hx of asthma    no COPD      Cardiovascular         Exercise tolerance: >4 METS      Beta Blocker therapy: No      Beta blockers within 24 hours: n/a      Hyperlipidemia      Mild-moderate aortic regurgitation      GI/Hepatic/Renal         No GERD,       No liver disease:        Renal disease (partial nephrectomy Cr 0.99):        No electrolyte problems      L renal mass      Neuro/Psych       No seizures      Substance use, alcohol and methamphetamines        Psychiatric history          Depression      Musculoskeletal         Fractures      C6 C7 fracture in 2006. Per pt self healed and no issues with range of motion.  Endocrine/Other       No diabetes      No hypothyroidism      No hyperthyroidism      Malignancy (renal cell carcinoma):    treated        HIV +  Buttock abscess with SIRS     Physical Exam    Airway Findings      Mallampati: I      TM distance: >3 FB      Neck ROM: full      Mouth opening: good      Airway patency: adequate    Dental Findings: Negative            Cardiovascular Findings: Negative      Rhythm: regular      Rate: normal    Pulmonary Findings: Negative      Breath sounds clear to auscultation.    Abdominal Findings: Negative      Not obese    Neurological Findings: Negative      Alert and oriented x 3       Diagnostic Tests  Hematology:   Lab Results   Component Value Date    HGB 12.8 06/02/2021    HCT 37.3 06/02/2021    PLTCT 423 06/02/2021    WBC 13.9 06/02/2021    NEUT 73 06/02/2021    ANC 10.15 06/02/2021    ALC 2.33 06/02/2021    MONA 8 06/02/2021    AMC 1.10 06/02/2021    EOSA 1 06/02/2021    ABC 0.18 06/02/2021    MCV 92.0 06/02/2021    MCH 31.5 06/02/2021    MCHC 34.2 06/02/2021    MPV 7.5 06/02/2021    RDW 13.4 06/02/2021         General Chemistry:   Lab Results   Component Value Date    NA 135 06/02/2021    K 4.1 06/02/2021    CL 103 06/02/2021    CO2 23 06/02/2021    GAP 9 06/02/2021    BUN 12 06/02/2021    CR 0.99 06/02/2021    GLU 98 06/02/2021    GLU 92 03/25/2005    CA 8.8 06/02/2021 ALBUMIN 3.3 06/02/2021    LACTIC 0.7 02/02/2017    MG 1.9 02/06/2021    TOTBILI 0.3 06/02/2021    PO4 3.7 02/06/2021      Coagulation:   Lab Results   Component Value Date    PT 12.5 01/14/2006    PTT 33.3 01/31/2014    INR 1.3 02/02/2017         Anesthesia Plan    ASA score: 3   Plan: general  Induction method: intravenous  NPO status: acceptable      Informed Consent  Anesthetic plan and risks discussed with patient.        Plan discussed with: resident and surgeon/proceduralist.

## 2021-06-03 ENCOUNTER — Encounter: Admit: 2021-06-03 | Discharge: 2021-06-03 | Payer: No Typology Code available for payment source

## 2021-06-03 ENCOUNTER — Inpatient Hospital Stay: Admit: 2021-06-03 | Discharge: 2021-06-02 | Payer: No Typology Code available for payment source

## 2021-06-03 ENCOUNTER — Encounter: Admit: 2021-06-03 | Discharge: 2021-06-02 | Payer: No Typology Code available for payment source

## 2021-06-03 MED ADMIN — METRONIDAZOLE 500 MG PO TAB [5016]: 500 mg | ORAL | @ 14:00:00 | Stop: 2021-06-03 | NDC 60687055011

## 2021-06-03 MED ADMIN — SODIUM CHLORIDE 0.9 % FLUSH [210767]: 10 mL | @ 14:00:00 | NDC 08290306546

## 2021-06-03 MED ADMIN — VANCOMYCIN 0.75G IN 0.9% NACL IVPB (BATCHED) [213736]: 750 mg | INTRAVENOUS | @ 11:00:00 | Stop: 2021-06-03 | NDC 54029433209

## 2021-06-03 MED ADMIN — OXYCODONE 5 MG PO TAB [10814]: 5 mg | ORAL | @ 21:00:00 | NDC 00406055223

## 2021-06-03 MED ADMIN — CEFEPIME 2 GRAM IJ SOLR [78195]: 2 g | INTRAVENOUS | @ 17:00:00 | Stop: 2021-06-03 | NDC 60505614700

## 2021-06-03 MED ADMIN — NICOTINE 14 MG/24 HR TD PT24 [27862]: 1 | TRANSDERMAL | @ 14:00:00 | NDC 60505706200

## 2021-06-03 MED ADMIN — SODIUM CHLORIDE 0.9 % IV PGBK (MB+) [95161]: 2 g | INTRAVENOUS | @ 10:00:00 | Stop: 2021-06-03 | NDC 00338915930

## 2021-06-03 MED ADMIN — SODIUM CHLORIDE 0.9 % IV PGBK (MB+) [95161]: 2 g | INTRAVENOUS | @ 17:00:00 | Stop: 2021-06-03 | NDC 00338915930

## 2021-06-03 MED ADMIN — CEFEPIME 2 GRAM IJ SOLR [78195]: 2 g | INTRAVENOUS | @ 10:00:00 | Stop: 2021-06-03 | NDC 60505614700

## 2021-06-04 ENCOUNTER — Encounter: Admit: 2021-06-04 | Discharge: 2021-06-04 | Payer: No Typology Code available for payment source

## 2021-06-04 DIAGNOSIS — S129XXA Fracture of neck, unspecified, initial encounter: Secondary | ICD-10-CM

## 2021-06-04 DIAGNOSIS — K5792 Diverticulitis of intestine, part unspecified, without perforation or abscess without bleeding: Secondary | ICD-10-CM

## 2021-06-04 DIAGNOSIS — A419 Sepsis, unspecified organism: Secondary | ICD-10-CM

## 2021-06-04 DIAGNOSIS — L409 Psoriasis, unspecified: Secondary | ICD-10-CM

## 2021-06-04 DIAGNOSIS — T1491XA Suicide attempt, initial encounter: Secondary | ICD-10-CM

## 2021-06-04 DIAGNOSIS — B20 Human immunodeficiency virus [HIV] disease: Secondary | ICD-10-CM

## 2021-06-04 DIAGNOSIS — K219 Gastro-esophageal reflux disease without esophagitis: Secondary | ICD-10-CM

## 2021-06-04 DIAGNOSIS — E785 Hyperlipidemia, unspecified: Secondary | ICD-10-CM

## 2021-06-04 MED ADMIN — DEXTROSE 5% IN WATER IV SOLP [2364]: 1000 mg | INTRAVENOUS | NDC 00338001702

## 2021-06-04 MED ADMIN — VANCOMYCIN 1,000 MG IV SOLR [8442]: 1000 mg | INTRAVENOUS | @ 11:00:00 | Stop: 2021-06-04 | NDC 00143935701

## 2021-06-04 MED ADMIN — SODIUM CHLORIDE 0.9 % FLUSH [210767]: 10 mL | @ 15:00:00 | NDC 08290306546

## 2021-06-04 MED ADMIN — DEXTROSE 5% IN WATER IV SOLP [2364]: 1000 mg | INTRAVENOUS | @ 11:00:00 | Stop: 2021-06-04 | NDC 00338001702

## 2021-06-04 MED ADMIN — VANCOMYCIN 1,000 MG IV SOLR [8442]: 1000 mg | INTRAVENOUS | NDC 00143935701

## 2021-06-05 ENCOUNTER — Encounter: Admit: 2021-06-05 | Discharge: 2021-06-05 | Payer: No Typology Code available for payment source

## 2021-06-05 MED ADMIN — SODIUM CHLORIDE 0.9 % FLUSH [210767]: 10 mL | @ 15:00:00 | Stop: 2021-06-05 | NDC 08290306546

## 2021-06-05 MED ADMIN — LINEZOLID 600 MG PO TAB [80084]: 600 mg | ORAL | @ 15:00:00 | Stop: 2021-06-05 | NDC 00904655304

## 2021-06-05 MED ADMIN — LINEZOLID 600 MG PO TAB [80084]: 600 mg | ORAL | @ 02:00:00 | Stop: 2021-06-15 | NDC 00904655304

## 2021-06-05 MED ADMIN — OXYCODONE 5 MG PO TAB [10814]: 5 mg | ORAL | @ 02:00:00 | NDC 68084035411

## 2021-06-05 MED FILL — LINEZOLID 600 MG PO TAB: 600 mg | ORAL | 10 days supply | Qty: 20 | Fill #1 | Status: CP

## 2021-06-08 ENCOUNTER — Encounter: Admit: 2021-06-08 | Discharge: 2021-06-08 | Payer: No Typology Code available for payment source

## 2021-06-08 NOTE — Telephone Encounter
Hospital Discharge Follow Up      Reached Patient:Yes  Patient Date of Birth: 02-13-1970     Admission Information:     Hospital Name: [facility]  Admission Date: 05/31/21  Discharge Date: 06/05/21    Admission Diagnosis: Cellulitis  Discharge Diagnosis: Cellulitis  Has there been a discharge within the last 30 days? No    Hospital Services: Unplanned  Today's call is 1(business) days post discharge    Brief Hospital Course   Charles Walls is a 4-yo male with a medical history significant for HIV, h/o RCC s/p left partial nephrectomy (2019), alcohol use disorder, tobacco abuse, IV drug abuse/methamphetamine use disorder who presented to [facility] ED for increased pain on his left buttocks with induration and surrounding erythema concerning for cellulitis and admitted for IV ABx. Patient received doxycyline, Rocephin and metronidazole in the ED. Imaging demonstrated L gluteal cellulitis and a developing abscess. ABx was escalated from Zosyn and doxycycline to cefepime, vancomycin and Flagyl for worsening leukocytosis and declining clinical course. Patient underwent I&D of perirectal abscess w/o fistula on 5/16. Wound Cx was positive for MRSA and ID was consulted for resistant MRSA. Blood Cx remained negative. Patient was transitioned to linezolid upon dicharge.  Patient was unable to be approved for home health, patient received wound care education and was able to demonstrate wound dressing change prior to discharge.  ?  PTA meds were continued for other chronic conditions throughout hospital admission.  ?  Discharge Instruction Review   Did patient receive and understand discharge instructions? Yes    Home Health ordered? No                 Agency name/telephone number: NA   Has Home Health agency contacted patient? No   Caregiver assistance in the home? No   Are there concerns regarding the patient's ADL'S? No  Is patient a fall risk? No    Special diet? No If yes, type: Reg      Medication Reconciliation    Changes to pre-hospital medications? Yes     START taking:  linezolid (ZYVOX)    Were new prescriptions filled?Yes  Meds reviewed and reconciled?Yes  ? acetaminophen (TYLENOL EXTRA STRENGTH) 500 mg tablet Take one tablet to two tablets by mouth every 6 hours as needed for Pain. Max of 4,000 mg of acetaminophen in 24 hours.   ? atorvastatin (LIPITOR) 10 mg tablet Take one tablet by mouth daily.   ? cyanocobalamin (vitamin B-12) 100 mcg tablet Take one tablet by mouth daily.   ? ELIQUIS 5 mg tablet Take 1 Tablet by mouth 2 (two) times daily after completion of starter pack.   ? linezolid (ZYVOX) 600 mg tablet Take one tablet by mouth twice daily for 10 days. Indications: skin and skin structure infection   ? sildenafiL (VIAGRA) 50 mg tablet Take one tablet by mouth as Needed for Erectile dysfunction.   ? TRIUMEQ 600-50-300 mg tablet Take 1 Tablet by mouth once daily   ? valACYclovir (VALTREX) 1 gram tablet Take 2 Tablets by mouth every 12 (twelve) hours for 4 days for outbreak         Understanding Condition   Having any current symptoms? No  Patient understands when to seek additional medical care? Yes   Other instructions provided : Follow up with PCP with any concerns       Scheduling Follow-up Appointment   Upcoming appointment date and time and with whom scheduled:   Future Appointments  Date Time Provider Department Center   06/16/2021  9:45 AM SUR[facility]ERY [id number]   06/24/2021  1:30 PM Rada Hay, APRN-NP [facility]     PCP appointment scheduled?Yes, Date: 06/11/21   PCP primary location: [facility]  Specialist appointment scheduled? Yes, with Gen Surgery 06/16/21  Both PCP and Specialist appointment scheduled: Yes  Is assistance with transportation needed?No   MyChart message sent? Active in MyChart. No message sent.     Olegario Shearer, RN

## 2021-06-09 ENCOUNTER — Encounter: Admit: 2021-06-09 | Discharge: 2021-06-09 | Payer: No Typology Code available for payment source

## 2021-06-09 NOTE — Progress Notes
Charles Walls is a 51 y.o. male.    Chief Complaint   Patient presents with   ? Emergency Room Follow up     Cellulitis - [place] [facility] on 05/31/2021   ? Imm/Inj     Shingrix and Pneumpnia - talk to provider   ? Physical     Scheduled on 06/24/2021 - patient wonders if her can have today    ? Labs Only     Not fasting   ? Paperwork     Patient need permission to be off Eliquis to have dental work done - patient states that we should have paperwork for this            Subjective:            HPI    Charles Walls presents to discuss the following:    Hospital Discharge Follow-Up:  Hospital Name: [facility]  Admission Date: 05/31/21   Admission Diagnosis: Cellulitis  Discharge Date: 06/05/21  Discharge Diagnosis: Cellulitis  Records available at the time of visit? YES    Presented to [facility] on 5/14 with complaint of left buttock pain with induration and erythema. Dx with cellulitis and admitted for IV antibiotics. Imaging showed a gluteal abscess and antibiotics were changed during hospitalization due to worsening leukocytosis. He underwent and I&D of perirectal abscess w/o fistual on 5/16. Culture +for MRSA, ID consulted. Discharged on linezolid. Not approved for home health but given instructions for wound care at home. F/U appt with general surgery on 5/30.   Today, he reports he is feeling much better. Pain has improved. No drainage for >24 hours. Continues to change dressing daily without difficulty. No fever. Continues to take linezolid as prescribed.     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 [place] of wrist showed a nonocclusive superficial thrombus of the left cephalic vein. He was on Lovenox during hospitalization and then transitioned to Eliquis. States he has continued to take the Eliquis as prescribed. Had a repeat US showing continued thrombus. States he has not been advised on how long he will need to take Eliquis or if repeat imaging is needed. He has upcoming dental work and is concerned about the Eliquis as he cannot take to have dental work done. No history of blood clots per patient.      Review of Systems   Constitutional: Negative for chills, fatigue and fever.   Eyes: Negative for visual disturbance.   Respiratory: Negative for cough, chest tightness, shortness of breath and wheezing.    Cardiovascular: Negative for chest pain and palpitations.   Skin: Positive for wound.   Neurological: Negative for dizziness, syncope, light-headedness, numbness and headaches.   All other systems reviewed and are negative.          Your Current Medications:       Instructions    acetaminophen (TYLENOL EXTRA STRENGTH) 500 mg tablet Take one tablet to two tablets by mouth every 6 hours as needed for Pain. Max of 4,000 mg of acetaminophen in 24 hours.    atorvastatin (LIPITOR) 10 mg tablet Take one tablet by mouth daily.    cyanocobalamin (vitamin B-12) 100 mcg tablet Take one tablet by mouth daily.    ELIQUIS 5 mg tablet Take 1 Tablet by mouth 2 (two) times daily after completion of starter pack.    linezolid (ZYVOX) 600 mg tablet Take one tablet by mouth twice  daily for 10 days. Indications: skin and skin structure infection    sildenafiL (VIAGRA) 50 mg tablet Take one tablet by mouth as Needed for Erectile dysfunction.    TRIUMEQ 600-50-300 mg tablet Take 1 Tablet by mouth once daily    valACYclovir (VALTREX) 1 gram tablet Take 2 Tablets by mouth every 12 (twelve) hours for 4 days for outbreak          There are no discontinued medications.      Objective:          Vitals:    06/10/21 1546   BP: 100/58   BP Source: Arm, Left Upper   Pulse: 59   Temp: 36.6 ?C (97.9 ?F)   SpO2: 96%   TempSrc: Oral   PainSc: Three   Weight: 64.4 kg (142 lb)   Height: 163 cm (5' 4.17)     Body mass index is 24.24 kg/m?Marland Kitchen   No LMP for male patient.      Physical Exam  Vitals and nursing note reviewed.   Constitutional:       Appearance: Normal appearance. HENT:      Head: Normocephalic and atraumatic.      Right Ear: External ear normal.      Left Ear: External ear normal.      Nose: Nose normal. No congestion or rhinorrhea.   Eyes:      Extraocular Movements: Extraocular movements intact.   Pulmonary:      Effort: Pulmonary effort is normal.   Musculoskeletal:         General: Normal range of motion.      Cervical back: Normal range of motion and neck supple.   Skin:     Coloration: Skin is not jaundiced or pale.      Findings: Abscess (see attached picture) present.   Neurological:      General: No focal deficit present.      Mental Status: He is alert and oriented to person, place, and time.   Psychiatric:         Mood and Affect: Mood normal.         Behavior: Behavior normal.         Thought Content: Thought content normal.         Judgment: Judgment normal.         Results for orders placed or performed during the hospital encounter of 05/31/21 (from the past 336 hour(s))   CULTURE-BLOOD W/SENSITIVITY    Specimen: Arm, Right; Blood   Result Value Ref Range    Battery Name BLOOD CULTURE     Report Status FINAL 06/06/2021     Specimen Description BLOOD ARM, RIGHT     Special Requests No special requests     Culture NO GROWTH 5 DAYS    CBC AND DIFF   Result Value Ref Range    White Blood Cells 13.5 (H) 4.5 - 11.0 K/UL    RBC 4.74 4.4 - 5.5 M/UL    Hemoglobin 14.5 13.5 - 16.5 GM/DL    Hematocrit 16.1 40 - 50 %    MCV 93.2 80 - 100 FL    MCH 30.5 26 - 34 PG    MCHC 32.8 32.0 - 36.0 G/DL    RDW 09.6 11 - 15 %    Platelet Count 512 (H) 150 - 400 K/UL    MPV 7.7 7 - 11 FL    Neutrophils 76 41 - 77 %    Lymphocytes 13 (L) 24 -  44 %    Monocytes 10 4 - 12 %    Eosinophils 0 0 - 5 %    Basophils 1 0 - 2 %    Absolute Neutrophil Count 10.22 (H) 1.8 - 7.0 K/UL    Absolute Lymph Count 1.81 1.0 - 4.8 K/UL    Absolute Monocyte Count 1.32 (H) 0 - 0.80 K/UL    Absolute Eosinophil Count 0.04 0 - 0.45 K/UL    Absolute Basophil Count 0.08 0 - 0.20 K/UL    MDW (Monocyte Distribution Width) 24.8 (H) <20.7   COMPREHENSIVE METABOLIC PANEL   Result Value Ref Range    Sodium 135 (L) 137 - 147 MMOL/L    Potassium 4.4 3.5 - 5.1 MMOL/L    Chloride 101 98 - 110 MMOL/L    Glucose 83 70 - 100 MG/DL    Blood Urea Nitrogen 12 7 - 25 MG/DL    Creatinine 1.61 0.4 - 1.24 MG/DL    Calcium 9.6 8.5 - 09.6 MG/DL    Total Protein 7.6 6.0 - 8.0 G/DL    Total Bilirubin 0.4 0.3 - 1.2 MG/DL    Albumin 4.0 3.5 - 5.0 G/DL    Alk Phosphatase 96 25 - 110 U/L    AST (SGOT) 16 7 - 40 U/L    CO2 27 21 - 30 MMOL/L    ALT (SGPT) 14 7 - 56 U/L    Anion Gap 7 3 - 12    eGFR >60 >60 mL/min   POC LACTATE   Result Value Ref Range    LACTIC ACID POC 1.5 0.5 - 2.0 MMOL/L   URINALYSIS DIPSTICK REFLEX TO CULTURE    Specimen: Urine   Result Value Ref Range    Color,UA YELLOW     Turbidity,UA CLEAR CLEAR-CLEAR    Specific Gravity-Urine >1.050 (H) 1.005 - 1.030    pH,UA 6.0 5.0 - 8.0    Protein,UA NEG NEG-NEG    Glucose,UA NEG NEG-NEG    Ketones,UA NEG NEG-NEG    Bilirubin,UA NEG NEG-NEG    Blood,UA NEG NEG-NEG    Urobilinogen,UA NORMAL NORM-NORMAL    Nitrite,UA NEG NEG-NEG    Leukocytes,UA NEG NEG-NEG    Urine Ascorbic Acid, UA NEG NEG-NEG   URINALYSIS MICROSCOPIC REFLEX TO CULTURE    Specimen: Urine   Result Value Ref Range    WBCs,UA 0-2 0 - 2 /HPF    RBCs,UA 2-10 0 - 3 /HPF    Comment,UA       Criteria for reflex to culture are WBC>10, Positive Nitrite, and/or >=+1   leukocytes. If quantity is not sufficient, an addendum will follow.      MucousUA TRACE    CHLAM/NG PCR URINE   Result Value Ref Range    Chlamydia Trachomatis Probe NEG NEG-NEG    Neisseria Gonorroeae PCR NEG NEG-NEG   CBC AND DIFF   Result Value Ref Range    White Blood Cells 17.7 (H) 4.5 - 11.0 K/UL    RBC 4.45 4.4 - 5.5 M/UL    Hemoglobin 13.7 13.5 - 16.5 GM/DL    Hematocrit 04.5 40 - 50 %    MCV 94.3 80 - 100 FL    MCH 30.8 26 - 34 PG    MCHC 32.7 32.0 - 36.0 G/DL    RDW 40.9 11 - 15 %    Platelet Count 441 (H) 150 - 400 K/UL    MPV 7.6 7 - 11 FL Neutrophils 70 41 - 77 %  Lymphocytes 19 (L) 24 - 44 %    Monocytes 9 4 - 12 %    Eosinophils 1 0 - 5 %    Basophils 1 0 - 2 %    Absolute Neutrophil Count 12.57 (H) 1.8 - 7.0 K/UL    Absolute Lymph Count 3.27 1.0 - 4.8 K/UL    Absolute Monocyte Count 1.64 (H) 0 - 0.80 K/UL    Absolute Eosinophil Count 0.10 0 - 0.45 K/UL    Absolute Basophil Count 0.09 0 - 0.20 K/UL   COMPREHENSIVE METABOLIC PANEL   Result Value Ref Range    Sodium 135 (L) 137 - 147 MMOL/L    Potassium 4.3 3.5 - 5.1 MMOL/L    Chloride 102 98 - 110 MMOL/L    Glucose 103 (H) 70 - 100 MG/DL    Blood Urea Nitrogen 13 7 - 25 MG/DL    Creatinine 1.61 0.4 - 1.24 MG/DL    Calcium 8.7 8.5 - 09.6 MG/DL    Total Protein 7.2 6.0 - 8.0 G/DL    Total Bilirubin 0.4 0.3 - 1.2 MG/DL    Albumin 3.6 3.5 - 5.0 G/DL    Alk Phosphatase 99 25 - 110 U/L    AST (SGOT) 28 7 - 40 U/L    CO2 24 21 - 30 MMOL/L    ALT (SGPT) 19 7 - 56 U/L    Anion Gap 9 3 - 12    eGFR >60 >60 mL/min   CBC AND DIFF   Result Value Ref Range    White Blood Cells 13.9 (H) 4.5 - 11.0 K/UL    RBC 4.05 (L) 4.4 - 5.5 M/UL    Hemoglobin 12.8 (L) 13.5 - 16.5 GM/DL    Hematocrit 04.5 (L) 40 - 50 %    MCV 92.0 80 - 100 FL    MCH 31.5 26 - 34 PG    MCHC 34.2 32.0 - 36.0 G/DL    RDW 40.9 11 - 15 %    Platelet Count 423 (H) 150 - 400 K/UL    MPV 7.5 7 - 11 FL    Neutrophils 73 41 - 77 %    Lymphocytes 17 (L) 24 - 44 %    Monocytes 8 4 - 12 %    Eosinophils 1 0 - 5 %    Basophils 1 0 - 2 %    Absolute Neutrophil Count 10.15 (H) 1.8 - 7.0 K/UL    Absolute Lymph Count 2.33 1.0 - 4.8 K/UL    Absolute Monocyte Count 1.10 (H) 0 - 0.80 K/UL    Absolute Eosinophil Count 0.18 0 - 0.45 K/UL    Absolute Basophil Count 0.18 0 - 0.20 K/UL   COMPREHENSIVE METABOLIC PANEL   Result Value Ref Range    Sodium 135 (L) 137 - 147 MMOL/L    Potassium 4.1 3.5 - 5.1 MMOL/L    Chloride 103 98 - 110 MMOL/L    Glucose 98 70 - 100 MG/DL    Blood Urea Nitrogen 12 7 - 25 MG/DL    Creatinine 8.11 0.4 - 1.24 MG/DL    Calcium 8.8 8.5 - 91.4 MG/DL    Total Protein 6.6 6.0 - 8.0 G/DL    Total Bilirubin 0.3 0.3 - 1.2 MG/DL    Albumin 3.3 (L) 3.5 - 5.0 G/DL    Alk Phosphatase 80 25 - 110 U/L    AST (SGOT) 18 7 - 40 U/L    CO2 23 21 - 30  MMOL/L    ALT (SGPT) 18 7 - 56 U/L    Anion Gap 9 3 - 12    eGFR >60 >60 mL/min   CULTURE-ANAEROBIC    Specimen: Perirectal Abscess; Tissue   Result Value Ref Range    Battery Name ANAEROBE CULTURE     Report Status FINAL 06/02/2021     Specimen Description FLOCKED [facility] ABSCESS     Special Requests No special requests     Culture INAPPROPRIATE SPECIMEN FOR ANAEROBIC CULTURE    CULTURE-WOUND/TISSUE/FLUID(AEROBIC ONLY)W/SENSITIVITY    Specimen: Perirectal Abscess; Tissue   Result Value Ref Range    Battery Name ROUTINE CULTURE     Report Status FINAL 06/03/2021     Specimen Description FLOCKED SWAB PERIRECTAL ABSCESS     Special Requests No special requests     Direct Gram Stain MODERATE  NEUTROPHILS       Direct Gram Stain MANY  RBC'S       Direct Gram Stain FEW  GRAM POSITIVE COCCI       Culture (A)      Moderate growth  METHICILLIN RESISTANT STAPHYLOCOCCUS AUREUS  Positive for PBP2A, indicating that isolate is MRSA         Susceptibility    Methicillin resistant staphylococcus aureus - MIC (MCG/ML), INTERPRETATION, PHX     Clindamycin >2 Resistant      Erythromycin >4 Resistant      Oxacillin >2 Resistant      Vancomycin 1 Susceptible      Tetracycline >8 Resistant      Trimethsulfa >4/76 Resistant      Gentamicin <=2 Susceptible      Rifampin <=0.5 Susceptible      Linezolid 1 Susceptible    GRAM STAIN    Specimen: Perirectal Abscess; Tissue   Result Value Ref Range    Battery Name Romie Minus STAIN     Report Status FINAL 06/02/2021     Specimen Description FLOCKED [facility] ABSCESS     Special Requests No special requests     Gram Stain MODERATE  NEUTROPHILS       Gram Stain MANY  RBC'S       Gram Stain FEW  GRAM POSITIVE COCCI      CULTURE-FUNGAL,OTHER    Specimen: Perirectal Abscess; Tissue   Result Value Ref Range    Battery Name FUNGUS CULTURE     Report Status PRELIMINARY 06/08/2021     Specimen Description FLOCKED [facility] ABSCESS     Special Requests No special requests     Culture NO GROWTH OF FUNGUS TO DATE    CBC AND DIFF   Result Value Ref Range    White Blood Cells 12.4 (H) 4.5 - 11.0 K/UL    RBC 4.15 (L) 4.4 - 5.5 M/UL    Hemoglobin 13.1 (L) 13.5 - 16.5 GM/DL    Hematocrit 16.1 (L) 40 - 50 %    MCV 92.0 80 - 100 FL    MCH 31.6 26 - 34 PG    MCHC 34.3 32.0 - 36.0 G/DL    RDW 09.6 11 - 15 %    Platelet Count 416 (H) 150 - 400 K/UL    MPV 7.5 7 - 11 FL    Neutrophils 73 41 - 77 %    Lymphocytes 17 (L) 24 - 44 %    Monocytes 8 4 - 12 %    Eosinophils 1 0 - 5 %    Basophils 1 0 - 2 %  Absolute Neutrophil Count 9.14 (H) 1.8 - 7.0 K/UL    Absolute Lymph Count 2.15 1.0 - 4.8 K/UL    Absolute Monocyte Count 0.94 (H) 0 - 0.80 K/UL    Absolute Eosinophil Count 0.07 0 - 0.45 K/UL    Absolute Basophil Count 0.09 0 - 0.20 K/UL   COMPREHENSIVE METABOLIC PANEL   Result Value Ref Range    Sodium 138 137 - 147 MMOL/L    Potassium 4.1 3.5 - 5.1 MMOL/L    Chloride 104 98 - 110 MMOL/L    Glucose 124 (H) 70 - 100 MG/DL    Blood Urea Nitrogen 14 7 - 25 MG/DL    Creatinine 5.40 0.4 - 1.24 MG/DL    Calcium 8.9 8.5 - 98.1 MG/DL    Total Protein 6.9 6.0 - 8.0 G/DL    Total Bilirubin 0.2 (L) 0.3 - 1.2 MG/DL    Albumin 3.5 3.5 - 5.0 G/DL    Alk Phosphatase 83 25 - 110 U/L    AST (SGOT) 14 7 - 40 U/L    CO2 25 21 - 30 MMOL/L    ALT (SGPT) 14 7 - 56 U/L    Anion Gap 9 3 - 12    eGFR >60 >60 mL/min   VANCOMYCIN TROUGH   Result Value Ref Range    Vancomycin Trough 8.5 (L) 10.0 - 20.0 MCG/ML   VANCOMYCIN 2HR POST DOSE   Result Value Ref Range    Vancomycin 2HR POST Dose 15.4 ug/mL   CBC AND DIFF   Result Value Ref Range    White Blood Cells 7.3 4.5 - 11.0 K/UL    RBC 4.13 (L) 4.4 - 5.5 M/UL    Hemoglobin 13.2 (L) 13.5 - 16.5 GM/DL    Hematocrit 19.1 (L) 40 - 50 %    MCV 92.0 80 - 100 FL    MCH 31.9 26 - 34 PG    MCHC 34.7 32.0 - 36.0 G/DL    RDW 47.8 11 - 15 %    Platelet Count 468 (H) 150 - 400 K/UL    MPV 7.7 7 - 11 FL    Neutrophils 44 41 - 77 %    Lymphocytes 42 24 - 44 %    Monocytes 9 4 - 12 %    Eosinophils 4 0 - 5 %    Basophils 1 0 - 2 %    Absolute Neutrophil Count 3.20 1.8 - 7.0 K/UL    Absolute Lymph Count 3.06 1.0 - 4.8 K/UL    Absolute Monocyte Count 0.65 0 - 0.80 K/UL    Absolute Eosinophil Count 0.31 0 - 0.45 K/UL    Absolute Basophil Count 0.04 0 - 0.20 K/UL   COMPREHENSIVE METABOLIC PANEL   Result Value Ref Range    Sodium 140 137 - 147 MMOL/L    Potassium 3.9 3.5 - 5.1 MMOL/L    Chloride 107 98 - 110 MMOL/L    Glucose 95 70 - 100 MG/DL    Blood Urea Nitrogen 16 7 - 25 MG/DL    Creatinine 2.95 0.4 - 1.24 MG/DL    Calcium 8.8 8.5 - 62.1 MG/DL    Total Protein 6.7 6.0 - 8.0 G/DL    Total Bilirubin 0.2 (L) 0.3 - 1.2 MG/DL    Albumin 3.1 (L) 3.5 - 5.0 G/DL    Alk Phosphatase 68 25 - 110 U/L    AST (SGOT) 15 7 - 40 U/L    CO2 24 21 -  30 MMOL/L    ALT (SGPT) 14 7 - 56 U/L    Anion Gap 9 3 - 12    eGFR >60 >60 mL/min   CBC AND DIFF   Result Value Ref Range    White Blood Cells 6.5 4.5 - 11.0 K/UL    RBC 4.33 (L) 4.4 - 5.5 M/UL    Hemoglobin 13.2 (L) 13.5 - 16.5 GM/DL    Hematocrit 45.4 40 - 50 %    MCV 93.4 80 - 100 FL    MCH 30.5 26 - 34 PG    MCHC 32.7 32.0 - 36.0 G/DL    RDW 09.8 11 - 15 %    Platelet Count 489 (H) 150 - 400 K/UL    MPV 7.4 7 - 11 FL    Neutrophils 38 (L) 41 - 77 %    Lymphocytes 44 24 - 44 %    Monocytes 12 4 - 12 %    Eosinophils 6 (H) 0 - 5 %    Basophils 0 0 - 2 %    Absolute Neutrophil Count 2.44 1.8 - 7.0 K/UL    Absolute Lymph Count 2.87 1.0 - 4.8 K/UL    Absolute Monocyte Count 0.78 0 - 0.80 K/UL    Absolute Eosinophil Count 0.38 0 - 0.45 K/UL    Absolute Basophil Count 0.02 0 - 0.20 K/UL   COMPREHENSIVE METABOLIC PANEL   Result Value Ref Range    Sodium 137 137 - 147 MMOL/L    Potassium 3.9 3.5 - 5.1 MMOL/L    Chloride 105 98 - 110 MMOL/L    Glucose 97 70 - 100 MG/DL    Blood Urea Nitrogen 18 7 - 25 MG/DL    Creatinine 1.19 0.4 - 1.24 MG/DL    Calcium 8.9 8.5 - 14.7 MG/DL    Total Protein 7.0 6.0 - 8.0 G/DL    Total Bilirubin 0.2 (L) 0.3 - 1.2 MG/DL    Albumin 3.3 (L) 3.5 - 5.0 G/DL    Alk Phosphatase 63 25 - 110 U/L    AST (SGOT) 18 7 - 40 U/L    CO2 26 21 - 30 MMOL/L    ALT (SGPT) 15 7 - 56 U/L    Anion Gap 6 3 - 12    eGFR >60 >60 mL/min   HIV VIRAL LOAD PCR QUANT   Result Value Ref Range    HIV-1, copies/mL 4,980 (A) HIVND [copies]/mL    HIV-1, Log10 copies/mL 3.70 (H) <1.30   RPR TITER FOR TREATMENT   Result Value Ref Range    RPR Treatment REACTIVE (A) NR-NONREACTIVE   RPR TITER-QUANT   Result Value Ref Range    RPR Titer 2              Assessment and Plan:           [name] was seen today for emergency room follow up, imm/inj, physical, labs only and paperwork.    Diagnoses and all orders for this visit:    Hospital discharge follow-up  Perirectal abscess  MRSA (methicillin resistant staph aureus) culture positive  Reviewed hospital course, discharge summary, and diagnostic testing.   Exam reveals healing abscess without noted skin induration or drainage.   Pending general surgery appt on 5/30.   He will continue antibiotic through full course.   Reviewed s/sx to warrant return to clinic.    Cephalic vein thrombosis, left  -     AMB REFERRAL TO HEMATOLOGY  ONCOLOGY  Anticoagulated  -     AMB REFERRAL TO HEMATOLOGY ONCOLOGY  Noted during hospitalization in January 2023.   Repeat US in March revealed continued thrombus.   Unsure of long term plan for anticoagulation. For this reason, refer to hematology to determine if patient needs continued anticoagulation or further work-up.      I reviewed with the patient their current medications and specifically any new medications prescribed at the time of this visit and we reviewed the expected benefits and potential side effects. All questions are answered to the patient's satisfaction.    Patient education provided regarding diagnosis, course, and treatment. Patient was in agreement to instructions, POC, and will call if questions or concerns arise.     Total Time Today was 40 minutes in the following activities: Preparing to see the patient, Obtaining and/or reviewing separately obtained history, Performing a medically appropriate examination and/or evaluation, Counseling and educating the patient/family/caregiver, Ordering medications, tests, or procedures, Referring and communication with other health care professionals (when not separately reported), Documenting clinical information in the electronic or other health record and Independently interpreting results (not separately reported) and communicating results to the patient/family/caregiver        Return for scheduled physical in June.      Future Appointments   Date Time Provider Department Center   06/16/2021  9:45 AM SUR[facility]ERY [id number]   06/24/2021  1:30 PM Rada Hay, APRN-NP [facility]

## 2021-06-10 ENCOUNTER — Encounter: Admit: 2021-06-10 | Discharge: 2021-06-10 | Payer: No Typology Code available for payment source

## 2021-06-10 ENCOUNTER — Ambulatory Visit: Admit: 2021-06-10 | Discharge: 2021-06-10 | Payer: No Typology Code available for payment source

## 2021-06-10 DIAGNOSIS — K5792 Diverticulitis of intestine, part unspecified, without perforation or abscess without bleeding: Secondary | ICD-10-CM

## 2021-06-10 DIAGNOSIS — Z09 Encounter for follow-up examination after completed treatment for conditions other than malignant neoplasm: Secondary | ICD-10-CM

## 2021-06-10 DIAGNOSIS — S129XXA Fracture of neck, unspecified, initial encounter: Secondary | ICD-10-CM

## 2021-06-10 DIAGNOSIS — K611 Rectal abscess: Secondary | ICD-10-CM

## 2021-06-10 DIAGNOSIS — L409 Psoriasis, unspecified: Secondary | ICD-10-CM

## 2021-06-10 DIAGNOSIS — E785 Hyperlipidemia, unspecified: Secondary | ICD-10-CM

## 2021-06-10 DIAGNOSIS — T1491XA Suicide attempt, initial encounter: Secondary | ICD-10-CM

## 2021-06-10 DIAGNOSIS — A419 Sepsis, unspecified organism: Secondary | ICD-10-CM

## 2021-06-10 DIAGNOSIS — K219 Gastro-esophageal reflux disease without esophagitis: Secondary | ICD-10-CM

## 2021-06-10 DIAGNOSIS — Z7901 Long term (current) use of anticoagulants: Secondary | ICD-10-CM

## 2021-06-10 DIAGNOSIS — B20 Human immunodeficiency virus [HIV] disease: Secondary | ICD-10-CM

## 2021-06-10 DIAGNOSIS — I82612 Acute embolism and thrombosis of superficial veins of left upper extremity: Secondary | ICD-10-CM

## 2021-06-10 DIAGNOSIS — Z22322 Carrier or suspected carrier of Methicillin resistant Staphylococcus aureus: Secondary | ICD-10-CM

## 2021-06-10 NOTE — Patient Instructions
It was a pleasure to see you today, Charles Walls.     I have placed a referral to hematology. You can call the number listed under the referral to schedule an appointment.    Please see general surgery as scheduled.

## 2021-06-11 ENCOUNTER — Encounter: Admit: 2021-06-11 | Discharge: 2021-06-11 | Payer: No Typology Code available for payment source

## 2021-06-16 ENCOUNTER — Ambulatory Visit: Admit: 2021-06-16 | Discharge: 2021-06-17 | Payer: No Typology Code available for payment source

## 2021-06-16 ENCOUNTER — Encounter: Admit: 2021-06-16 | Discharge: 2021-06-16 | Payer: No Typology Code available for payment source

## 2021-06-16 DIAGNOSIS — B20 Human immunodeficiency virus [HIV] disease: Secondary | ICD-10-CM

## 2021-06-16 DIAGNOSIS — L409 Psoriasis, unspecified: Secondary | ICD-10-CM

## 2021-06-16 DIAGNOSIS — A419 Sepsis, unspecified organism: Secondary | ICD-10-CM

## 2021-06-16 DIAGNOSIS — E785 Hyperlipidemia, unspecified: Secondary | ICD-10-CM

## 2021-06-16 DIAGNOSIS — K5792 Diverticulitis of intestine, part unspecified, without perforation or abscess without bleeding: Secondary | ICD-10-CM

## 2021-06-16 DIAGNOSIS — K219 Gastro-esophageal reflux disease without esophagitis: Secondary | ICD-10-CM

## 2021-06-16 DIAGNOSIS — S129XXA Fracture of neck, unspecified, initial encounter: Secondary | ICD-10-CM

## 2021-06-16 DIAGNOSIS — T1491XA Suicide attempt, initial encounter: Secondary | ICD-10-CM

## 2021-06-16 DIAGNOSIS — Z4889 Encounter for other specified surgical aftercare: Secondary | ICD-10-CM

## 2021-06-16 NOTE — Progress Notes
Date of Service: 06/16/2021    Subjective:             Charles Walls is a 51 y.o. male.    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 He has been doing well. He is doing dressing changes as needed with no drainage. He doe sitz baths daily. Denies fevers & chills      Review of Systems  All negative besides what's stated above     Objective:         ? acetaminophen (TYLENOL EXTRA STRENGTH) 500 mg tablet Take one tablet to two tablets by mouth every 6 hours as needed for Pain. Max of 4,000 mg of acetaminophen in 24 hours.   ? atorvastatin (LIPITOR) 10 mg tablet Take one tablet by mouth daily.   ? cyanocobalamin (vitamin B-12) 100 mcg tablet Take one tablet by mouth daily.   ? ELIQUIS 5 mg tablet Take 1 Tablet by mouth 2 (two) times daily after completion of starter pack.   ? sildenafiL (VIAGRA) 50 mg tablet Take one tablet by mouth as Needed for Erectile dysfunction.   ? TRIUMEQ 600-50-300 mg tablet Take 1 Tablet by mouth once daily   ? valACYclovir (VALTREX) 1 gram tablet Take 2 Tablets by mouth every 12 (twelve) hours for 4 days for outbreak     Vitals:    06/16/21 0934   BP: 129/79   BP Source: Arm, Right Upper   Pulse: 98   Temp: 36.8 ?C (98.2 ?F)   Resp: 16   SpO2: 99%   TempSrc: Oral   PainSc: Zero   Weight: 64 kg (141 lb)   Height: 165.1 cm (5' 5)     Body mass index is 23.46 kg/m?Marland Kitchen     Physical Exam  Constitutional:       Appearance: Normal appearance.   HENT:      Head: Normocephalic and atraumatic.   Cardiovascular:      Rate and Rhythm: Normal rate.   Pulmonary:      Effort: Pulmonary effort is normal.   Abdominal:      General: There is no distension.      Palpations: Abdomen is soft.      Tenderness: There is no abdominal tenderness.   Genitourinary:     Comments: Left perianal wound healing well with no surrounding erythema or purulence and 100% granulation tissue.   Musculoskeletal:         General: Normal range of motion.      Cervical back: Normal range of motion.   Skin:     General: Skin is warm and dry.   Neurological:      Mental Status: He is alert and oriented to person, place, and time.   Psychiatric:         Mood and Affect: Mood normal.              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    - No evidence of recurrence, almost completely healed. Continue sitz baths until fully healed  - RTC PRN    Sherre Poot, DO                ATTESTATION    I personally observed the resident performing  the E/M, discussed case with resident, and concur with resident documentation of history, physical assessment and treatment plan unless otherwise noted.    Staff name:  Ronn Melena, MD Date:  06/16/2021

## 2021-06-16 NOTE — Patient Instructions
Please contact our clinic if you have any questions or concerns. Call us at [phone] or send an email through the MyChart system if you have non-urgent concerns. We will get back to you as soon as possible. For urgent or after hours needs, please call [phone] and ask for the [facility] on call provider.     NOTE: MyChart messages and phone calls received on weekends, on holidays, and after 4 pm on weekdays will NOT be seen until the following business day. If you have an urgent matter during these times, please call [phone] to reach the on-call team.     If you need prescription refills, please contact your pharmacy.     Our fax number is [phone]. Please be sure your name and date of birth are on any forms that are sent to us for completion. We will have them filled out and signed as soon as possible, but our providers are not in clinic every day, so it can take up to a week.     Lab and test results:  As a part of the CARES act, starting 04/19/2019, some results will be released to you via MyChart immediately and automatically.  You may see results before your provider sees them; however, your provider will review all these results and then they, or one of their team, will notify you of result information and recommendations.   Critical results will be addressed immediately, but otherwise, please allow us time to get back with you prior to you reaching out to us for questions.  This will usually take about 72 hours for labs and 5-7 days for procedure test results.  '

## 2021-06-17 DIAGNOSIS — K611 Rectal abscess: Secondary | ICD-10-CM

## 2021-06-22 ENCOUNTER — Encounter: Admit: 2021-06-22 | Discharge: 2021-06-22 | Payer: No Typology Code available for payment source

## 2021-06-22 DIAGNOSIS — S129XXA Fracture of neck, unspecified, initial encounter: Secondary | ICD-10-CM

## 2021-06-22 DIAGNOSIS — K219 Gastro-esophageal reflux disease without esophagitis: Secondary | ICD-10-CM

## 2021-06-22 DIAGNOSIS — L02414 Cutaneous abscess of left upper limb: Secondary | ICD-10-CM

## 2021-06-22 DIAGNOSIS — A419 Sepsis, unspecified organism: Secondary | ICD-10-CM

## 2021-06-22 DIAGNOSIS — L03116 Cellulitis of left lower limb: Secondary | ICD-10-CM

## 2021-06-22 DIAGNOSIS — B20 Human immunodeficiency virus [HIV] disease: Secondary | ICD-10-CM

## 2021-06-22 DIAGNOSIS — T1491XA Suicide attempt, initial encounter: Secondary | ICD-10-CM

## 2021-06-22 DIAGNOSIS — E785 Hyperlipidemia, unspecified: Secondary | ICD-10-CM

## 2021-06-22 DIAGNOSIS — L409 Psoriasis, unspecified: Secondary | ICD-10-CM

## 2021-06-22 DIAGNOSIS — L03317 Cellulitis of buttock: Secondary | ICD-10-CM

## 2021-06-22 DIAGNOSIS — K5792 Diverticulitis of intestine, part unspecified, without perforation or abscess without bleeding: Secondary | ICD-10-CM

## 2021-06-22 DIAGNOSIS — L0390 Cellulitis, unspecified: Secondary | ICD-10-CM

## 2021-06-22 NOTE — Progress Notes
Date of Service: 06/24/2021    Charles Walls is a 51 y.o. male here for an annual physical.  Chief Complaint   Patient presents with   ? Physical   ? Labs Only     Not fasting    ? Other     PHQ9 score 0   ? Weight Loss     Patient states that he had lost 10 pounds when he was in the hospital and has not been able gain back - drinking 2 boosts a day with meals        Subjective:                 History of Present Illness  DDS visits: Biannually.  Eye exams: Annually.  Exercise: Does a lot of yard work  Diet: Regular -- doing 2 boosts/day to help gain 10 pounds back. Started boost about 1 week ago.      Chronic conditions:     Renal carcinoma: Diagnosed in 2019. He underwent a partial nephrectomy. No radiation or chemotherapy indicated. Sees [facility], Dr Marshell Garfinkel every 6 months.    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 Released from oncology but recommended continued annual SPEP, immunofixation, and free light chain ratio annually.     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 He is asymptomatic.     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 Seeing [facility]. Last OV was 09/2020. Not currently on meds. Has been sober since January 2023.    Thrombosis: Left cephalic vein. Noted during hospitalization in January. Occlusive. Started on Eliquis. Has F/U appt with hematology on 6/14.     Hyperlipidemia Management: Takes atorvastatin 10 mg daily.  LDL goal < 100.   Diet adherence:most of the time   Medication adherence:all of the time   Side effects to medications? No  Lab Results   Component Value Date/Time    CHOL 130 02/06/2021 02:51 AM    TRIG 77 02/06/2021 02:51 AM    HDL 33 (L) 02/06/2021 02:51 AM    LDL 84 02/06/2021 02:51 AM    VLDL 15 02/06/2021 02:51 AM    NONHDLCHOL 97 02/06/2021 02:51 AM    ALT 15 06/05/2021 04:31 AM    VITD25 26.4 (L) 03/21/2019 03:20 PM Imp: Hyperlipidemia excellent     Plan:  Discussed labs and reviewed goals for LDL, HDL, triglycerides.   Discussed exercise management and diet with emphasis on vegetables, fruit and lean meat.  Are barriers to achieving goals present? No  Medication education provided. Patient voiced understanding? Yes  Patient able to self-manage and ready to comply?Yes  Educational resources identified? Verbal Counseling       Medical History:   Diagnosis Date   ? Abscess of skin of left wrist 02/05/2021   ? Cellulitis 05/31/2021   ? Cellulitis of buttock, left 05/31/2021   ? Cellulitis of left lower extremity 07/02/2020   ? Diverticulitis 02/02/2017   ? Fracture cervical vertebra-closed (HCC)     c6, c7 - no surgical repair   ? Gastroesophageal reflux disease without esophagitis 10/05/2019    Ordered omeprazole 40mg  qday before breakfast.  Discussed lifestyle modifications. No current red flag symptoms.  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 Human immunodeficiency virus (HIV) disease (HCC)    ? Hyperlipidemia 06/13/2013   ? Psoriasis, unspecified 02/21/2014   ?  Sepsis (HCC) 03/20/2019   ? Suicide attempt [facility])      Surgical History:   Procedure Laterality Date   ? COLONOSCOPY N/A 06/17/2015    Performed by Jolee Ewing, MD at [facility]   ? ROBOT ASSISTED LAPAROSCOPIC NEPHRECTOMY PARTIAL WITH INTRAOPERATIVE ULTRASOUND  Left 06/09/2017    Performed by Daryl Eastern, MD at [facility]   ? BIOPSY/ EXCISION LYMPH NODE DEEP CERVICAL Left 08/11/2020    Performed by Alpha Gula, MD at [facility] OR   ? INCISION AND DRAINAGE ISCHIORECTAL/ PERIRECTAL ABSCESS N/A 06/02/2021    Performed by Guinevere Scarlet, MD at [facility] OR   ? HERNIA REPAIR     ? HX APPENDECTOMY     ? PR LAPAROSCOPY SURG RPR INITIAL INGUINAL HERNIA      right     Family History   Problem Relation Age of Onset   ? Melanoma Father    ? Cancer Father         oral mandibular   ? High Cholesterol Mother    ? Cancer-Colon Maternal Aunt 70   ? Diabetes Neg Hx    ? Hypertension Neg Hx    ? Cancer-Breast Neg Hx    ? Cancer-Ovarian Neg Hx    ? Cancer-Prostate Neg Hx      Social History     Socioeconomic History   ? Marital status: Divorced   ? Number of children: 0   Occupational History   ? Occupation: unemployed   Tobacco Use   ? Smoking status: Every Day     Packs/day: 0.50     Years: 36.00     Pack years: 18.00     Types: Cigarettes     Start date: 50     Passive exposure: Current   ? Smokeless tobacco: Never   Vaping Use   ? Vaping Use: Never used   Substance and Sexual Activity   ? 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 Drug use: Yes     Types: Methamphetamines, IV     Comment: Last used meth in January 2023   ? Sexual activity: Yes     Partners: Male   Social History Narrative    [employer]       Allergies   Allergen Reactions   ? Sulfa (Sulfonamide Antibiotics) SEE COMMENTS     Patient reports that his father and brother are allergic, he is unsure so he reports it as an allergy.             Review of Systems   Constitutional: Negative for chills, fatigue, fever and unexpected weight change.   HENT: Negative for congestion, dental problem, ear discharge, ear pain, hearing loss, nosebleeds, rhinorrhea, sore throat and trouble swallowing.    Eyes: Negative for pain, discharge, redness and visual disturbance.   Respiratory: Negative for cough, chest tightness, shortness of breath and wheezing.    Cardiovascular: Negative for chest pain, palpitations and leg swelling.   Gastrointestinal: Negative for abdominal pain, blood in stool, constipation, diarrhea, nausea and vomiting.   Endocrine: Negative for polydipsia, polyphagia and polyuria.   Genitourinary: Negative for difficulty urinating, dysuria, frequency, hematuria, scrotal swelling, testicular pain and urgency.   Musculoskeletal: Negative for arthralgias, back pain, myalgias, neck pain and neck stiffness.   Skin: Negative for rash.   Allergic/Immunologic: Negative for immunocompromised state. Neurological: Negative for dizziness, tremors, syncope, speech  difficulty, weakness, light-headedness, numbness and headaches.   Hematological: Negative for adenopathy. Does not bruise/bleed easily.   Psychiatric/Behavioral: Negative for dysphoric mood, self-injury, sleep disturbance and suicidal ideas. The patient is not nervous/anxious.    All other systems reviewed and are negative.        Objective:         ? acetaminophen (TYLENOL EXTRA STRENGTH) 500 mg tablet Take one tablet to two tablets by mouth every 6 hours as needed for Pain. Max of 4,000 mg of acetaminophen in 24 hours.   ? atorvastatin (LIPITOR) 10 mg tablet Take one tablet by mouth daily.   ? cyanocobalamin (vitamin B-12) 100 mcg tablet Take one tablet by mouth daily.   ? ELIQUIS 5 mg tablet Take 1 Tablet by mouth 2 (two) times daily   ? sildenafiL (VIAGRA) 50 mg tablet TAKE 1 TABLET BY MOUTH AS NEEDED FOR ERECTILE DYSFUNCTION   ? TRIUMEQ 600-50-300 mg tablet Take 1 Tablet by mouth once daily   ? valACYclovir (VALTREX) 1 gram tablet Take 2 Tablets by mouth every 12 (twelve) hours for 4 days for outbreak     There are no discontinued medications.    Vitals:    06/24/21 1335   BP: 112/70   BP Source: Arm, Right Upper   Pulse: 97   Temp: 36.7 ?C (98.1 ?F)   SpO2: 96%   TempSrc: Oral   PainSc: Zero   Weight: 63.5 kg (140 lb)   Height: 163 cm (5' 4.17)     Body mass index is 23.9 kg/m?Marland Kitchen     Physical Exam  Vitals and nursing note reviewed.   Constitutional:       General: He is awake.      Appearance: Normal appearance. He is well-groomed.   HENT:      Head: Normocephalic and atraumatic.      Right Ear: Tympanic membrane, ear canal and external ear normal.      Left Ear: Tympanic membrane, ear canal and external ear normal.      Nose: Nose normal. No congestion or rhinorrhea.      Mouth/Throat:      Mouth: Mucous membranes are moist.      Pharynx: Oropharynx is clear. No oropharyngeal exudate or posterior oropharyngeal erythema.   Eyes:      General: Lids are normal. Lids are everted, no foreign bodies appreciated. No visual field deficit.        Right eye: No discharge.         Left eye: No discharge.      Extraocular Movements: Extraocular movements intact.      Conjunctiva/sclera: Conjunctivae normal.      Right eye: Right conjunctiva is not injected. No exudate or hemorrhage.     Left eye: Left conjunctiva is not injected. No exudate or hemorrhage.     Pupils: Pupils are equal, round, and reactive to light.   Neck:      Thyroid: No thyroid mass, thyromegaly or thyroid tenderness.      Vascular: No carotid bruit or JVD.   Cardiovascular:      Rate and Rhythm: Normal rate and regular rhythm.      Heart sounds: Normal heart sounds, S1 normal and S2 normal. Heart sounds not distant. No murmur heard.  Pulmonary:      Effort: Pulmonary effort is normal. No respiratory distress.      Breath sounds: Normal breath sounds. No stridor. No wheezing, rhonchi or rales.   Chest:  Chest wall: No deformity.   Abdominal:      General: Abdomen is flat. Bowel sounds are normal. There is no distension.      Palpations: Abdomen is soft. There is no mass.      Tenderness: There is no abdominal tenderness. There is no guarding.      Hernia: No hernia is present.   Musculoskeletal:         General: No deformity or signs of injury. Normal range of motion.      Cervical back: Normal range of motion and neck supple. No muscular tenderness.      Right lower leg: No edema.      Left lower leg: No edema.   Lymphadenopathy:      Cervical: No cervical adenopathy.      Upper Body:      Right upper body: No supraclavicular adenopathy.      Left upper body: No supraclavicular adenopathy.   Skin:     General: Skin is warm and dry.      Capillary Refill: Capillary refill takes less than 2 seconds.      Findings: No erythema, lesion or rash.   Neurological:      General: No focal deficit present.      Mental Status: He is alert and oriented to person, place, and time.      Cranial Nerves: No cranial nerve deficit.      Sensory: No sensory deficit.      Motor: No weakness.      Coordination: Coordination normal.      Gait: Gait normal.      Deep Tendon Reflexes: Reflexes normal.   Psychiatric:         Mood and Affect: Mood normal.         Behavior: Behavior normal. Behavior is cooperative.         Thought Content: Thought content normal.         Judgment: Judgment normal.         Depression:  Patient Scores:  PHQ-2: PHQ-2 Score: 0 (06/24/2021  1:36 PM)    PHQ-9: PHQ-9 Score: 0 (06/24/2021  1:36 PM)    Interventions:  PHQ-2: No data recorded  Depression Interventions PHQ-2/9: No data recorded    BMI:  Body mass index is 23.9 kg/m?Marland Kitchen  Plan in Progress: BMI Plan is in Progress (11/18/2020 12:24 PM)  No Follow-Up: No follow-up action or recommendations are necessary at this time (06/10/2021  4:56 PM)      Falls:  Fall History (last 89mo): No Falls (06/24/2021  1:38 PM)  Fall Risk: None identified (06/24/2021  1:38 PM)      No results found for this or any previous visit (from the past 336 hour(s)).    Health Maintenance Summary    -      HEPATITIS C SCREENING  Completed    02/06/2021  Anti HCV component of HEPATITIS C ANTIBODY W REFLEX HCV PCR   QUANT    08/27/2020  Anti HCV component of HEPATITIS C ANTIBODY W REFLEX HCV PCR   QUANT    03/21/2019  Anti HCV component of HEPATITIS C ANTIBODY W REFLEX HCV PCR   QUANT    07/29/2016  Anti HCV component of HEPATITIS C AB    07/31/2013  Anti HCV component of HEPATITIS C AB          Discontinued - COLORECTAL CANCER SCREENING  Discontinued      Frequency changed to Never automatically (Topic No  Longer Applies)    06/17/2015  COLONOSCOPY    06/17/2015  Surgical Procedure: COLONOSCOPY                       Assessment and Plan:              [name] was seen today for physical, labs only, other and weight loss.    Diagnoses and all orders for this visit:    Routine general medical examination at a health care facility  -     LIPID PROFILE; Future; Expected date: 06/24/2021  -     URINALYSIS DIPSTICK REFLEX TO CULTURE; Future; Expected date: 06/24/2021  -     URINALYSIS MICROSCOPIC REFLEX TO CULTURE; Future; Expected date: 06/24/2021  -     UA GREY TOP TUBE; Future; Expected date: 06/24/2021  -     CLEAR TOP EXTRA URINE TUBE; Future; Expected date: 06/24/2021  RTC for fasting labs.  Reviewed the importance of healthy diet and regular exercise routine.   Encouraged the continued participation in preventative medicine for the early detection of disease and improved health.   Provided education (see patient AVS)  regarding: Annual eye and dental exams, daily exercise, healthy diet, mole monitoring/skin checks, avoidance of tobacco products, and limiting alcohol.   Immunizations recommended today: Prevnar 20 (do at next visit)  Health Maintenance items reviewed. Health maintenance items UTD at this time.     Mixed hyperlipidemia  -     LIPID PROFILE; Future; Expected date: 06/24/2021  Calcification of aortic valve  Chronic. Takes atorvastatin 10 mg daily.   RTC for lab.     Reactive lymphoid hyperplasia  Renal cell carcinoma of left kidney (HCC)  Renal cell cancer, left (HCC)  Chronic. Continue to follow with [facility].    Perirectal abscess  Improving. Saw General Surgery last week, healing as expected.     HIV infection, unspecified symptom status (HCC)  Immunodeficiency, unspecified (HCC)  Chronic. Continue to follow with [facility].    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    Pulmonary emphysema, unspecified emphysema type (HCC)  Noted on CT Chest in September 2022. Asymptomatic.     Tobacco abuse  Cutting down on cigarettes. Down to 0.5 PPD from 1 PPD. Encouraged continued cessation.     Screening for malignant neoplasm of prostate  -     PSA SCREEN; Future; Expected date: 06/24/2021  Check PSA with lab.      I reviewed with the patient their current medications and specifically any new medications prescribed at the time of this visit and we reviewed the expected benefits and potential side effects. All questions are answered to the patient's satisfaction.    Patient education provided regarding diagnosis, course, and treatment. Patient was in agreement to instructions, POC, and will call if questions or concerns arise.     Total Time Today was 38 minutes in the following activities: Preparing to see the patient, Obtaining and/or reviewing separately obtained history, Performing a medically appropriate examination and/or evaluation, Counseling and educating the patient/family/caregiver, Ordering medications, tests, or procedures, Referring and communication with other health care professionals (when not separately reported), Documenting clinical information in the electronic or other health record and Independently interpreting results (not separately reported) and communicating results to the patient/family/caregiver      Return in about 6 months (around 12/24/2021) for chronic coniditions.      Future  Appointments   Date Time Provider Department Center   07/01/2021 11:20 AM Trish Mage, MD [facility] [place] [facility]   07/31/2021  1:15 PM Matlock, Netta Neat, MD [facility]   08/05/2021  3:00 PM El Drema Balzarine I, MD [facility]

## 2021-06-23 ENCOUNTER — Encounter: Admit: 2021-06-23 | Discharge: 2021-06-23 | Payer: No Typology Code available for payment source

## 2021-06-23 MED ORDER — ELIQUIS 5 MG PO TAB
5 mg | ORAL_TABLET | Freq: Two times a day (BID) | ORAL | 0 refills
Start: 2021-06-23 — End: ?

## 2021-06-24 ENCOUNTER — Ambulatory Visit: Admit: 2021-06-24 | Discharge: 2021-06-24 | Payer: No Typology Code available for payment source

## 2021-06-24 ENCOUNTER — Encounter: Admit: 2021-06-24 | Discharge: 2021-06-24 | Payer: No Typology Code available for payment source

## 2021-06-24 DIAGNOSIS — K611 Rectal abscess: Secondary | ICD-10-CM

## 2021-06-24 DIAGNOSIS — L02414 Cutaneous abscess of left upper limb: Secondary | ICD-10-CM

## 2021-06-24 DIAGNOSIS — E782 Mixed hyperlipidemia: Secondary | ICD-10-CM

## 2021-06-24 DIAGNOSIS — R599 Enlarged lymph nodes, unspecified: Secondary | ICD-10-CM

## 2021-06-24 DIAGNOSIS — B20 Human immunodeficiency virus [HIV] disease: Secondary | ICD-10-CM

## 2021-06-24 DIAGNOSIS — L409 Psoriasis, unspecified: Secondary | ICD-10-CM

## 2021-06-24 DIAGNOSIS — L03116 Cellulitis of left lower limb: Secondary | ICD-10-CM

## 2021-06-24 DIAGNOSIS — Z720 Tobacco use: Secondary | ICD-10-CM

## 2021-06-24 DIAGNOSIS — A419 Sepsis, unspecified organism: Secondary | ICD-10-CM

## 2021-06-24 DIAGNOSIS — E785 Hyperlipidemia, unspecified: Secondary | ICD-10-CM

## 2021-06-24 DIAGNOSIS — Z0000 Encounter for general adult medical examination without abnormal findings: Secondary | ICD-10-CM

## 2021-06-24 DIAGNOSIS — L03317 Cellulitis of buttock: Secondary | ICD-10-CM

## 2021-06-24 DIAGNOSIS — K5792 Diverticulitis of intestine, part unspecified, without perforation or abscess without bleeding: Secondary | ICD-10-CM

## 2021-06-24 DIAGNOSIS — I359 Nonrheumatic aortic valve disorder, unspecified: Secondary | ICD-10-CM

## 2021-06-24 DIAGNOSIS — R931 Abnormal findings on diagnostic imaging of heart and coronary circulation: Secondary | ICD-10-CM

## 2021-06-24 DIAGNOSIS — F1520 Other stimulant dependence, uncomplicated: Secondary | ICD-10-CM

## 2021-06-24 DIAGNOSIS — J439 Emphysema, unspecified: Secondary | ICD-10-CM

## 2021-06-24 DIAGNOSIS — D849 Immunodeficiency, unspecified: Secondary | ICD-10-CM

## 2021-06-24 DIAGNOSIS — Z125 Encounter for screening for malignant neoplasm of prostate: Secondary | ICD-10-CM

## 2021-06-24 DIAGNOSIS — L0390 Cellulitis, unspecified: Secondary | ICD-10-CM

## 2021-06-24 DIAGNOSIS — S129XXA Fracture of neck, unspecified, initial encounter: Secondary | ICD-10-CM

## 2021-06-24 DIAGNOSIS — T1491XA Suicide attempt, initial encounter: Secondary | ICD-10-CM

## 2021-06-24 DIAGNOSIS — F1510 Other stimulant abuse, uncomplicated: Secondary | ICD-10-CM

## 2021-06-24 DIAGNOSIS — K219 Gastro-esophageal reflux disease without esophagitis: Secondary | ICD-10-CM

## 2021-06-24 DIAGNOSIS — C642 Malignant neoplasm of left kidney, except renal pelvis: Secondary | ICD-10-CM

## 2021-06-24 MED ORDER — ATORVASTATIN 10 MG PO TAB
10 mg | ORAL_TABLET | Freq: Every day | ORAL | 0 refills | Status: AC
Start: 2021-06-24 — End: ?

## 2021-06-24 MED ORDER — SILDENAFIL 50 MG PO TAB
ORAL_TABLET | ORAL | 2 refills | 45.00000 days | Status: AC
Start: 2021-06-24 — End: ?

## 2021-06-24 NOTE — Patient Instructions
It was a pleasure to see you today and take part in your care.     Notes from today's visit:  Please see attached documents for tips on mole monitoring, preventative care screenings, and additional information about living your healthiest life!  Please plan to return for a LAB-ONLY visit to complete your ordered blood work from today's visit. PLEASE COME FASTING for 8 HOURS (nothing to eat or drink aside from water or black coffee). NO APPOINTMENT NEEDED. OUR WALK IN LAB HOURS: MON - FRI 8am-11:30am, 1pm-4:30pm.  Labs: Your labs will be available for you to view on MyChart as early as 8 hours from the time they are drawn. Please allow me 2-3 business days to review all results and post a results message to you. I typically wait until all results are back before reviewing. If anything is critical, I will be promptly paged and you will be notified. I appreciate your patience and understanding!    Patient satisfaction is essential to our success. You may be contacted by the Medical Practice Survey in the coming days for a survey and your feedback is very important to [place]. Feedback helps [place] identify our strengths and weaknesses in relationship to the goals we have set for ourselves. Responses of VERY GOOD (5) are viewed as positive. If you do not feel we reached this level of service please share your concerns with myself and/or our Engineer, manufacturing.      *If you had to wait on me today, please accept my sincerest apologies. I know your time is very valuable and I want to honor it. It always my best intention to run on time, however on occasion, I do run behind due to unexpected issues that arise with patients.     Have a delightful day,   Acy Orsak, APRN        Prevention Guidelines, Men Ages 64 to 59  Screening tests and vaccines are an important part of managing your health. A screening test is done to find diseases in people who don't have any symptoms. The goal is to find a disease early so lifestyle changes and checkups can reduce the risk of disease. Or the goal may be to detect it early to treat it most effectively. Screening tests are not used to diagnose a disease. But they are used to see if more testing is needed. Health counseling is important, too. Below are guidelines for these, for men ages 45 to 63. Keep in mind that screening recommendations vary among expert groups. Talk with your healthcare provider about which tests are best for you and to make sure you?re up-to-date on what you need.   Screening  Who needs it  How often    Unhealthy alcohol use  All men in this age group  At routine exams   Blood pressure All men in this age group  Yearly checkup if your blood pressure is normal   Normal blood pressure is 120/80 mm Hg   If your blood pressure reading is higher than normal, follow the advice of your healthcare provider    Colorectal cancer All men at average risk in this age group  Multiple tests are available and are used at different times. Possible tests include:   Colonoscopy every 10 years, or  Flexible sigmoidoscopy every 5 years (or every 10 years with yearly FIT stool test), or  CT colonography (virtual colonoscopy) every 5 years, or  Yearly fecal occult blood test, or  Yearly stool fecal immunochemical test (  FIT) , or  Stool DNA test, every 1 to 3 years  If you choose a test other than a colonoscopy and have an abnormal test result, you will need to follow-up with a colonoscopy. Talk with your healthcare provider about which tests are best for you.   Some people should be screened using a different schedule because of their personal or family health history. Talk with your healthcare provider about your health history.    Depression All men in this age group  At routine exams   Type 2 diabetes or prediabetes  All men beginning at age 68 and men without symptoms at any age who are overweight or obese and have 1 or more other risk factors for diabetes  At least every 3 years (yearly if your blood sugar has already begun to rise)    Type 2 diabetes All men with prediabetes  Every year   Hepatitis C Men at increased risk for infection; 1 time for those born between 1945 and 1965  At routine exams; talk with your healthcare provider.    High cholesterol or triglycerides  All men in this age group  At least every 4 to 6 years; talk with your healthcare provider about your risk and whether it should be tested more often    HIV All males up to age 60 and men at increased risk.  At least once up to age 53 at routine exams; talk with your healthcare provider if you are at risk    Lung cancer Men between ages 49 and 15 who are in fairly good health and are at higher risk for lung cancer:   Currently smoke or who have quit within past 15 years  20-pack year smoking history, or 1 pack/day for 20 years or 2 packs/day for 10 years  Expert groups vary a bit in their recommendations so talk with your provider  Yearly lung cancer screening with a low-dose CT scan (LDCT); talk with your healthcare provider    Obesity All men in this age group  At yearly routine exams    BMI (body mass index) All men in this age group Every year, to help find out if you are at a healthy weight for your height    Prostate cancer Starting at age 57, talk with your healthcare provider about risks and benefits of testing with digital rectal exam (DRE) and prostate-specific antigen (PSA) screening  At routine exams if you decide to be tested.    Syphilis Men at increased risk for infection  At routine exams; talk with your healthcare provider    Tuberculosis Men at increased risk for infection  Talk with your healthcare provider    Vision All men in this age group  Talk with your healthcare provider   Vaccine  Who needs it  How often    Chickenpox (varicella)  All men in this age group who have no record of this infection or vaccine  2 doses; second dose should be given at least 4 weeks after the first dose    Hepatitis A Men at increased risk for infection  2 or 3 doses (depending on the vaccine) given at least 6 months apart; talk with your healthcare provider    Hepatitis B Men at increased risk for infection  2 or 3 doses (depending on the vaccine) second dose should be given 1 month after the first dose; if a third dose , it should be given at least 2 months after the  second dose and at least 4 months after the first dose; talk with your healthcare provider    Haemophilus influenzae Type B (HIB)  Men at increased risk for infection  1 or 3 doses; talk with your healthcare provider    Influenza (flu) All men in this age group  Once a year   COVID-19 All men in this age group  1 to 2 doses depending on vaccine; talk with your healthcare provider    Measles, mumps, rubella (MMR)  Men in this age group born in [birth year] or later who have no record of these infections or vaccines  1 or 2 doses; talk with your healthcare provider    Meningococcal ACWY (MenACWY)  Men at increased risk for infection  1 or 2 doses depending on your case. Then a booster every 5 years if you are still at risk. Talk with your healthcare provider.    Meningococcal B (MenB) Men at increased risk for infection 2 or 3 doses, depending on the vaccine and your case; talk with your healthcare provider    Pneumococcal conjugate vaccine (PCV13) and pneumococcal polysaccharide vaccine (PPSV23)  Men at increased risk for infection  PCV13: 1 dose ages 4 to 60 (protects against 13 types of pneumococcal bacteria)   PPSV23: 1 to 2 doses through age 29, (protects against 23 types of pneumococcal bacteria)    Tetanus/diphtheria/pertussis (Td/Tdap) booster All men in this age group  1 dose if Tdap, then Td or Tdap booster every 10 years    Recombinant zoster vaccine (RZV) (Shingrix)  All men in this age group  2 doses; the 2nd dose is given 2 to 6 months after the first. This is given even if you've had shingles before, not sure if you have had chickenpox, or had a previous zoster live vaccine Counseling  Who needs it  How often    Diet and exercise Men who are overweight or obese  When diagnosed, and then at routine exams    Sexually transmitted infection prevention  Men at increased risk for infection  At routine exams; talk with your healthcare provider    Use of daily aspirin  Men who have a history of heart attack or stroke may be prescribed daily aspirin. Experts vary on their recommendations on whether it should be given routinely  At routine exams; talk with your healthcare provider about the risks vs. benefits for you    Use of statin medicines for cholesterol Men between the ages of 76 and 42 years who have   An LDL-C level of more than 70 mg/dL but less than 161 mg/dL, no diabetes and borderline to high level of risk  An LDL-C level of 190 mg/dL or greater  A diagnosis of diabetes and LDL-C level of greater than 70mg /dL At routine exams, or more often as directed by your healthcare provider. Statin dosages may vary based on your overall health, risk factors, and other health conditions such as diabetes. Talk with your healthcare provider about your risk .    Use of tobacco and the health effects it can cause  All men in this age group  Every exam   [employer] last reviewed this educational content on 05/19/2019  ? 2000-2021 [employer], [place]. All rights reserved. This information is not intended as a substitute for professional medical care. Always follow your healthcare professional's instructions.

## 2021-07-01 ENCOUNTER — Encounter: Admit: 2021-07-01 | Discharge: 2021-07-01 | Payer: No Typology Code available for payment source

## 2021-07-01 DIAGNOSIS — T1491XA Suicide attempt, initial encounter: Secondary | ICD-10-CM

## 2021-07-01 DIAGNOSIS — R591 Generalized enlarged lymph nodes: Secondary | ICD-10-CM

## 2021-07-01 DIAGNOSIS — A419 Sepsis, unspecified organism: Secondary | ICD-10-CM

## 2021-07-01 DIAGNOSIS — L03116 Cellulitis of left lower limb: Secondary | ICD-10-CM

## 2021-07-01 DIAGNOSIS — B20 Human immunodeficiency virus [HIV] disease: Secondary | ICD-10-CM

## 2021-07-01 DIAGNOSIS — K219 Gastro-esophageal reflux disease without esophagitis: Secondary | ICD-10-CM

## 2021-07-01 DIAGNOSIS — L409 Psoriasis, unspecified: Secondary | ICD-10-CM

## 2021-07-01 DIAGNOSIS — L03317 Cellulitis of buttock: Secondary | ICD-10-CM

## 2021-07-01 DIAGNOSIS — L0390 Cellulitis, unspecified: Secondary | ICD-10-CM

## 2021-07-01 DIAGNOSIS — S129XXA Fracture of neck, unspecified, initial encounter: Secondary | ICD-10-CM

## 2021-07-01 DIAGNOSIS — K5792 Diverticulitis of intestine, part unspecified, without perforation or abscess without bleeding: Secondary | ICD-10-CM

## 2021-07-01 DIAGNOSIS — E785 Hyperlipidemia, unspecified: Secondary | ICD-10-CM

## 2021-07-01 DIAGNOSIS — L02414 Cutaneous abscess of left upper limb: Secondary | ICD-10-CM

## 2021-07-01 NOTE — Patient Instructions
Thank you for allowing [facility] to be a part of your care team. We aim to provide you with the best cancer care. Please read below for important guidelines and contact information.    Your care team:      Dr. Lelon Frohlich, MD               Hematologist   Kavin Leech, APRN-NP  Donnie Aho, APRN-NP    Hematology Nurse Practitioners  Nolon Nations, RN, BSN  Eulis Canner, RN, BSN              Clinical Nurse Coordinators [facility])                    Mychart:   - We strongly recommend signing up for Mychart, our patient access portal, and sending [facility] non-urgent questions/concerns through this portal. We will reply within one business day.   - This is also a great resource to view all of your labs and scan results.   - Please feel free to ask our scheduler upon checkout for help setting up your Mychart access.     Notify Clinic for the Following:  Fevers at or greater than 100.5 degrees (do not take Tylenol until you have spoken to a provider)  Persistent or uncontrolled vomiting  Persistent or uncontrolled diarrhea (more than 6 watery stools per day) or signs of blood in stools   Progressive cough with increased sputum (phlegm or mucus) production accompanied by increased shortness of breath  Recurrent nosebleeds or nosebleeds that lasts longer than 30 minutes.  Severe mouth sores/mouth pain  Severe constipation without a bowel movement in 4-5 days or significant straining.    COVID-19 Vaccine Updates:  We recommend remaining up to date on COVID vaccinations, including available boosters.   Please reach out to [facility]s with any questions you may have regarding COVID vaccinations.   You can find the latest updates by going to our website: [email or web address].     Phone numbers:      Direct Clinic Number: [phone]  Fax Number: [phone]  Scheduling Desk: [phone]    Please call Dr. Noralee Stain and Delton Coombes nurse line (listed above) if you have non-urgent questions or concerns during business hours.   Messages left on nurses line are checked Monday through Friday between the hours of 8:00 AM and 4:00 PM. Messages left after 4:00 pm will be returned the following business day.     Evening (after 4:00 PM), weekends, and holiday on-call: [phone]   For urgent needs after hours, please ask for the oncologist on-call to be paged.   For urgent needs during business hours, please ask for Lynwood Dawley or Jackelyn Poling, Dr. Beau Fanny and Delton Coombes nurse coordinators, to be paged.     **For patients who would like to establish care with a primary care provider within the [facility] network, please call  Internal Medicine Scheduling: [phone]    Notes:   - Allow 7-10 business days for our office to complete any requested paperwork (FMLA, etc.)   - Allow two to three business days for all medication refills. Please call your pharmacy first to check for available refills.

## 2021-07-01 NOTE — Progress Notes
Name: Charles Walls          MRN: [id number]      DOB: Feb 16, 1970      AGE: 51 y.o.   DATE OF SERVICE: 07/01/2021    Referring Provider: Dr.  Roma Kayser    Subjective:             Reason for Visit/HPI:    Patient is a 51 year old male who first noticed a lump underneath his chin in March 2022, And the subsequent sonogram showed a 1.2 cm lesion in the submental region as well as a highly suspicious 9 mm left thyroid lobe nodule. The latter was noted to be a benign follicular nodule as per FNA. He then had an FNA of the left submental lymph node that showed an atypical lymphoid proliferation, suspicious for lymphoma.     He then established care with [facility] who ordered a Neck CT on 08/06/20 which showed mildly prominent bilateral level 1, 2, and 3 lymph nodes, indeterminate between reactive etiology versus lymphomatous involvement. An excisional biopsy was done at [place] on 08/11/20 which showed a reactive lymph node, but the flow cytometry showed atypical CD10+ B cells.     He was referred to the [facility] lymphoma clinic, and workups done here were not significant except mildly elevated kappa and lambda free light chain ratio at 1.79. The peripheral blood flow cytometry was negative. Clinically, he does not have constitutional symptoms.     [facility] on October [facility], 2020 for a follow up visit. He is overall doing well. The results of the laboratory testings are stable and satisfactory without any associated cytopenia or abnormal chemistry profile.  His kappa lambda free light chain ratio however is noted to be slightly elevated at 1.7, with the upper limits of normal at 1.65.    CT scans of the chest, abdomen, and pelvis did not show any lymphadenopathy or organomegaly.  Note that he has stable lung nodules.      07/01/2021 Interval History:    Charles Walls presents for follow-up.     He was previously followed for non-specific lymphadenopathy. Work-up was unrevealing.     He was admitted to [facility] from 02/05/22-02/10/21 due to left wrist pain and swelling.   Ultrasound obtained of left wrist showing 0.5 cm fluid collection in the subcutaneous tissue of dorsal left wrist. He reports he had a cyst there that ruptured.  He was treated with incision and drainage as well as antibiotics. Ultrasound of left wrist also showed nonocclusive superficial thrombus of the left cephalic vein from high upper arm to left wrist.  Patient was started on therapeutic dose Lovenox and transitioned to continued therapeutic Eliquis in outpatient setting.      Past Medical History:       Abscess of skin of left wrist  Cellulitis  Cellulitis of buttock, left  Cellulitis of left lower extremity  Diverticulitis  Fracture cervical vertebra-closed (HCC)      Comment:  c6, c7 - no surgical repair  Gastroesophageal reflux disease without esophagitis      Comment:  Ordered omeprazole 40mg  qday before breakfast.                 Discussed lifestyle modifications. No current red flag                symptoms.  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 COLONOSCOPY N/A 06/17/2015    Performed by Jolee Ewing, [facility] at [facility]   ? ROBOT ASSISTED LAPAROSCOPIC NEPHRECTOMY PARTIAL WITH INTRAOPERATIVE ULTRASOUND  Left 06/09/2017    Performed by Daryl Eastern, MD at [facility] OR   ? BIOPSY/ EXCISION LYMPH NODE DEEP CERVICAL Left 08/11/2020    Performed by Alpha Gula, MD at [facility] OR   ? INCISION AND DRAINAGE ISCHIORECTAL/ PERIRECTAL ABSCESS N/A 06/02/2021    Performed by Guinevere Scarlet, MD at [facility] OR   ? HERNIA REPAIR     ? HX APPENDECTOMY     ? PR LAPAROSCOPY SURG RPR INITIAL INGUINAL HERNIA      right     Allergies:  Allergies   Allergen Reactions   ? Sulfa (Sulfonamide Antibiotics) SEE COMMENTS     Patient reports that his father and brother are allergic, he is unsure so he reports it as an allergy. Medications:    Current Outpatient Medications:   ?  acetaminophen (TYLENOL EXTRA STRENGTH) 500 mg tablet, Take one tablet to two tablets by mouth every 6 hours as needed for Pain. Max of 4,000 mg of acetaminophen in 24 hours., Disp: , Rfl:   ?  atorvastatin (LIPITOR) 10 mg tablet, Take one tablet by mouth daily., Disp: 90 tablet, Rfl: 0  ?  cyanocobalamin (vitamin B-12) 100 mcg tablet, Take one tablet by mouth daily., Disp: , Rfl:   ?  ELIQUIS 5 mg tablet, Take 1 Tablet by mouth 2 (two) times daily, Disp: 60 tablet, Rfl: 0  ?  sildenafiL (VIAGRA) 50 mg tablet, TAKE 1 TABLET BY MOUTH AS NEEDED FOR ERECTILE DYSFUNCTION, Disp: 20 tablet, Rfl: 2  ?  TRIUMEQ 600-50-300 mg tablet, Take 1 Tablet by mouth once daily, Disp: 90 tablet, Rfl: 1  ?  valACYclovir (VALTREX) 1 gram tablet, Take 2 Tablets by mouth every 12 (twelve) hours for 4 days for outbreak, Disp: 16 tablet, Rfl: 0    Family History:  Family History   Problem Relation Age of Onset   ? Melanoma Father    ? Cancer Father         oral mandibular   ? High Cholesterol Mother    ? Cancer-Colon Maternal Aunt 70   ? Diabetes Neg Hx    ? Hypertension Neg Hx    ? Cancer-Breast Neg Hx    ? Cancer-Ovarian Neg Hx    ? Cancer-Prostate Neg Hx      Patient Social History:   Social History     Socioeconomic History   ? Marital status: Divorced   ? Number of children: 0   Occupational History   ? Occupation: unemployed   Tobacco Use   ? Smoking status: Every Day     Packs/day: 0.50     Years: 36.00     Pack years: 18.00     Types: Cigarettes     Start date: 27     Passive exposure: Current   ? Smokeless tobacco: Never   Vaping Use   ? Vaping Use: Never used   Substance and Sexual Activity   ? 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 Drug use: Yes     Types: Methamphetamines, IV     Comment: Last used meth in January 2023   ? Sexual activity:  Yes     Partners: Male   Social History Narrative    Pagan Review of Systems   Constitutional: Negative for activity change, appetite change, chills, diaphoresis, fatigue, fever and unexpected weight change.   HENT: Negative for sneezing and sore throat.    Respiratory: Negative for chest tightness.    Cardiovascular: Negative for chest pain.   Gastrointestinal: Negative for abdominal distention.   Genitourinary: Negative for enuresis, flank pain and frequency.   Skin: Negative for pallor and rash.   Neurological: Negative for seizures, light-headedness and numbness.   Hematological: Negative for adenopathy. Does not bruise/bleed easily.   Psychiatric/Behavioral: Negative for confusion, sleep disturbance and suicidal ideas.       Objective:         ? acetaminophen (TYLENOL EXTRA STRENGTH) 500 mg tablet Take one tablet to two tablets by mouth every 6 hours as needed for Pain. Max of 4,000 mg of acetaminophen in 24 hours.   ? atorvastatin (LIPITOR) 10 mg tablet Take one tablet by mouth daily.   ? cyanocobalamin (vitamin B-12) 100 mcg tablet Take one tablet by mouth daily.   ? ELIQUIS 5 mg tablet Take 1 Tablet by mouth 2 (two) times daily   ? sildenafiL (VIAGRA) 50 mg tablet TAKE 1 TABLET BY MOUTH AS NEEDED FOR ERECTILE DYSFUNCTION   ? TRIUMEQ 600-50-300 mg tablet Take 1 Tablet by mouth once daily   ? valACYclovir (VALTREX) 1 gram tablet Take 2 Tablets by mouth every 12 (twelve) hours for 4 days for outbreak     There were no vitals filed for this visit.  There is no height or weight on file to calculate BMI.                  02/05/21 US Doppler Venous Left:    IMPRESSION       1. ?Complex fluid collection in the subcutaneous tissues of the posterior   aspect of the left wrist measures up to 3.5 cm. There is surrounding   hyperemia, consistent with abscess.   2. ?Severe diffuse edema of the posterior aspect of the wrist.   3. ?The left arm is negative for DVT.   4. ?Nonocclusive superficial thrombus in the left cephalic vein extending   from the high upper arm to the level of the wrist.       03/31/21 US Doppler Venous Left:  IMPRESSION     1. ?No deep vein thrombus in the left upper extremity.   2. ?Persistent occlusive superficial thrombus within the left cephalic   vein, again extending from the high upper arm through the mid forearm.   3. ?Edema and nonorganized fluid surrounding the distal cephalic vein,   likely representing thrombophlebitis. No residual or recurrent   drainable-organized fluid collection or hyperemia.      Physical Exam  Constitutional:       Appearance: Normal appearance.   HENT:      Mouth/Throat:      Mouth: Mucous membranes are moist.   Eyes:      General: No scleral icterus.     Conjunctiva/sclera: Conjunctivae normal.   Cardiovascular:      Rate and Rhythm: Normal rate and regular rhythm.      Pulses: Normal pulses.      Heart sounds: Normal heart sounds.   Pulmonary:      Effort: Pulmonary effort is normal.      Breath sounds: Normal breath sounds.   Abdominal:      General:  Abdomen is flat.      Palpations: Abdomen is soft.   Musculoskeletal:         General: No swelling.      Cervical back: Neck supple.   Skin:     General: Skin is warm.   Neurological:      Mental Status: He is alert and oriented to person, place, and time.   Psychiatric:         Mood and Affect: Mood normal.            Assessment and Plan:      The imaging findings as well as laboratory results have been reviewed.    Assessment and Plan:  #Reactive Lymphoid Hyperplasia  #Mildly elevated kappa and lambda free light chain ratio  #HIV  #History of IV methamphetamine use     Mr. 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 He reports that he had a burst cyst on that wrist.     He has been taking Eliquis 5 mg BID since that time. He denies any pain, symptoms, or swelling of his wrist or hand.     He is interested in stopping the Eliquis to undergo dental work.     Regarding anticoagulation, discussed with Dr. Vincenza Hews that as this was a superficial thrombosis related to an infection. As such, further anticoagulation is not needed. He will stop Eliquis. There is no need to restart. He will take his remaining supply to his local pharmacy to dispose of.     He can return to clinic as needed. Routine follow-up with PCP and ID.     -pt seen and discussed with Dr. Joya Gaskins, DO, PGY6  Hematology Oncology Fellow

## 2021-07-02 ENCOUNTER — Encounter: Admit: 2021-07-02 | Discharge: 2021-07-02 | Payer: No Typology Code available for payment source

## 2021-07-08 ENCOUNTER — Encounter: Admit: 2021-07-08 | Discharge: 2021-07-08 | Payer: No Typology Code available for payment source

## 2021-07-09 ENCOUNTER — Ambulatory Visit: Admit: 2021-07-09 | Discharge: 2021-07-09 | Payer: No Typology Code available for payment source

## 2021-07-09 ENCOUNTER — Encounter: Admit: 2021-07-09 | Discharge: 2021-07-09 | Payer: No Typology Code available for payment source

## 2021-07-09 DIAGNOSIS — Z0000 Encounter for general adult medical examination without abnormal findings: Secondary | ICD-10-CM

## 2021-07-09 DIAGNOSIS — E782 Mixed hyperlipidemia: Secondary | ICD-10-CM

## 2021-07-09 DIAGNOSIS — Z125 Encounter for screening for malignant neoplasm of prostate: Secondary | ICD-10-CM

## 2021-07-09 DIAGNOSIS — R591 Generalized enlarged lymph nodes: Secondary | ICD-10-CM

## 2021-07-09 LAB — LIPID PROFILE
CHOLESTEROL: 120 mg/dL (ref <200)
HDL: 39 mg/dL — ABNORMAL LOW (ref >40)
LDL: 74 mg/dL (ref <100)
NON HDL CHOLESTEROL: 81 mg/dL
TRIGLYCERIDES: 139 mg/dL (ref <150)
VLDL: 28 mg/dL

## 2021-07-09 LAB — PSA SCREEN: PSA SCREEN: 0.8 ng/mL (ref <2.01)

## 2021-07-09 LAB — POC CREATININE, RAD: CREATININE, POC: 1.2 mg/dL (ref 0.4–1.24)

## 2021-07-09 MED ORDER — SODIUM CHLORIDE 0.9 % IJ SOLN
50 mL | Freq: Once | INTRAVENOUS | 0 refills | Status: CP
Start: 2021-07-09 — End: ?
  Administered 2021-07-09: 21:00:00 50 mL via INTRAVENOUS

## 2021-07-09 MED ORDER — IOHEXOL 350 MG IODINE/ML IV SOLN
150 mL | Freq: Once | INTRAVENOUS | 0 refills | Status: CP
Start: 2021-07-09 — End: ?
  Administered 2021-07-09: 21:00:00 150 mL via INTRAVENOUS

## 2021-07-10 ENCOUNTER — Encounter: Admit: 2021-07-10 | Discharge: 2021-07-10 | Payer: No Typology Code available for payment source

## 2021-07-13 ENCOUNTER — Encounter: Admit: 2021-07-13 | Discharge: 2021-07-13 | Payer: No Typology Code available for payment source

## 2021-07-13 DIAGNOSIS — I700 Atherosclerosis of aorta: Secondary | ICD-10-CM

## 2021-07-13 NOTE — Telephone Encounter
Phone call placed to pt regarding CT results and recommendation for echo. Pt agreeable and cardiology scheduling number provided to pt.

## 2021-07-22 ENCOUNTER — Encounter: Admit: 2021-07-22 | Discharge: 2021-07-22 | Payer: No Typology Code available for payment source

## 2021-07-24 ENCOUNTER — Encounter: Admit: 2021-07-24 | Discharge: 2021-07-24 | Payer: No Typology Code available for payment source

## 2021-07-24 NOTE — Telephone Encounter
Charles Walls calls in states that last month he had an abscess on his buttock that had to be drained and now he has one in his groin. Wanted to know  If there was someone who could take a look at it. Explained we don't have anyone here that would be able to do a walk in visit, would need to go to urgent care or emergency department for eval. Provided location for urgent care. He voiced understanding.

## 2021-07-28 ENCOUNTER — Encounter: Admit: 2021-07-28 | Discharge: 2021-07-28 | Payer: No Typology Code available for payment source

## 2021-07-28 NOTE — Progress Notes
6/14/232 clinic note and letter of medical necessity for CT appeal mailed to [employer] [place], Level II claim dispute, [place], [place] via [place], tracking [phone]

## 2021-07-31 ENCOUNTER — Encounter: Admit: 2021-07-31 | Discharge: 2021-07-31 | Payer: No Typology Code available for payment source

## 2021-08-03 ENCOUNTER — Encounter: Admit: 2021-08-03 | Discharge: 2021-08-03 | Payer: No Typology Code available for payment source

## 2021-08-03 DIAGNOSIS — T1491XA Suicide attempt, initial encounter: Secondary | ICD-10-CM

## 2021-08-03 DIAGNOSIS — L03317 Cellulitis of buttock: Secondary | ICD-10-CM

## 2021-08-03 DIAGNOSIS — L02414 Cutaneous abscess of left upper limb: Secondary | ICD-10-CM

## 2021-08-03 DIAGNOSIS — K5792 Diverticulitis of intestine, part unspecified, without perforation or abscess without bleeding: Secondary | ICD-10-CM

## 2021-08-03 DIAGNOSIS — L409 Psoriasis, unspecified: Secondary | ICD-10-CM

## 2021-08-03 DIAGNOSIS — A419 Sepsis, unspecified organism: Secondary | ICD-10-CM

## 2021-08-03 DIAGNOSIS — B20 Human immunodeficiency virus [HIV] disease: Secondary | ICD-10-CM

## 2021-08-03 DIAGNOSIS — K219 Gastro-esophageal reflux disease without esophagitis: Secondary | ICD-10-CM

## 2021-08-03 DIAGNOSIS — L0390 Cellulitis, unspecified: Secondary | ICD-10-CM

## 2021-08-03 DIAGNOSIS — L03116 Cellulitis of left lower limb: Secondary | ICD-10-CM

## 2021-08-03 DIAGNOSIS — E785 Hyperlipidemia, unspecified: Secondary | ICD-10-CM

## 2021-08-03 DIAGNOSIS — S129XXA Fracture of neck, unspecified, initial encounter: Secondary | ICD-10-CM

## 2021-08-05 ENCOUNTER — Encounter: Admit: 2021-08-05 | Discharge: 2021-08-05 | Payer: No Typology Code available for payment source

## 2021-08-06 ENCOUNTER — Encounter: Admit: 2021-08-06 | Discharge: 2021-08-06 | Payer: No Typology Code available for payment source

## 2021-08-06 ENCOUNTER — Ambulatory Visit: Admit: 2021-08-06 | Discharge: 2021-08-06 | Payer: No Typology Code available for payment source

## 2021-08-06 DIAGNOSIS — I700 Atherosclerosis of aorta: Secondary | ICD-10-CM

## 2021-08-07 ENCOUNTER — Encounter: Admit: 2021-08-07 | Discharge: 2021-08-07 | Payer: No Typology Code available for payment source

## 2021-08-07 DIAGNOSIS — I351 Nonrheumatic aortic (valve) insufficiency: Secondary | ICD-10-CM

## 2021-08-12 ENCOUNTER — Encounter: Admit: 2021-08-12 | Discharge: 2021-08-12 | Payer: No Typology Code available for payment source

## 2021-08-12 NOTE — Telephone Encounter
08-12-2021 Per Task Message, no records needed, clp    No outside records to collect

## 2021-08-18 ENCOUNTER — Encounter: Admit: 2021-08-18 | Discharge: 2021-08-18 | Payer: No Typology Code available for payment source

## 2021-08-18 DIAGNOSIS — R931 Abnormal findings on diagnostic imaging of heart and coronary circulation: Secondary | ICD-10-CM

## 2021-08-18 DIAGNOSIS — I359 Nonrheumatic aortic valve disorder, unspecified: Secondary | ICD-10-CM

## 2021-08-18 NOTE — Progress Notes
CARDIAC NEW PATIENT PROFILE    PHYSICIANS INFORMATION:   REFERRING PHYSICIAN: Lelon Frohlich, MD   PCP: Laren Everts, MD   OTHER PROVIDERS: Georges Mouse, MD, [facility]    REASON FOR VISIT/DIAGNOSIS: Internal referral from Dr Lelon Frohlich, MD for aortic valve regurgitation noted on Echo 08/06/2021.     RECENT EVENTS/SYMPTOMS:  Patient had a CT Chest completed at [facility] on 07/09/2021 that showed extensive aortic valve leaflet calcifications and Echocardiogram recommended.     PERTINENT CARDIAC HISTORY: hyperlipidemia; cardiac calcium score: 198; tobacco abuse;     OTHER MEDICAL HISTORY: recent cellulitis of left buttock;renal cell cancer/left partial nephrectomy in 2019; HIV; history of IV Methamphetamine use; primary thrombophilia; pulmonary emphysema; history of superficial vein thrombosis of the left upper extremity; inguinal lymphadenopathy; alcohol use disorder; syphilis    MOST RECENT PERTINENT TESTING/PROCEDURES:     05/21/2020-CT Chest/Cardiac Calcium Score: Total calcium score: 198. Dense aortic valve calcification.  07/09/2021-CT  Chest: Extensive aortic valve leaflet calcifications in keeping with known bicuspid aortic valve. Consider repeat echocardiogram to exclude underlying aortic stenosis given degree of calcifications.   08/06/2021-ECHO: No regional wall motion abnormalities are seen. Overall left ventricular systolic function appears normal and dynamic. The estimated left ventricular ejection fraction is 65%. Normal left ventricular diastolic function. Right ventricular chamber dimensions and contractility appear normal. Normal atrial chamber dimensions. The aortic valve appears bicuspid with fusion of the left and right coronary cusps and commissures at the 4:00 and 9:00 positions on short axis imaging. The valve is moderately sclerotic and mild to moderate aortic valve regurgitation is noted on Doppler exam. The aortic root and the visualized portions of the ascending aorta appear normal in size. There is no evidence for aortic coarctation. No pericardial effusion is seen.    PERTINENT SURGERIES: Partial left nephrectomy in 2019.     MEDICATIONS/LAB: patient follows internally    Chart Book Marked.

## 2021-08-20 ENCOUNTER — Ambulatory Visit: Admit: 2021-08-20 | Discharge: 2021-08-20 | Payer: No Typology Code available for payment source

## 2021-08-20 ENCOUNTER — Encounter: Admit: 2021-08-20 | Discharge: 2021-08-20 | Payer: No Typology Code available for payment source

## 2021-08-20 DIAGNOSIS — A419 Sepsis, unspecified organism: Secondary | ICD-10-CM

## 2021-08-20 DIAGNOSIS — I358 Other nonrheumatic aortic valve disorders: Secondary | ICD-10-CM

## 2021-08-20 DIAGNOSIS — R42 Dizziness and giddiness: Secondary | ICD-10-CM

## 2021-08-20 DIAGNOSIS — E782 Mixed hyperlipidemia: Secondary | ICD-10-CM

## 2021-08-20 DIAGNOSIS — I351 Nonrheumatic aortic (valve) insufficiency: Secondary | ICD-10-CM

## 2021-08-20 DIAGNOSIS — R0609 Other forms of dyspnea: Secondary | ICD-10-CM

## 2021-08-20 DIAGNOSIS — R0989 Other specified symptoms and signs involving the circulatory and respiratory systems: Secondary | ICD-10-CM

## 2021-08-20 DIAGNOSIS — S129XXA Fracture of neck, unspecified, initial encounter: Secondary | ICD-10-CM

## 2021-08-20 DIAGNOSIS — A64 Unspecified sexually transmitted disease: Secondary | ICD-10-CM

## 2021-08-20 DIAGNOSIS — L03116 Cellulitis of left lower limb: Secondary | ICD-10-CM

## 2021-08-20 DIAGNOSIS — L409 Psoriasis, unspecified: Secondary | ICD-10-CM

## 2021-08-20 DIAGNOSIS — B20 Human immunodeficiency virus [HIV] disease: Secondary | ICD-10-CM

## 2021-08-20 DIAGNOSIS — T1491XA Suicide attempt, initial encounter: Secondary | ICD-10-CM

## 2021-08-20 DIAGNOSIS — K219 Gastro-esophageal reflux disease without esophagitis: Secondary | ICD-10-CM

## 2021-08-20 DIAGNOSIS — I38 Endocarditis, valve unspecified: Secondary | ICD-10-CM

## 2021-08-20 DIAGNOSIS — K5792 Diverticulitis of intestine, part unspecified, without perforation or abscess without bleeding: Secondary | ICD-10-CM

## 2021-08-20 DIAGNOSIS — F32A Depression: Secondary | ICD-10-CM

## 2021-08-20 DIAGNOSIS — L02414 Cutaneous abscess of left upper limb: Secondary | ICD-10-CM

## 2021-08-20 DIAGNOSIS — E785 Hyperlipidemia, unspecified: Secondary | ICD-10-CM

## 2021-08-20 DIAGNOSIS — L0390 Cellulitis, unspecified: Secondary | ICD-10-CM

## 2021-08-20 DIAGNOSIS — L03317 Cellulitis of buttock: Secondary | ICD-10-CM

## 2021-08-20 DIAGNOSIS — R000 Tachycardia, unspecified: Secondary | ICD-10-CM

## 2021-08-20 MED ORDER — ASPIRIN 81 MG PO CHEW
81 mg | ORAL_TABLET | Freq: Every day | ORAL | 1 refills | Status: AC
Start: 2021-08-20 — End: ?

## 2021-08-20 MED ORDER — METOPROLOL SUCCINATE 25 MG PO TB24
25 mg | ORAL_TABLET | Freq: Every day | ORAL | 1 refills | 90.00000 days | Status: AC
Start: 2021-08-20 — End: ?

## 2021-08-20 NOTE — Progress Notes
Date of Service: 08/20/2021    Charles ORTH is a 51 y.o. male.       Chief complaint: Aortic insufficiency    HPI    Charles Walls is a very pleasant 51 year old gentleman, retired Associate Professor, presenting to our institution for evaluation of his aortic valve insufficiency and abnormal echocardiogram.  His history is remarkable for HIV infection, prior syphilis infection, mixed hyperlipidemia.  He was recently offered an echocardiogram for worsening shortness of breath where he was noted to have a bicuspid aortic valve with sclerosis without stenosis and mild to moderate insufficiency.  He has been referred to [place] for work-up of his aortic valve.  He has also had a CT of his chest and that has demonstrated no significant concomitant aortopathy.  He reports no significant anginal symptoms.  He works in his yard for 20 to 30 hours a week.  He states that none of this really causes him any significant anginal symptoms but even showering makes him real short of breath which appears to be a new finding.  No history of PND, orthopnea, palpitations, near-syncope Walls syncope Walls sudden cardiac arrest.  No prior history of coronary artery disease.  He has been placed on statin therapy for elevated LDL levels and also has an elevated coronary artery calcium score of 198 Agatston units diagnosed in May 2022.  Overall, he has had a decline in his functional tolerance without anginal symptoms.  He has no history of prior myocardial infarction's, PCI Walls bypass grafting.  No bleeding complications noted.  No aspirin Walls iodine contrast allergy.    Last known echocardiogram: Images reviewed: Normal biventricular function.  No wall motion abnormality.  Further discussion regarding aortic valve will be done in the assessment and plan  Last known cardiac catheterization: Never had one done.  Last known stress test: None in the system.    Assessment    1.  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  Bicuspid aortic valve  3. Mild-moderate aortic valve insufficiency  4.  Dyspnea on exertion  5.  Mixed hyperlipidemia  6.  Elevated coronary artery calcium score    51 year old gentleman with multiple atherosclerotic risk factors such as age, male gender, hyperlipidemia, HIV infection and an elevated coronary artery calcium score of 198 Agatston units presenting with worsening functional tolerance.  He is barely able to shower without significant dyspnea.  Clinically he is euvolemic.  He is presently not on any loop diuretics.  No significant anginal symptoms reported by the patient.    Recommendations    1.  Aspirin 81 mg daily for primary prevention purposes.  Low bleeding risk candidate.  2.  Toprol-XL 25 mg once daily for improving diastolic function.  3.  I reviewed his echocardiogram images: This is a bicuspid aortic valve: Sievers 1 with a right/left effusion.  No systolic excursion restriction noted on short axis images.  However, his peak velocities are close to 2.1 m/s with an acceptable gradient and an aortic valve area of greater than 1 cm? indicating of aortic valve sclerosis without significant stenosis.  Extent of aortic insufficiency is mild on color Doppler examination.  Pressure half-time's were between 272 ms and high 300s.  No holodiastolic flow reversal noted in the aorta.  Overall, the extent of aortic insufficiency is mild-moderate and his LV cavity dimensions are within normal limits with normal LV function.  Presently does not meet criteria for aortic valve intervention.  4.  Repeat 2D echo with Doppler in 1  year to assess progression of aortic stenosis/insufficiency  5.  I also reviewed his CTA which demonstrates no significant dilation of the ascending Walls descending thoracic aorta.  Concomitant aortopathy secondary to bicuspid aortic valve and a prior syphilis infection are absent.  6.  Overall, his dyspnea is not completely explained by the extent of aortic insufficiency which appears underwhelming.  7.  Given his risk factors and worsening exercise tolerance, recommend pharmacological myocardial perfusion imaging stress testing to rule out underlying obstructive coronary artery disease which could well be the cause of his worsening functional tolerance.  8.  Target LDL less than 70 mg/dL.  Currently well controlled on Lipitor.  No statin induced side effects.  No changes recommended.    Charles Cluck, MD, [facility], FSCAI  Interventional Cardiologist           Vitals:    08/20/21 0930   Height: 164.5 cm (5' 4.75)     Body mass index is 24.65 kg/m?Marland Kitchen     Past Medical History  Patient Active Problem List    Diagnosis Date Noted   ? Perirectal abscess 06/05/2021   ? History of syphilis 02/10/2021   ? 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 Immunodeficiency, unspecified (HCC) 01/23/2021   ? Pulmonary emphysema, unspecified emphysema type (HCC) 01/23/2021   ? 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 Lymphadenopathy 08/26/2020   ? Agatston coronary artery calcium score between 100 and 199 07/09/2020     05/21/2020-CT Chest/Cardiac Calcium Score: Total calcium score: 198. Dense aortic valve calcification.     ? 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  Last used 2 months ago     ? Lymphadenopathy, inguinal 01/11/2018     He reported to the [facility] ED in Nov with inguinal adenopathy and penile ulcer  He had recent penile piercing removed  He is homosexual with sexual relationships with his husband via anal intercourse; he is the penetrating partner  He also has history of HIV and syphillis  He was discharged on treatment of STI  Then reported to [facility] ED where he had a CT showing inguinal lymphadenopathy    Exam today showed an right inguinal abscess and induration below  No other lymphadenopathy Walls penile lesions noted     ? 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 Renal cell cancer, left (HCC) 02/04/2017     05/09/15 - 3.2 cm non-enhancing left superior pole renal mass  02/01/17 - 3.8 cm non-enhancing  06/14/17: left robotic partial: pT1b ccRCC grade 2  11/2017 CT and 12/2017 RUS: Normal kidneys without recurrence       ? Calcification of aortic valve 02/04/2017     08/06/2021-ECHO: No regional wall motion abnormalities are seen. Overall left ventricular systolic function appears normal and dynamic. The estimated left ventricular ejection fraction is 65%. Normal left ventricular diastolic function. Right ventricular chamber dimensions and contractility appear normal. Normal atrial chamber dimensions. The aortic valve appears bicuspid with fusion of the left and right coronary cusps and commissures at the 4:00 and 9:00 positions on short axis imaging. The valve is moderately sclerotic and mild to moderate aortic valve regurgitation is noted on Doppler exam. The aortic root and the visualized portions of the ascending aorta appear normal in size. There  is no evidence for aortic coarctation. No pericardial effusion is seen.       ? HIV (human immunodeficiency virus infection) (HCC) 06/13/2013   ? Hyperlipidemia 06/13/2013   ? Tobacco abuse 06/13/2013         Review of Systems   Constitutional: Negative.   HENT: Negative.    Eyes: Negative.    Cardiovascular: Positive for chest pain and dyspnea on exertion.   Respiratory: Negative.    Endocrine: Negative.    Hematologic/Lymphatic: Negative.    Skin: Negative.    Musculoskeletal: Negative.    Gastrointestinal: Negative.    Genitourinary: Negative.    Neurological: Positive for headaches.   Psychiatric/Behavioral: Negative.    Allergic/Immunologic: Negative.        Physical Exam  General Appearance: moderately overweight, no distress   Skin: warm, no ulcers Walls xanthomas; few ecchymoses   Eyes: conjunctivae and lids normal, pupils are equal and round Neck Veins: normal JVP   Thyroid: no nodules, masses, tenderness Walls enlargement   Cardiovascular system: Pulse 86/min, regular rhythm , normal volume, no specific character, felt equally in all peripheries and there was no radio femoral delay.  PMI undisplaced, no palpable thrills/heaves, S1 S2 heard, no murmurs, no rub, no carotid bruit.  Peripheral Circulation: normal peripheral circulation   Pedal Pulses: normal symmetric pedal pulses   Carotid Arteries: normal carotid upstroke bilaterally, no bruits   Respiratory system: No acute distress  No use of accessory muscles  Normal vesicular breath sounds over all the lung fields bilaterally  No added sounds      Cardiovascular Health Factors  Vitals BP Readings from Last 3 Encounters:   08/06/21 100/72   08/03/21 102/70   07/01/21 113/75     Wt Readings from Last 3 Encounters:   08/06/21 66.7 kg (147 lb)   08/03/21 66.8 kg (147 lb 4.8 oz)   07/01/21 64.9 kg (143 lb)     BMI Readings from Last 3 Encounters:   08/20/21 24.65 kg/m?   08/06/21 24.65 kg/m?   08/03/21 24.70 kg/m?      Smoking Social History     Tobacco Use   Smoking Status Every Day   ? Packs/day: 0.50   ? Years: 36.00   ? Pack years: 18.00   ? Types: Cigarettes   ? Start date: 40   ? Passive exposure: Current   Smokeless Tobacco Never   Vaping Use   ? Vaping Use: Never used      Lipid Profile Cholesterol   Date Value Ref Range Status   07/09/2021 120 <200 MG/DL Final     HDL   Date Value Ref Range Status   07/09/2021 39 (L) >40 MG/DL Final     LDL   Date Value Ref Range Status   07/09/2021 74 <100 mg/dL Final     Triglycerides   Date Value Ref Range Status   07/09/2021 139 <150 MG/DL Final      Blood Sugar Hemoglobin A1C   Date Value Ref Range Status   02/06/2021 5.4 4.0 - 6.0 % Final     Comment:     The ADA recommends that most patients with type 1 and type 2 diabetes maintain   an A1c level <7%.       Glucose   Date Value Ref Range Status   06/05/2021 97 70 - 100 MG/DL Final   45/40/9811 95 70 - 100 MG/DL Final   91/47/8295 621 (H) 70 - 100 MG/DL Final  03/25/2005 92 70 - 110 MG/DL Final   16/10/9602 540 70 - 110 MG/DL Final     Glucose, POC   Date Value Ref Range Status   06/30/2017 104 (H) 70 - 100 MG/DL Final   98/11/9145 92 70 - 100 MG/DL Final          Current Medications (including today's revisions)  ? acetaminophen (TYLENOL EXTRA STRENGTH) 500 mg tablet Take one tablet to two tablets by mouth every 6 hours as needed for Pain. Max of 4,000 mg of acetaminophen in 24 hours.   ? atorvastatin (LIPITOR) 10 mg tablet Take one tablet by mouth daily.   ? cyanocobalamin (vitamin B-12) 100 mcg tablet Take one tablet by mouth daily.   ? sildenafiL (VIAGRA) 50 mg tablet TAKE 1 TABLET BY MOUTH AS NEEDED FOR ERECTILE DYSFUNCTION   ? TRIUMEQ 600-50-300 mg tablet Take 1 Tablet by mouth once daily   ? valACYclovir (VALTREX) 1 gram tablet Take 2 Tablets by mouth every 12 (twelve) hours for 4 days for outbreak

## 2021-08-20 NOTE — Patient Instructions
Follow-Up:    -Thank you for allowing [place] to participate in your care today. Your After Visit Summary is being completed by Selinda Eon, RN.    -We would like you to follow up in  1 years with [name], MD with an ECHO  -The schedule is released approximately 4-5 months in advance. You will be called by our scheduling department to make an appointment and you will also receive a notification via MyChart to self-schedule.  However, if you would like to call to make this appointment, please call [phone].    -Please schedule the following testing at check out, or by calling our scheduling line: STRESS TEST  [facility]    Nuclear Stress Test Instructions    Your cardiologist has asked that you have a nuclear stress test (also known as a Myocardial Perfusion Imaging (MPI) test.    This evaluation of your heart muscle consists of two sets of nuclear images and either a Treadmill stress test or a chemical stress test, decided by you and your physician.    You will get an IV placed in your arm for the test.    You will need to be able to raise your arm up by your head for about 20 minutes and lie on your back for about 10 minutes.  Please discuss this with your doctor or talk with the nuclear technologist or nurse if these are a problem for you.    It is recommended not to schedule any other appointment for the same day.      Wear comfortable clothing and walking shoes if you are walking on the treadmill.  Women should wear shorts or comfortable pants instead of dresses.   Sweatshirts or T-shirts work really well for imaging.    There may be enough time to leave to get a snack after the stress portion of the test.   The Technologist will give you a list of appropriate types of food you may eat and tell you what time to return for second set of images.    PLEASE NO CAFFEINE 24 HOURS PRIOR TO TEST:  Examples include coffee, tea, decaffeinated drinks, colas, [facility] Dr. Reino Kent.  Some orange sodas and root beers have caffeine, please check.  No energy drinks, Excedrin, Midol, or any foods CONTAINING CHOCOLATE.   Consuming Caffeine may postpone your test.    PLEASE DO NOT EAT OR DRINK THE MORNING OF YOUR TEST.  Water is ok to drink with your morning medications.      DIABETIC PATIENTS:  if insulin dependent:  please take one third of your insulin with two pieces of dry toast and a small juice.  Bring remaining 2/3   insulin and oral diabetic medications with you to your test.    PLEASE NO TOBBACCO PRODUCTS BETWEEN SCANS  You will NOT need a driver for this test.  But are welcome to bring a visitor with you.   Visitors will not be able to accompany you back to stress room.   Please do not bring children to the nuclear stress test.    TEST FINDINGS:   You will receive the results of the test within 7 business days of completion of this test by telephone. If you have any questions concerning your nuclear stress test or if you do not hear from your cardiologist/or nurse with 7 business days, please call our office.    Changes From Today's Office Visit  Dr Donnie Coffin would like  you start taking 81 mg asprin and Toprol XL 25mg  once daily.      Contacting our office:    -For NON-URGENT questions please contact us via message through your MyChart account.   -For all medication refills please contact your pharmacy or send a request through MyChart.     -For all questions that may need to be addressed urgently please call the [facility] nursing triage line at [phone] Monday - Friday 8am-5pm only. Please leave a detailed message with your name, date of birth, and reason for your call.  If your message is received before 3:30pm, every effort will be made to call you back the same day.  Please allow time for [place] to review your chart prior to call back.     -Should you have an urgent concern over the weekend/nights, the on-call triage line is [phone].    [facility] nursing team fax number: [phone]    -You may receive a survey in the upcoming weeks from [facility] [place] [facility]. Your feedback is important to [place] and helps [place] continue to improve patient care and patient satisfaction.     -Please feel free to call our Financial Department at [phone] with any questions or concerns about estimated cost of testing or imaging ordered today. We are happy to provide CPT codes upon request.    Results & Testing Follow Up:    -Please allow 5-7 business days for the results of any testing to be reviewed. Please call our office if you have not heard from a nurse within this time frame.    -Should you choose to complete testing at an outside facility, please contact our office after completion of testing so that we can ensure that we have received results for your provider to review.    Lab and test results:  As a part of the CARES act, starting 04/19/2019, some results will be released to you via MyChart immediately and automatically.  You may see results before your provider sees them; however, your provider will review all these results and then they, or one of their team, will notify you of result information and recommendations.   Critical results will be addressed immediately, but otherwise, please allow [place] time to get back with you prior to you reaching out to [place] for questions.  This will usually take about 72 hours for labs and 5-7 days for procedure test results.

## 2021-08-31 ENCOUNTER — Encounter: Admit: 2021-08-31 | Discharge: 2021-08-31 | Payer: No Typology Code available for payment source

## 2021-09-02 ENCOUNTER — Encounter: Admit: 2021-09-02 | Discharge: 2021-09-02 | Payer: No Typology Code available for payment source

## 2021-09-02 ENCOUNTER — Ambulatory Visit: Admit: 2021-09-02 | Discharge: 2021-09-02 | Payer: No Typology Code available for payment source

## 2021-09-02 DIAGNOSIS — R0609 Other forms of dyspnea: Secondary | ICD-10-CM

## 2021-09-02 MED ORDER — SODIUM CHLORIDE 0.9 % IV SOLP
250 mL | INTRAVENOUS | 0 refills | Status: AC | PRN
Start: 2021-09-02 — End: ?

## 2021-09-02 MED ORDER — RP DX TC-99M TETROFOSMIN MCI
6 | Freq: Once | INTRAVENOUS | 0 refills | Status: CP
Start: 2021-09-02 — End: ?
  Administered 2021-09-02: 19:00:00 6.4 via INTRAVENOUS

## 2021-09-02 MED ORDER — AMINOPHYLLINE 25 MG/ML IV SOLN
50 mg | INTRAVENOUS | 0 refills | Status: AC | PRN
Start: 2021-09-02 — End: ?

## 2021-09-02 MED ORDER — EUCALYPTUS-MENTHOL MM LOZG
1 | Freq: Once | ORAL | 0 refills | Status: AC | PRN
Start: 2021-09-02 — End: ?

## 2021-09-02 MED ORDER — NITROGLYCERIN 0.4 MG SL SUBL
.4 mg | SUBLINGUAL | 0 refills | Status: AC | PRN
Start: 2021-09-02 — End: ?

## 2021-09-02 MED ORDER — ALBUTEROL SULFATE 90 MCG/ACTUATION IN HFAA
2 | RESPIRATORY_TRACT | 0 refills | Status: AC | PRN
Start: 2021-09-02 — End: ?

## 2021-09-02 MED ORDER — REGADENOSON 0.4 MG/5 ML IV SYRG
.4 mg | Freq: Once | INTRAVENOUS | 0 refills | Status: CP
Start: 2021-09-02 — End: ?
  Administered 2021-09-02: 18:00:00 0.4 mg via INTRAVENOUS

## 2021-09-04 ENCOUNTER — Encounter: Admit: 2021-09-04 | Discharge: 2021-09-04 | Payer: No Typology Code available for payment source

## 2021-09-04 DIAGNOSIS — Q231 Congenital insufficiency of aortic valve: Secondary | ICD-10-CM

## 2021-09-04 NOTE — Telephone Encounter
-----   Message from Alton Revere, MD sent at 09/03/2021  8:57 PM CDT -----  No evidence of significant blockages based upon stress test findings  Ejection fraction was within normal limits  No high risk features are present  Overall reassuring findings.  Needs annual follow-up for bicuspid aortic valve.  Suggest follow-up with me in 1 year after a 2D/Doppler is obtained.  Thank you.

## 2021-09-04 NOTE — Telephone Encounter
Spoke with patient and gave results and recommendations.

## 2021-09-08 ENCOUNTER — Encounter: Admit: 2021-09-08 | Discharge: 2021-09-08 | Payer: No Typology Code available for payment source

## 2021-09-11 ENCOUNTER — Encounter: Admit: 2021-09-11 | Discharge: 2021-09-11 | Payer: No Typology Code available for payment source

## 2021-09-11 DIAGNOSIS — I359 Nonrheumatic aortic valve disorder, unspecified: Secondary | ICD-10-CM

## 2021-09-11 DIAGNOSIS — E782 Mixed hyperlipidemia: Secondary | ICD-10-CM

## 2021-09-11 DIAGNOSIS — B20 Human immunodeficiency virus [HIV] disease: Secondary | ICD-10-CM

## 2021-09-11 DIAGNOSIS — R931 Abnormal findings on diagnostic imaging of heart and coronary circulation: Secondary | ICD-10-CM

## 2021-09-11 MED ORDER — TRIUMEQ 600-50-300 MG PO TAB
1 | ORAL_TABLET | Freq: Every day | ORAL | 1 refills
Start: 2021-09-11 — End: ?

## 2021-09-11 MED ORDER — ATORVASTATIN 10 MG PO TAB
10 mg | ORAL_TABLET | Freq: Every day | ORAL | 0 refills | Status: AC
Start: 2021-09-11 — End: ?

## 2021-09-11 NOTE — Telephone Encounter
LOV: 02/20/2021  NOV: No follow up scheduled  Labs last drawn on: 06/05/21  HIV Viral Load: 4980    Routing to Dr. Mayra Reel for approval.   Curly [place], RN

## 2021-09-17 ENCOUNTER — Encounter: Admit: 2021-09-17 | Discharge: 2021-09-17 | Payer: No Typology Code available for payment source

## 2021-09-18 ENCOUNTER — Encounter: Admit: 2021-09-18 | Discharge: 2021-09-18 | Payer: No Typology Code available for payment source

## 2021-09-18 MED ORDER — SILDENAFIL 50 MG PO TAB
ORAL_TABLET | ORAL | 2 refills | 45.00000 days | Status: AC
Start: 2021-09-18 — End: ?

## 2021-09-22 ENCOUNTER — Encounter: Admit: 2021-09-22 | Discharge: 2021-09-22 | Payer: No Typology Code available for payment source

## 2021-09-22 DIAGNOSIS — B20 Human immunodeficiency virus [HIV] disease: Secondary | ICD-10-CM

## 2021-09-22 NOTE — Progress Notes
I called patient to come in this week for labs as last VL was elevated. He reports taking Triumeq daily.  He verbalized understanding.

## 2021-10-26 ENCOUNTER — Encounter: Admit: 2021-10-26 | Discharge: 2021-10-26 | Payer: No Typology Code available for payment source

## 2021-11-04 ENCOUNTER — Encounter: Admit: 2021-11-04 | Discharge: 2021-11-04 | Payer: No Typology Code available for payment source

## 2021-11-04 ENCOUNTER — Ambulatory Visit: Admit: 2021-11-04 | Discharge: 2021-11-04 | Payer: No Typology Code available for payment source

## 2021-11-04 DIAGNOSIS — A64 Unspecified sexually transmitted disease: Secondary | ICD-10-CM

## 2021-11-04 DIAGNOSIS — B20 Human immunodeficiency virus [HIV] disease: Secondary | ICD-10-CM

## 2021-11-04 DIAGNOSIS — L03116 Cellulitis of left lower limb: Secondary | ICD-10-CM

## 2021-11-04 DIAGNOSIS — R000 Tachycardia, unspecified: Secondary | ICD-10-CM

## 2021-11-04 DIAGNOSIS — L02414 Cutaneous abscess of left upper limb: Secondary | ICD-10-CM

## 2021-11-04 DIAGNOSIS — K5792 Diverticulitis of intestine, part unspecified, without perforation or abscess without bleeding: Secondary | ICD-10-CM

## 2021-11-04 DIAGNOSIS — S129XXA Fracture of neck, unspecified, initial encounter: Secondary | ICD-10-CM

## 2021-11-04 DIAGNOSIS — R42 Dizziness and giddiness: Secondary | ICD-10-CM

## 2021-11-04 DIAGNOSIS — E785 Hyperlipidemia, unspecified: Secondary | ICD-10-CM

## 2021-11-04 DIAGNOSIS — T1491XA Suicide attempt, initial encounter: Secondary | ICD-10-CM

## 2021-11-04 DIAGNOSIS — L03317 Cellulitis of buttock: Secondary | ICD-10-CM

## 2021-11-04 DIAGNOSIS — I38 Endocarditis, valve unspecified: Secondary | ICD-10-CM

## 2021-11-04 DIAGNOSIS — L409 Psoriasis, unspecified: Secondary | ICD-10-CM

## 2021-11-04 DIAGNOSIS — L0390 Cellulitis, unspecified: Secondary | ICD-10-CM

## 2021-11-04 DIAGNOSIS — F32A Depression: Secondary | ICD-10-CM

## 2021-11-04 DIAGNOSIS — K219 Gastro-esophageal reflux disease without esophagitis: Secondary | ICD-10-CM

## 2021-11-04 DIAGNOSIS — A419 Sepsis, unspecified organism: Secondary | ICD-10-CM

## 2021-11-04 LAB — COMPREHENSIVE METABOLIC PANEL
ALK PHOSPHATASE: 79 U/L (ref 25–110)
ALT: 12 U/L (ref >40)
BLD UREA NITROGEN: 19 mg/dL (ref 7–25)
CALCIUM: 9.7 mg/dL (ref 8.5–10.6)
CREATININE: 1 mg/dL (ref 0.4–1.24)
GLUCOSE,PANEL: 98 mg/dL (ref 70–100)
POTASSIUM: 4.1 MMOL/L (ref 3.5–5.1)
SODIUM: 137 MMOL/L (ref 137–147)
TOTAL BILIRUBIN: 0.3 mg/dL (ref 0.3–1.2)
TOTAL PROTEIN: 7.3 g/dL (ref 6.0–8.0)

## 2021-11-04 LAB — HIV VIRAL LOAD PCR QUANT

## 2021-11-04 LAB — RPR TITER FOR TREATMENT

## 2021-11-04 LAB — RPR TITER-QUANT: RPR TITER: 2

## 2021-11-04 NOTE — Progress Notes
Subjective:     History of Present Illness    Charles Walls is a 51 y.o. male here for HIV follow up.  ?  Patient has been followed by Dr Charles Walls previously until 2014 when he switched to [facility]. He was following Dr Charles Walls until she closed her outpatient practice and he transferred to me. Prior regimens Kaletra, Presizta, Viracept (diarrhea), Viread, Truvada, Combivir.   He reports he was diagnosed with HIV in 2000 while in [facility] (risk MSM). He then moved to [place] for few years and came back in 2006. He had relationship with women in the past, but was married to an HIV negative male. They are using condoms except for oral sex. They have no other partners for the past 2 years. Charles Walls was diagnosed with Chlamydia in June 2014 and treated. His partner was also treated. He continues on Darunavir, Truvada, Norvir and has good virological and immunological response. He said he was seen q30months by his [place] doctor and would like this way. Currently feeling well. His feet are better after he started Kelfex and topical creams prescribed by Dermatology last week. He is enrolled in RWSW at [facility] [place], and also has Media planner. 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 He is tested for TB there and has always been negative. Still smokes 1 ppd and is interested in quitting but waiting for change in insurance in March to get discount on his premium.        04/06/17  He was admitted from 02/01/17 through 02/04/17 with diverticulitis. 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 He is scheduled for partial nephrectomy in March 2019. Currently he is anxious for his renal mass lesion and worries about his right shoulder pain, that he describes chronic and due to a rotator cuff injuries. He quit smoking 2 months ago and using Vapor currently. For HIV, he is taking Triumeq without any missed dose, good control. He admitted that he is sexually active with 10 years younger person who was recently diagnosed as Syphilis.     02/07/19  Here for follow up for HIV. Has had difficult few months. He has been going through a divorce with his husband that happened in March. Since then has had multiple sexual partners. One of those partners may have given his syphilis, he got a single dose for primary syphilis at the health department and tested negative according to him. Was off his medications for a couple of months when he went through the divorce but is now back on them and has been compliant. 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 Was recently in the ED due to a cellulitis and an abscess from the injections. Blood cultures at that time were negative, abscess was I&D but was not sent for cultures. Prescribed 10 days of clindamycin. States that his arm has improved but developed loose stools, 2 per day. He states that he does not share needles, does not reuse needles. Uses a needle exchange program.     09/19/20  Patient comes in for follow-up.  He has missed his last appointment as he was being evaluated for cervical lymphadenopathy with concern for malignancy.  He underwent initially an FNA on 07/29/2020 which was concerning for atypical lymphocytes.  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  Patient was seen by hematology Dr. Lucius Walls who  ordered CT abdomen pelvis and chest for complete evaluation.  Patient is yet to do those scans. Patient has been sexually active and on 09/12/2020 had a positive urine NAAT for gonorrhea when he presented for evaluation of dysuria. Also patient did report some rectal discomfort today and he was not sure if it is from the enemas he is using.  He is self collected a rectal swab for GC chlamydia testing. He will receive Rocephin an Azithromycin today in clinic.  I also offered him the monkey box vaccine and he elected to take the first dose as well.  Last HIV viral load on 09/19/2020 was not detected, and CD4 count was 918 (41.2%).  Patient reported doing well on Triumeq.    02/20/21  Follow up from recent hospitalization with left arm group a strep soft tissue infection requiring an I&D and drainage of an abscess.  He was on IV antibiotics was changed to oral Keflex for 10 more days on discharge on 02/09/2021. He returns to clinic today reporting improvement in the left wrist infection site currently nontender.  Swelling has resolved. He has some residual pain in the left elbow as well and with range of motion of the left wrist. Reports good compliance with Triumeq.  Of note while hospitalized his viral load was about 5000 copies per mL. Genotype done showed wild-type virus.  He had prior minor NNRTI and PI mutations but no M184V or rilpivirine resistance. He is asking about Cabenuva.  Also reports to having a new boyfriend however they are not sexually active yet.  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  RPR on 02/06/2021 was 16 received 1 dose of Bicillin LA.  Devious RPR on 09/19/2020 was 4.    11/04/21  He is doing well on Triumeq.  Patient was hospitalized in May 2023 with an MRSA buttock abscess that requires incision and drainage.  The area has healed since then. He went to the lab this morning before the clinic visit labs are pending. Got Flu and Covid vaccines today. No diarrhea, skin itching or skin infection currently.         Review of Systems   Constitutional: Negative.    Respiratory: Negative for shortness of breath.    Gastrointestinal: Positive for diarrhea.   Musculoskeletal: Positive for arthralgias.       Objective:         ? acetaminophen (TYLENOL EXTRA STRENGTH) 500 mg tablet Take one tablet to two tablets by mouth every 6 hours as needed for Pain. Max of 4,000 mg of acetaminophen in 24 hours.   ? aspirin (ASPIRIN CHILDRENS) 81 mg chewable tablet Chew one tablet by mouth daily.   ? atorvastatin (LIPITOR) 10 mg tablet Take one tablet by mouth daily.   ? metoprolol succinate XL (TOPROL XL) 25 mg extended release tablet Take one tablet by mouth daily.   ? sildenafiL (VIAGRA) 50 mg tablet TAKE 1 TABLET BY MOUTH AS NEEDED FOR ERECTILE DYSFUNCTION   ? TRIUMEQ 600-50-300 mg tablet Take 1 Tablet by mouth once daily   ? valACYclovir (VALTREX) 1 gram tablet Take 2 Tablets by mouth every 12 (twelve) hours for 4 days for outbreak     Vitals:    11/04/21 1129   BP: (!) 132/90   Pulse: 113   Temp: 36.4 ?C (97.5 ?F)   Resp: 20   SpO2: 99%   PainSc: Zero   Weight: 67.9 kg (149 lb 9.6 oz)   Height: 162.6 cm (5' 4)  Body mass index is 25.68 kg/m?Marland Kitchen     Physical Exam  Constitutional: No distress.   HENT: unremarkable  Head: Normocephalic and atraumatic.   Eyes: No scleral icterus.    Pulmonary/Chest: Effort normal.   Neurological: He is alert.  Skin: No rash, healing left wrist wound.  Psychiatric: He has a normal mood and affect.   Vitals reviewed.       Assessment:  HIV   ? Diagnosed 2000 while in [facility]. Dr Jon Billings [facility]).   ? Establishment of care with [facility] : 07/31/13  ? Mode of transmission / risk factors : MSM  ? CD4 count / viral load at the time of diagnosis : not sure , likely CD 4 around 250  ? History of OI : none reported  ? Need for prophylaxis : none reported  ? FePO4 14% (within normal)  ?  Past HIV data Viral load/ CD4 count, ART therapy :  Date   CD4 ( % )  Viral load ( log)  ART therapy  Comments / genotype Resistence testing     11/28/13  In process  <40 (<1.6)  ? ?   08/27/13  993(48.7)  <40 (<1.6)  ? ?   06/13/13  ? 54 (1.73)  ([place])  ?   03/30/13  Not done  <20(<1.3)  ? ?   03/26/13  724(57%)?  24(1.380  ? ?   01/15/13  1124(48)  <20 (<1.3)  ? ?   10/23/12  1154  <20(<1.3)  ? ?   08/23/12  901  <20(<1.3)  ? ?   07/27/12  991  <20(<1.3)  Likely switched to new regimen daruavir and ritonavir instead of Kaletra  (mid 2014)    06/30/12  1052  <20 ND (<1.3)  ? ?   05/08/12  948(46)  3891.58  ? ?   03/27/12  Not done  22(1.34)  ? ? 03/13/12  Not done  31(1.4)  ? ?   02/25/12  879(47)  41(1.61)  ? ?   02/11/12  962(43)  34(1.53)  ? ?   10/27/12  1072(46)  61(1.79)  ? ?   01/14/12  849(50)  46(1.66)  Was on Keletra and Truvada  ?   12/23/11  936 (46)  53 (1.72)  ? ?   ?  Base line lab parameter / screening   ? TB test: T spot TB test negative on 07/31/13  ? Toxoplasma serology: IgG negative on 07/31/13  ? Hepatitis serology: 07/31/13  ? Hepatitis BS antigen: negative  ? Hepatitis BS antibody: 10.8. (immune)  ? Hepatitis B core antibody: negative  ? Hepatitis A antibody: positive  ? Hepatis C antibody: negative    Immunizations   ? Hepatitis vaccine : received both A&B: received booster Hepatitis B vaccine 10/01/13  ? Tdap vaccine : 05/11/13  ? Prevnar ( 07/31/13), PPSV 23 ( 10/01/13) --> PPSV23 booster needed 2020  ? Menveo in 2017 --> booster needed 2023  ? Annual flu shot  ? Mpox 09/2020 --> booster given 02/20/21  ? COVID-19 booster needed  ?  H/O STD / STD screening / anal pap/ risk factors   ? H/O STDs : H/O chlamydia 07/27/12 : treated  ? Syphilis test : negative / patient in 07/2012  ? Urine for GC / CT PCR : 07/31/13 Negative , syphilis negative  ? Anal pap smear : 2-3 years ago , negative / patient  ? On going risk factors :  ? H/O multiple sexual partners  in the past.  ? Separated from previous HIV-neg husband. Now new boyfriend   ? Anal pap discussed 02/2014. He declined.  ? Completed 3 doses of Bicillin LA on 09/01/16 RPR 2.  ? 09/19/20 RPR 4, 02/06/21 RPR 16 given 1 dose Bicillin LA  ?  Prolonged ART therapy / lab monitoring   ? UA 05/21/16 protein negative , glucose negative.  ? hemoglobin A1C 5.5 on 02/02/17.  ? Vitamin D level (on 05/21/16) 29.3: Continue OTC Vitamin D    Admit for Group A Strep L arm cellulitis/abscess    - s/p I&D and course of Abx   - 02/20/21 Follow up wound healed. Having diarrhea will check for C diff. Stopping Keflex.  ?  Other co morbidities   ? History of tinea pedis.  ? Intermittent chest pain  : Tobacco dependence. Not drinking beer anymore. Stop smoking in January 2019 and using Vapor.  : Has strong family history of heart attack.   : EF 65-70%, TEE 02/04/17: Bicuspid aortic valve with focal sclerosis.  ? History of neck pain. Seen neurosurgery in the past per note.  ? Chronic cough : currently being addressed by PCP : 25 year pack smoking history.  ? Headache. Head CT unremarkable 11/28/13.  ? Rt shoulder pain, chronic  ? 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    H/o Diverticulitis  ? Admitted 02/01/17 - 02/04/17, treated    Left upper pole renal mass  ? Incidental findings when he was admitted with Diverticulitis in January 2019.  ? 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   ? Scheduled for partial nephrectomy in March 209.      Plan:  - labs today HIV VL, CD4, RPR, CMP  - Continue Triumeq  - Follow-up in 3 months     Total Time Today was 20 minutes in the following activities: Performing a medically appropriate examination and/or evaluation, discusing with patient, reviewing labs, HIV genotype, vaccines, documenting.

## 2021-11-04 NOTE — Progress Notes
Pharmacy Medication Re-assessment    Summary of Visit    [name] continues on Triumeq w/ excellent reported adherence.    No new medications to report.  He is taking all of the medications below.  Not taking b12 or any other vitamins.    Indication/Regimen  The regimen of TRIUMEQ 600-50-300 MG PO TAB indefinitely is appropriate for Charles Walls who has HIV (human immunodeficiency virus infection) (HCC).    Renal dose adjustments are not required. Hepatic dose adjustments are not required. Dose titration is not required.    The patient has the ability to self-administer the medication(s).    Regimen Assessment  The patient is on a single tablet regimen.    The patient's regimen was not changed.    Therapeutic Goals and Monitoring  Patient is HIV positive. HIV genotype has been reviewed and treatment with select therapy is appropriate. The goal of therapy is to decrease viral load and increase CD4 count.    Patient has not completed labs. Labs are due. Laboratory studies will be collected today to determined HIV virologic and immunologic response.    The patient is making progress toward achieving their therapeutic goal. The plan is to continue current therapy.    Past Medical History and Comorbidities  Patient Active Problem List   Diagnosis   ? HIV (human immunodeficiency virus infection) (HCC)   ? Hyperlipidemia   ? Tobacco abuse   ? Renal cell cancer, left (HCC)   ? Calcification of aortic valve   ? Renal cell carcinoma of left kidney (HCC)   ? Lymphadenopathy, inguinal   ? Methamphetamine use disorder, mild (HCC)   ? Other stimulant dependence, uncomplicated (HCC)   ? Other primary thrombophilia (HCC)   ? Oral herpes simplex infection   ? 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 Lymphadenopathy   ? Spondylosis of cervical joint   ? Reactive lymphoid hyperplasia   ? Immunodeficiency, unspecified (HCC)   ? Pulmonary emphysema, unspecified emphysema type (HCC)   ? History of syphilis   ? Perirectal abscess     Additional comorbidities: yes  Vitamin D deficiency, erectile dysfunction     Labs and Diagnostic Tests  CD4 Count   Date/Time Value Ref Range Status   02/06/2021 02:51 AM 724 440 - 2,160 Final          HIV-1, copies/mL   Date/Time Value Ref Range Status   06/05/2021 04:31 AM 4,980 (A) HIVND [copies]/mL Final     Comment:     The test method detects HIV-1 viral load using the Abbott assay. Please   correlate results with the clinical status of the patient.          HIV Viral Load: The patients viral load is suppressed.          HBV-related labs:       HBsAg   Date/Time Value Ref Range Status   08/27/2020 11:52 AM NONREACTIVE NR-NONREACTIVE Final     Comment:     HBs antigen not detected.          Anti HBc Total   Date Value Ref Range Status   08/27/2020 REACTIVE (A) NR-NONREACTIVE Final     Comment:     Presumptive evidence of antibodies to HBV core antigen (anti-HBC), indicating   acute, chronic, past, or resolved hepatitis B infection.            HCV-related labs:       Anti HCV  Date Value Ref Range Status   02/06/2021 NONREACTIVE NR-NONREACTIVE Final     Comment:     Antibodies to HCV were not detected.          HLA-B*[id number] status: negative     Tropism testing: not applicable       Allergies  Allergies   Allergen Reactions   ? Sulfa (Sulfonamide Antibiotics) SEE COMMENTS     Patient reports that his father and brother are allergic, he is unsure so he reports it as an allergy.         Immunizations  Vaccine history was reviewed with the patient. Education was provided on the importance of completing vaccines, including annual influenza vaccine, COVID-19 vaccine and hepatitis B vaccine if not immune.    Immunization History   Administered Date(s) Administered   ? COVID-19 (PFIZER), mRNA vacc, 30 mcg/0.3 mL (PF) 05/07/2019, 05/28/2019   ? Flu Vaccine =>6 Months Quadrivalent PF 11/18/2020   ? Flu Vaccine =>65 YO High-Dose (PF) 02/11/2016, 11/24/2016   ? Flu Vaccine =>65 YO High-Dose Quadrivalent (PF) 02/07/2019   ? Flu Vaccine Trivalent =>3 Yo (Preservative Free) 10/04/2013   ? Hepatitis B Vaccine Adult 3 Dose IM 10/01/2013   ? Meningococcal Conjug Vaccine IM (MenACWY-CRM)(Menveo) 06/18/2015, 02/11/2016   ? Pneumococcal Vaccine (23-Val Adult) 10/01/2013   ? Pneumococcal Vaccine(13-Val Peds/immunocompromised adult) 07/31/2013   ? Smallpox and Monkeypox Vaccine Live PF ID 09/19/2020, 02/20/2021   ? Tdap Vaccine 05/11/2013, 08/09/2019       Home Medications    Medication Sig   acetaminophen (TYLENOL EXTRA STRENGTH) 500 mg tablet Take one tablet to two tablets by mouth every 6 hours as needed for Pain. Max of 4,000 mg of acetaminophen in 24 hours.   aspirin (ASPIRIN CHILDRENS) 81 mg chewable tablet Chew one tablet by mouth daily.   atorvastatin (LIPITOR) 10 mg tablet Take one tablet by mouth daily.   metoprolol succinate XL (TOPROL XL) 25 mg extended release tablet Take one tablet by mouth daily.   sildenafiL (VIAGRA) 50 mg tablet TAKE 1 TABLET BY MOUTH AS NEEDED FOR ERECTILE DYSFUNCTION   TRIUMEQ 600-50-300 mg tablet Take 1 Tablet by mouth once daily   valACYclovir (VALTREX) 1 gram tablet Take 2 Tablets by mouth every 12 (twelve) hours for 4 days for outbreak     Medication Reconciliation  Medication history and reconciliation were performed (including prescription medications, supplements, over the counter, and herbal products). The medication list was updated and the patient's current medication list is included. The patient was instructed to speak with their health care provider before starting any new drug, including prescription or over the counter, natural / herbal products, or vitamins.    Drug Interactions    Drug-Drug Interactions  Drug-drug interactions were evaluated. There were not clinically significant drug-drug interactions.     Drug-Food Interactions  Drug-food interactions were evaluated. There are not clinically significant drug-food interactions.    Triumeq  should be taken with or without food.    Adverse Drug Reactions  Adverse drug reactions were reviewed with the patient.    Significant adverse drug reaction(s) were not identified.    Side effect(s) were not reported.    Adherence  Refill and adherence history were reviewed with the patient. The patient was educated on the importance of adherence.    Patient is adherent with refills: yes  Patient is meeting refill adherence goal: yes    Patient reported 0 missed doses over the past 1 week.  Significance of missed  doses: NA - no missed doses   Patient is meeting reported adherence goal.    Safety Precautions    Risk Evaluation and Mitigation (REMS) Assessment: REMS is not required for this medication.    Safety precautions were addressed and discussed with the patient as applicable.    Contraindications: WENDY GUO does not have contraindications to this medication.      Toxoplasmosis screening, sexually transmitted infections testing, and comorbid conditions assessment:    Patient has been screened for tuberculosis, toxoplasmosis, STIs, and assessed for comorbid conditions as appropriate.    Depression screening:    Patient has been appropriately screened by healthcare team.    Substance abuse screening (tobacco, alcohol, illicit substances):    Patient was screened for substance abuse and counseling was provided.    Pregnancy Status: Male, education not applicable.    Medication Education  Counseling was not completed because patient was previously educated and did not require additional counseling.    KHYE DEVILLIERS was given the opportunity to ask questions but did not have any questions at the time. Patient was reminded of the refill process and encouraged to call with questions. The monitoring and follow-up plan was discussed with the patient. The patient was instructed to contact their health care provider if their symptoms or health problems do not get better or if they become worse. The patient should contact the specialty pharmacy at [phone] if they have any questions or concerns regarding their medication therapy. The patient verbalized acceptance and understanding.    Follow-up Plan  The patient will be reassessed within 1 year.      Insurance:    Juanell Fairly Program involvement: Charissa Bash at [place] is his new case mgr    The medication(s) will be received from an outside pharmacy [facility] ) based on patient preference due to Prefers to fill his medication at [facility].    Janan Ridge, PharmD, BCPS, AAHIVP  [phone]

## 2021-11-17 ENCOUNTER — Encounter: Admit: 2021-11-17 | Discharge: 2021-11-17 | Payer: No Typology Code available for payment source

## 2021-11-27 ENCOUNTER — Encounter: Admit: 2021-11-27 | Discharge: 2021-11-27 | Payer: No Typology Code available for payment source

## 2021-11-27 MED ORDER — SILDENAFIL 50 MG PO TAB
ORAL_TABLET | 2 refills
Start: 2021-11-27 — End: ?

## 2021-12-18 ENCOUNTER — Encounter: Admit: 2021-12-18 | Discharge: 2021-12-18 | Payer: No Typology Code available for payment source

## 2021-12-23 ENCOUNTER — Encounter: Admit: 2021-12-23 | Discharge: 2021-12-23 | Payer: No Typology Code available for payment source

## 2021-12-23 NOTE — Telephone Encounter
For almost a week, sneezing, coughing and burns in chest to breathe, he sounds miserable, taking tylenol cold and flu. No fever, aches all over. Able to lie down but was sob last night, used albuterol MDI 1-2 times in past week "but it made me cough" I gave him UPPER RESPIRATORY INFECTION protocol- check for Covid, go to [facility] for eval with a mask

## 2021-12-25 ENCOUNTER — Encounter: Admit: 2021-12-25 | Discharge: 2021-12-25 | Payer: No Typology Code available for payment source

## 2022-01-05 ENCOUNTER — Encounter: Admit: 2022-01-05 | Discharge: 2022-01-05 | Payer: No Typology Code available for payment source

## 2022-01-05 MED ORDER — VALACYCLOVIR 1 GRAM PO TAB
ORAL_TABLET | 0 refills | Status: AC
Start: 2022-01-05 — End: ?

## 2022-01-05 NOTE — Telephone Encounter
Charles Walls calls in states that he has red splotches everywhere. States there is one on his eye and there is a lump under his eye lid. States his voice is now starting to be hoarse. Explained that if his voice is starting to be hoarse needs to be evaluated in the emergency department as he could be having a reaction to something. He voiced understanding.

## 2022-01-18 NOTE — Progress Notes
Charles Walls is a 52 y.o. male.    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 On [facility].     Hyperlipidemia Management: Takes atorvastatin 10 mg daily.   LDL goal < 100.   Diet adherence:most of the time   Medication adherence:all of the time   Side effects to medications? No  Lab Results   Component Value Date/Time    CHOL 120 07/09/2021 04:04 PM    TRIG 139 07/09/2021 04:04 PM    HDL 39 (L) 07/09/2021 04:04 PM    LDL 74 07/09/2021 04:04 PM    VLDL 28 07/09/2021 04:04 PM    NONHDLCHOL 81 07/09/2021 04:04 PM    ALT 12 11/04/2021 10:52 AM    VITD25 26.4 (L) 03/21/2019 03:20 PM        Imp: Hyperlipidemia good     Plan:  Discussed labs and reviewed goals for LDL, HDL, triglycerides.   Discussed exercise management and diet with emphasis on vegetables, fruit and lean meat.  Are barriers to achieving goals present? No  Medication education provided. Patient voiced understanding? Yes  Patient able to self-manage and ready to comply?Yes  Educational resources identified? Verbal Counseling       Review of Systems   Constitutional:  Negative for chills, fatigue, fever and unexpected weight change.   Eyes:  Negative for visual disturbance.   Respiratory:  Negative for cough, chest tightness, shortness of breath and wheezing.    Cardiovascular:  Negative for chest pain and palpitations.   Neurological:  Negative for dizziness, syncope, light-headedness, numbness and headaches. Psychiatric/Behavioral:  Negative for dysphoric mood and suicidal ideas. The patient is not nervous/anxious.    All other systems reviewed and are negative.          Your Current Medications:         Instructions    acetaminophen (TYLENOL EXTRA STRENGTH) 500 mg tablet Take one tablet to two tablets by mouth every 6 hours as needed for Pain. Max of 4,000 mg of acetaminophen in 24 hours.    aspirin (ASPIRIN CHILDRENS) 81 mg chewable tablet Chew one tablet by mouth daily.    atorvastatin (LIPITOR) 10 mg tablet Take one tablet by mouth daily.    metoprolol succinate XL (TOPROL XL) 25 mg extended release tablet Take one tablet by mouth daily.    sildenafiL (VIAGRA) 50 mg tablet TAKE 1 TABLET BY MOUTH AS NEEDED FOR ERECTILE DYSFUNCTION    TRIUMEQ 600-50-300 mg tablet Take 1 Tablet by mouth once daily    valACYclovir (VALTREX) 1 gram tablet Take 2 Tablets by mouth twice daily (every 12 hours) for 4 days for outbreak  Medications Discontinued During This Encounter   Medication Reason    atorvastatin (LIPITOR) 10 mg tablet Reorder         Objective:          Vitals:    01/19/22 1100   BP: 124/82   BP Source: Arm, Left Upper   Pulse: 102   Temp: 36.2 ?C (97.2 ?F)   SpO2: 97%   TempSrc: Temporal   PainSc: Five   Weight: 65.8 kg (145 lb)   Height: 162.6 cm (5' 4)     Body mass index is 24.89 kg/m?Marland Kitchen   No LMP for male patient.      Physical Exam  Vitals and nursing note reviewed.   Constitutional:       Appearance: Normal appearance.   HENT:      Head: Normocephalic and atraumatic.      Right Ear: External ear normal.      Left Ear: External ear normal.      Nose: Nose normal. No congestion or rhinorrhea.   Eyes:      Extraocular Movements: Extraocular movements intact.   Cardiovascular:      Rate and Rhythm: Normal rate and regular rhythm.      Heart sounds: Normal heart sounds.   Pulmonary:      Effort: Pulmonary effort is normal.      Breath sounds: Normal breath sounds.   Musculoskeletal:         General: Normal range of motion.      Cervical back: Normal range of motion and neck supple.   Skin:     General: Skin is warm and dry.      Findings: No rash (on visible skin).   Neurological:      General: No focal deficit present.      Mental Status: He is alert and oriented to person, place, and time.   Psychiatric:         Mood and Affect: Mood normal.         Behavior: Behavior normal.         Thought Content: Thought content normal.         Judgment: Judgment normal.         No results found for this or any previous visit (from the past 336 hour(s)).          Assessment and Plan:           Charles Walls was seen today for follow up, imm/inj and labs only.    Diagnoses and all orders for this visit:    Mixed hyperlipidemia  -     LIPID PROFILE; Future; Expected date: 01/19/2022  -     LIVER FUNCTION PANEL; Future; Expected date: 01/19/2022  -     atorvastatin (LIPITOR) 10 mg tablet; Take one tablet by mouth daily.  Aortic calcification (HCC)  -     atorvastatin (LIPITOR) 10 mg tablet; Take one tablet by mouth daily.  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  Chronic. Takes atorvastatin 10 mg daily.  RTC for fasting labs.    HIV infection, unspecified symptom status (HCC)  Immunodeficiency, unspecified (HCC)  Chronic. He will continue to follow closely with  ID.    Lymphoma of lymph nodes of neck, unspecified lymphoma type (HCC)  -     ELECTROPHORESIS-SERUM PROTEIN; Future; Expected date: 01/19/2022  -  IMMUNOFIXATION, SERUM (IFES); Future; Expected date: 01/19/2022  -     KAPPA/LAMBDA FREE LIGHT CHAINS; Future; Expected date: 01/19/2022  Chronic. Seen by [facility] last year. Advised not treatment indicated but to continue to monitor SPEP, immunofixation, and free light chain annually.     MDD (major depressive disorder), recurrent , severe, in full remission(HCC)  Thought to be secondary to substance abuse. No longer on medication and mood has been stable.     Pulmonary emphysema, unspecified emphysema type (HCC)  Chronic. Asymptomatic. Noted on CT Lung Cancer Screenings.     Other stimulant dependence, uncomplicated (HCC)  Chronic. Sober since January 2023.    Other primary thrombophilia (HCC)  Chronic. Continue to trend labs.     Other orders  -     PNEUMOCOCCAL VACCINE 20-VAL      I reviewed with the patient their current medications and specifically any new medications prescribed at the time of this visit and we reviewed the expected benefits and potential side effects. All questions are answered to the patient's satisfaction.    Patient education provided regarding diagnosis, course, and treatment. Patient was in agreement to instructions, POC, and will call if questions or concerns arise.     Total Time Today was 30 minutes in the following activities: Preparing to see the patient, Obtaining and/or reviewing separately obtained history, Performing a medically appropriate examination and/or evaluation, Counseling and educating the patient/family/caregiver, Ordering medications, tests, or procedures, Documenting clinical information in the electronic or other health record, and Independently interpreting results (not separately reported) and communicating results to the patient/family/caregiver        Return in about 6 months (around 07/20/2022) for medicare wellness physical.      Future Appointments   Date Time Provider Department Center   04/14/2022 11:30 AM El Drema Balzarine I, MD [facility]

## 2022-01-19 ENCOUNTER — Encounter: Admit: 2022-01-19 | Discharge: 2022-01-19 | Payer: No Typology Code available for payment source

## 2022-01-19 ENCOUNTER — Ambulatory Visit: Admit: 2022-01-19 | Discharge: 2022-01-19 | Payer: No Typology Code available for payment source

## 2022-01-19 DIAGNOSIS — K219 Gastro-esophageal reflux disease without esophagitis: Secondary | ICD-10-CM

## 2022-01-19 DIAGNOSIS — L02414 Cutaneous abscess of left upper limb: Secondary | ICD-10-CM

## 2022-01-19 DIAGNOSIS — A419 Sepsis, unspecified organism: Secondary | ICD-10-CM

## 2022-01-19 DIAGNOSIS — D6859 Other primary thrombophilia: Secondary | ICD-10-CM

## 2022-01-19 DIAGNOSIS — D849 Immunodeficiency, unspecified: Secondary | ICD-10-CM

## 2022-01-19 DIAGNOSIS — K5792 Diverticulitis of intestine, part unspecified, without perforation or abscess without bleeding: Secondary | ICD-10-CM

## 2022-01-19 DIAGNOSIS — E782 Mixed hyperlipidemia: Secondary | ICD-10-CM

## 2022-01-19 DIAGNOSIS — I359 Nonrheumatic aortic valve disorder, unspecified: Secondary | ICD-10-CM

## 2022-01-19 DIAGNOSIS — L409 Psoriasis, unspecified: Secondary | ICD-10-CM

## 2022-01-19 DIAGNOSIS — J439 Emphysema, unspecified: Secondary | ICD-10-CM

## 2022-01-19 DIAGNOSIS — L03317 Cellulitis of buttock: Secondary | ICD-10-CM

## 2022-01-19 DIAGNOSIS — B20 Human immunodeficiency virus [HIV] disease: Secondary | ICD-10-CM

## 2022-01-19 DIAGNOSIS — A64 Unspecified sexually transmitted disease: Secondary | ICD-10-CM

## 2022-01-19 DIAGNOSIS — C8591 Non-Hodgkin lymphoma, unspecified, lymph nodes of head, face, and neck: Secondary | ICD-10-CM

## 2022-01-19 DIAGNOSIS — E785 Hyperlipidemia, unspecified: Secondary | ICD-10-CM

## 2022-01-19 DIAGNOSIS — R42 Dizziness and giddiness: Secondary | ICD-10-CM

## 2022-01-19 DIAGNOSIS — F1520 Other stimulant dependence, uncomplicated: Secondary | ICD-10-CM

## 2022-01-19 DIAGNOSIS — F32A Depression: Secondary | ICD-10-CM

## 2022-01-19 DIAGNOSIS — L0390 Cellulitis, unspecified: Secondary | ICD-10-CM

## 2022-01-19 DIAGNOSIS — R000 Tachycardia, unspecified: Secondary | ICD-10-CM

## 2022-01-19 DIAGNOSIS — T1491XA Suicide attempt, initial encounter: Secondary | ICD-10-CM

## 2022-01-19 DIAGNOSIS — L03116 Cellulitis of left lower limb: Secondary | ICD-10-CM

## 2022-01-19 DIAGNOSIS — F3342 Major depressive disorder, recurrent, in full remission: Secondary | ICD-10-CM

## 2022-01-19 DIAGNOSIS — I38 Endocarditis, valve unspecified: Secondary | ICD-10-CM

## 2022-01-19 DIAGNOSIS — I700 Atherosclerosis of aorta: Secondary | ICD-10-CM

## 2022-01-19 DIAGNOSIS — S129XXA Fracture of neck, unspecified, initial encounter: Secondary | ICD-10-CM

## 2022-01-19 DIAGNOSIS — R931 Abnormal findings on diagnostic imaging of heart and coronary circulation: Secondary | ICD-10-CM

## 2022-01-19 MED ORDER — ATORVASTATIN 10 MG PO TAB
10 mg | ORAL_TABLET | Freq: Every day | ORAL | 1 refills | Status: AC
Start: 2022-01-19 — End: ?

## 2022-01-19 NOTE — Patient Instructions
It was a pleasure to see you today, Charles Walls.     We will be in touch with your lab results. You can get them done at [facility] remember to fast!

## 2022-01-28 ENCOUNTER — Encounter: Admit: 2022-01-28 | Discharge: 2022-01-28 | Payer: No Typology Code available for payment source

## 2022-01-28 NOTE — Telephone Encounter
Carmon states that  he has an ulcer on the back of his tongue and it :"feels like I am swallowing glass". He has been taking anbesol and OTC pain relievers. Wants to be seen for the pain. Scheduled with Dr. Jacqlyn Krauss tomorrow at 9:30.

## 2022-01-29 ENCOUNTER — Encounter: Admit: 2022-01-29 | Discharge: 2022-01-29 | Payer: No Typology Code available for payment source

## 2022-01-29 ENCOUNTER — Ambulatory Visit: Admit: 2022-01-29 | Discharge: 2022-01-30 | Payer: No Typology Code available for payment source

## 2022-01-29 ENCOUNTER — Ambulatory Visit: Admit: 2022-01-29 | Discharge: 2022-01-29 | Payer: No Typology Code available for payment source

## 2022-01-29 DIAGNOSIS — L03116 Cellulitis of left lower limb: Secondary | ICD-10-CM

## 2022-01-29 DIAGNOSIS — K219 Gastro-esophageal reflux disease without esophagitis: Secondary | ICD-10-CM

## 2022-01-29 DIAGNOSIS — R000 Tachycardia, unspecified: Secondary | ICD-10-CM

## 2022-01-29 DIAGNOSIS — K5792 Diverticulitis of intestine, part unspecified, without perforation or abscess without bleeding: Secondary | ICD-10-CM

## 2022-01-29 DIAGNOSIS — R42 Dizziness and giddiness: Secondary | ICD-10-CM

## 2022-01-29 DIAGNOSIS — S129XXA Fracture of neck, unspecified, initial encounter: Secondary | ICD-10-CM

## 2022-01-29 DIAGNOSIS — A64 Unspecified sexually transmitted disease: Secondary | ICD-10-CM

## 2022-01-29 DIAGNOSIS — L03317 Cellulitis of buttock: Secondary | ICD-10-CM

## 2022-01-29 DIAGNOSIS — L02414 Cutaneous abscess of left upper limb: Secondary | ICD-10-CM

## 2022-01-29 DIAGNOSIS — T1491XA Suicide attempt, initial encounter: Secondary | ICD-10-CM

## 2022-01-29 DIAGNOSIS — L0390 Cellulitis, unspecified: Secondary | ICD-10-CM

## 2022-01-29 DIAGNOSIS — E785 Hyperlipidemia, unspecified: Secondary | ICD-10-CM

## 2022-01-29 DIAGNOSIS — F32A Depression: Secondary | ICD-10-CM

## 2022-01-29 DIAGNOSIS — I38 Endocarditis, valve unspecified: Secondary | ICD-10-CM

## 2022-01-29 DIAGNOSIS — L409 Psoriasis, unspecified: Secondary | ICD-10-CM

## 2022-01-29 DIAGNOSIS — A419 Sepsis, unspecified organism: Secondary | ICD-10-CM

## 2022-01-29 DIAGNOSIS — B20 Human immunodeficiency virus [HIV] disease: Secondary | ICD-10-CM

## 2022-01-29 MED ORDER — AMOXICILLIN-POT CLAVULANATE 875-125 MG PO TAB
1 | ORAL_TABLET | Freq: Two times a day (BID) | ORAL | 0 refills | 7.00000 days | Status: AC
Start: 2022-01-29 — End: ?

## 2022-01-29 MED ORDER — VALACYCLOVIR 1 GRAM PO TAB
2000 mg | ORAL_TABLET | Freq: Two times a day (BID) | ORAL | 1 refills | Status: AC
Start: 2022-01-29 — End: ?

## 2022-01-29 MED ORDER — LIDOCAINE HCL 2 % MM SOLN
5 mL | Freq: Four times a day (QID) | ORAL | 0 refills | 30.00000 days | Status: AC | PRN
Start: 2022-01-29 — End: ?

## 2022-01-29 NOTE — Telephone Encounter
Charles Walls called to see if the results of his chlamydia/gonorrhea culture was back with results.  Informed Ranjit at this time his labs are currently being processed.  Antibiotics and lidocaine have been sent over to his pharmacy.  The office will re open on Tuesday at [place] to follow up on Tuesday.  Jazier verbalized understanding.

## 2022-01-29 NOTE — Progress Notes
Date of Service: 01/29/2022    Charles Walls is a 52 y.o. male.  DOB: 03-08-1970  MRN: [id number]   Chief Complaint   Patient presents with    Mouth Lesions     Per note: Sore on back of tongue, feels like swallowing glass  First noticed 4 days ago, was just seen in office  Pain today 7/10, worst in the morning       Subjective:             History of Present Illness    Charles Walls is a 52 year old gentleman who presents to clinic for a sore throat. He says his symptoms started suddenly 4 days ago. He was not having any pain when he was recently seen on January 2nd. He says he has noticed an ulcer on the side of his tongue. He says he also has pain in the back of his throat. He says it feels like a burning sensation in his mouth. He reports his voice is more hoarse as well. He is having pain with swallowing. He has not had any recent sick contacts and has not been around any young children since Christmas. He denies any fevers or chills. He is sexually active in a monogamous relationship.         Review of Systems   Constitutional:  Negative for fatigue and fever.   HENT:  Positive for mouth sores, sore throat, trouble swallowing and voice change. Negative for congestion.    Respiratory:  Negative for cough and shortness of breath.    Cardiovascular:  Negative for chest pain and palpitations.         Objective:          acetaminophen (TYLENOL EXTRA STRENGTH) 500 mg tablet Take one tablet to two tablets by mouth every 6 hours as needed for Pain. Max of 4,000 mg of acetaminophen in 24 hours.    aspirin (ASPIRIN CHILDRENS) 81 mg chewable tablet Chew one tablet by mouth daily.    atorvastatin (LIPITOR) 10 mg tablet Take one tablet by mouth daily.    lidocaine hcl viscous (LIDOCAINE VISCOUS) 2 % solution Swish and Spit 5 mL by mouth as directed four times daily as needed.    metoprolol succinate XL (TOPROL XL) 25 mg extended release tablet Take one tablet by mouth daily.    sildenafiL (VIAGRA) 50 mg tablet TAKE 1 TABLET BY MOUTH AS NEEDED FOR ERECTILE DYSFUNCTION    TRIUMEQ 600-50-300 mg tablet Take 1 Tablet by mouth once daily    valACYclovir (VALTREX) 1 gram tablet Take two tablets by mouth every 12 hours for 48 days.     Vitals:    01/29/22 0935   BP: 110/70   BP Source: Arm, Right Upper   Pulse: 56   Temp: 36.5 ?C (97.7 ?F)   SpO2: 100%   TempSrc: Temporal   PainSc: Seven   Weight: 64.4 kg (141 lb 14.4 oz)   Height: 162.6 cm (5' 4)     Body mass index is 24.36 kg/m?Marland Kitchen     Physical Exam  Constitutional:       Appearance: Normal appearance.   HENT:      Head: Normocephalic and atraumatic.      Mouth/Throat:      Tongue: Lesions (left sided, slit-like, no exudate) present.      Pharynx: Posterior oropharyngeal erythema present.      Tonsils: No tonsillar exudate.   Eyes:      General: No scleral  icterus.     Conjunctiva/sclera: Conjunctivae normal.   Cardiovascular:      Rate and Rhythm: Normal rate and regular rhythm.      Pulses: Normal pulses.   Pulmonary:      Effort: Pulmonary effort is normal. No respiratory distress.      Breath sounds: Normal breath sounds. No wheezing.   Lymphadenopathy:      Cervical: No cervical adenopathy.   Skin:     General: Skin is warm and dry.   Neurological:      Mental Status: He is alert.      Gait: Gait normal.   Psychiatric:         Mood and Affect: Mood normal.         Behavior: Behavior normal.              Assessment and Plan:  Charles Walls was seen today for mouth lesions.    Diagnoses and all orders for this visit:    Sore throat  -     CHLAM/NG PCR SWAB; Future; Expected date: 01/29/2022  -     lidocaine hcl viscous (LIDOCAINE VISCOUS) 2 % solution; Swish and Spit 5 mL by mouth as directed four times daily as needed.  Gonorrhea/Chlamydia swab collected today. Order placed for viscous lidocaine. Will call patient back with results. If symptoms do not improve over the next week, will refer to ENT.    Other orders  -     valACYclovir (VALTREX) 1 gram tablet; Take two tablets by mouth every 12 hours for 48 days.

## 2022-01-30 ENCOUNTER — Encounter: Admit: 2022-01-30 | Discharge: 2022-01-30 | Payer: No Typology Code available for payment source

## 2022-01-30 DIAGNOSIS — J029 Acute pharyngitis, unspecified: Secondary | ICD-10-CM

## 2022-01-30 MED ORDER — DOXYCYCLINE HYCLATE 100 MG PO TAB
100 mg | ORAL_TABLET | Freq: Two times a day (BID) | ORAL | 0 refills | 8.00000 days | Status: AC
Start: 2022-01-30 — End: ?

## 2022-02-02 ENCOUNTER — Encounter: Admit: 2022-02-02 | Discharge: 2022-02-02 | Payer: No Typology Code available for payment source

## 2022-02-02 MED ORDER — CEFTRIAXONE/LIDOCAINE IM 1G VIAL MIXTURE
1 g | Freq: Once | INTRAMUSCULAR | 0 refills | Status: AC
Start: 2022-02-02 — End: ?

## 2022-02-02 NOTE — Telephone Encounter
Called to check on Charles Walls and see if he is feeling better in his throat.  He stated he is much better.  Dr Jacqlyn Krauss called pt over the weekend to give him his results.  Changed his antibiotics. Also, [name] to come into office today for Rocephin 1g injection.  [name] will be in sometime to day. Informed him we are closed from [id number] for luch. [name] verbalized understanding.

## 2022-02-03 ENCOUNTER — Encounter: Admit: 2022-02-03 | Discharge: 2022-02-03 | Payer: No Typology Code available for payment source

## 2022-02-03 ENCOUNTER — Ambulatory Visit: Admit: 2022-02-03 | Discharge: 2022-02-03 | Payer: No Typology Code available for payment source

## 2022-02-03 DIAGNOSIS — A549 Gonococcal infection, unspecified: Secondary | ICD-10-CM

## 2022-02-03 MED ORDER — CEFTRIAXONE/LIDOCAINE IM 1G VIAL MIXTURE
1 g | Freq: Once | INTRAMUSCULAR | 0 refills | Status: CP
Start: 2022-02-03 — End: ?
  Administered 2022-02-03 (×2): 1 g via INTRAMUSCULAR

## 2022-02-03 MED ORDER — VALACYCLOVIR 1 GRAM PO TAB
ORAL_TABLET | 0 refills
Start: 2022-02-03 — End: ?

## 2022-02-04 ENCOUNTER — Encounter: Admit: 2022-02-04 | Discharge: 2022-02-04 | Payer: No Typology Code available for payment source

## 2022-02-04 MED ORDER — VALACYCLOVIR 1 GRAM PO TAB
2000 mg | ORAL_TABLET | Freq: Two times a day (BID) | ORAL | 0 refills | Status: AC
Start: 2022-02-04 — End: ?

## 2022-02-04 MED ORDER — VALACYCLOVIR 1 GRAM PO TAB
2000 mg | ORAL_TABLET | Freq: Two times a day (BID) | ORAL | 1 refills | Status: CN
Start: 2022-02-04 — End: ?

## 2022-02-04 NOTE — Telephone Encounter
Saralyn Pilar asked valcyclovir to be sent to [facility] instead of [facility]. Rx resent to [facility] per Dr. Virgina Organ order.

## 2022-02-22 ENCOUNTER — Encounter: Admit: 2022-02-22 | Discharge: 2022-02-22 | Payer: No Typology Code available for payment source

## 2022-02-22 MED ORDER — SILDENAFIL 50 MG PO TAB
ORAL_TABLET | ORAL | 2 refills | 45.00000 days | Status: AC
Start: 2022-02-22 — End: ?

## 2022-02-22 MED ORDER — VALACYCLOVIR 1 GRAM PO TAB
2000 mg | ORAL_TABLET | Freq: Two times a day (BID) | ORAL | 0 refills
Start: 2022-02-22 — End: ?

## 2022-04-06 ENCOUNTER — Encounter: Admit: 2022-04-06 | Discharge: 2022-04-06 | Payer: No Typology Code available for payment source

## 2022-04-06 ENCOUNTER — Ambulatory Visit: Admit: 2022-04-06 | Discharge: 2022-04-06 | Payer: No Typology Code available for payment source

## 2022-04-06 DIAGNOSIS — A64 Unspecified sexually transmitted disease: Secondary | ICD-10-CM

## 2022-04-06 DIAGNOSIS — L03317 Cellulitis of buttock: Secondary | ICD-10-CM

## 2022-04-06 DIAGNOSIS — B20 Human immunodeficiency virus [HIV] disease: Secondary | ICD-10-CM

## 2022-04-06 DIAGNOSIS — L409 Psoriasis, unspecified: Secondary | ICD-10-CM

## 2022-04-06 DIAGNOSIS — E785 Hyperlipidemia, unspecified: Secondary | ICD-10-CM

## 2022-04-06 DIAGNOSIS — L0390 Cellulitis, unspecified: Secondary | ICD-10-CM

## 2022-04-06 DIAGNOSIS — K5792 Diverticulitis of intestine, part unspecified, without perforation or abscess without bleeding: Secondary | ICD-10-CM

## 2022-04-06 DIAGNOSIS — I38 Endocarditis, valve unspecified: Secondary | ICD-10-CM

## 2022-04-06 DIAGNOSIS — S129XXA Fracture of neck, unspecified, initial encounter: Secondary | ICD-10-CM

## 2022-04-06 DIAGNOSIS — A419 Sepsis, unspecified organism: Secondary | ICD-10-CM

## 2022-04-06 DIAGNOSIS — R42 Dizziness and giddiness: Secondary | ICD-10-CM

## 2022-04-06 DIAGNOSIS — R197 Diarrhea, unspecified: Secondary | ICD-10-CM

## 2022-04-06 DIAGNOSIS — R000 Tachycardia, unspecified: Secondary | ICD-10-CM

## 2022-04-06 DIAGNOSIS — L03116 Cellulitis of left lower limb: Secondary | ICD-10-CM

## 2022-04-06 DIAGNOSIS — K219 Gastro-esophageal reflux disease without esophagitis: Secondary | ICD-10-CM

## 2022-04-06 DIAGNOSIS — L02414 Cutaneous abscess of left upper limb: Secondary | ICD-10-CM

## 2022-04-06 DIAGNOSIS — T1491XA Suicide attempt, initial encounter: Secondary | ICD-10-CM

## 2022-04-06 DIAGNOSIS — F32A Depression: Secondary | ICD-10-CM

## 2022-04-06 MED ORDER — ONDANSETRON 4 MG PO TBDI
4 mg | ORAL_TABLET | ORAL | 0 refills | 8.00000 days | Status: AC | PRN
Start: 2022-04-06 — End: ?
  Filled 2022-04-07: qty 15, 5d supply, fill #1

## 2022-04-06 NOTE — Telephone Encounter
Charles Walls called and stated that he had vomiting and diarrhea that started on Saturday. He no longer has vomiting but continues to have moderate diarrhea. Loose and watery, cramping at the belly button( constant , worse when having diarrhea). He has been drinking fluids but does have dry mouth and occasional dizziness. Able to urinate. Has some discomfort in rectum as well. No openings in the office this afternoon. Instructed to go to an urgent care. Charles Walls is going to the [facility] [facility] urgent care at main campus.   Reason for Disposition   MODERATE diarrhea (e.g., 4-6 times / day more than normal) and present > 48 hours (2 days)    Answer Assessment - Initial Assessment Questions  1. DIARRHEA SEVERITY: How bad is the diarrhea? How many more stools have you had in the past 24 hours than normal?     - NO DIARRHEA (SCALE 0)    - MILD (SCALE 1-3): Few loose or mushy BMs; increase of 1-3 stools over normal daily number of stools; mild increase in ostomy output.    -  MODERATE (SCALE 4-7): Increase of 4-6 stools daily over normal; moderate increase in ostomy output.  * SEVERE (SCALE 8-10; OR 'WORST POSSIBLE'): Increase of 7 or more stools daily over normal; moderate increase in ostomy output; incontinence.      Moderate    2. ONSET: When did the diarrhea begin?       4 days ago    3. BM CONSISTENCY: How loose or watery is the diarrhea?       Loose and watery     4. VOMITING: Are you also vomiting? If Yes, ask: How many times in the past 24 hours?       No    5. ABDOMINAL PAIN: Are you having any abdominal pain? If Yes, ask: What does it feel like? (e.g., crampy, dull, intermittent, constant)       Cramping at belly button    6. ABDOMINAL PAIN SEVERITY: If present, ask: How bad is the pain?  (e.g., Scale 1-10; mild, moderate, or severe)    - MILD (1-3): doesn't interfere with normal activities, abdomen soft and not tender to touch     - MODERATE (4-7): interferes with normal activities or awakens from sleep, abdomen tender to touch     - SEVERE (8-10): excruciating pain, doubled over, unable to do any normal activities        Moderate     7. ORAL INTAKE: If vomiting, Have you been able to drink liquids? How much liquids have you had in the past 24 hours?      Yes hydrating    8. HYDRATION: Any signs of dehydration? (e.g., dry mouth [not just dry lips], too weak to stand, dizziness, new weight loss) When did you last urinate?      Dry mouth, dizziness, urinating     9. EXPOSURE: Have you traveled to a foreign country recently? Have you been exposed to anyone with diarrhea? Could you have eaten any food that was spoiled?      No    10. ANTIBIOTIC USE: Are you taking antibiotics now or have you taken antibiotics in the past 2 months?        No    11. OTHER SYMPTOMS: Do you have any other symptoms? (e.g., fever, blood in stool)        Discomfort in your rectum- dull , constant pain and worse when having bm , headache    12. PREGNANCY: Is  there any chance you are pregnant? When was your last menstrual period?        N/a    Protocols used: Diarrhea-A-OH

## 2022-04-06 NOTE — Telephone Encounter
Agree with nursing note.

## 2022-04-07 ENCOUNTER — Ambulatory Visit: Admit: 2022-04-07 | Discharge: 2022-04-06 | Payer: No Typology Code available for payment source

## 2022-04-07 NOTE — Patient Instructions
As we discussed in clinic the x-rays do not reveal any kind of blockages or fecal matter of any significant amount.  I recommend that if your pain gets more severe or you cannot tolerate it then to go to the ER for further testing such as a CT scan.  Take your Zofran as needed for nausea  Always discuss with your pharmacist any new medications that you take to make sure that they do not have any interactions with any current medications that you are taking.  If symptoms fail to improve, follow-up with your PCP  If symptoms worsen please get evaluated in the emergency room for further evaluation and treatment  If you have had any testing done you will only be notified of positive results.    All testing results will show in your My Chart portal if you have signed up for it.    Treating Diarrhea  Diarrhea happens when you have loose, watery, or frequent bowel movements. It's a common problem with many causes. Most cases of diarrhea clear up on their own. But certain cases may need treatment. Be sure to see your healthcare provider if your symptoms don't get better in a few days.   Getting relief  Treatment of diarrhea depends on its cause. Diarrhea caused by bacterial or parasite infection is often treated with antibiotics. Diarrhea caused by other factors, such as a stomach virus, often improves with simple home treatment. The tips below may also help ease your symptoms.     Drink plenty of fluids. This helps prevent too much fluid loss (dehydration). Water, clear soups, and electrolyte solutions are good choices. Don't have drinks with caffeine. Don't drink alcohol, coffee, tea, or milk. These can irritate your intestines and make symptoms worse.  Suck on ice chips first if drinking fluids makes you queasy.  Return to your normal diet slowly. You may want to eat bland foods at first, such as rice and toast. Also, you may need to stay away from certain foods for a while, such as dairy products. These can make symptoms worse. Ask your healthcare provider if there are any other foods you should stay away from.  If you were prescribed antibiotics, take them as directed. Finish the prescription even if you feel better.  Don't take anti-diarrhea medicines without asking your provider first.  Keep in mind that you may be infectious. Wash your hands often with soap and clean, running water or use an alcohol-based sanitizer that contains at least 60% alcohol.  Call your healthcare provider   Call your healthcare provider right away if you have any of the following:    A fever of 100.4?F ( 38.0?C) or higher, or as directed by your provider  Chills  Severe pain  Worsening diarrhea or diarrhea for more than 2 days  Bloody vomit or stool  Signs of dehydration (dizziness, dry mouth and tongue, rapid pulse, dark urine)  [employer] last reviewed this educational content on 05/18/2020  ? 2000-2023 [employer], [place]. All rights reserved. This information is not intended as a substitute for professional medical care. Always follow your healthcare professional's instructions.

## 2022-04-07 NOTE — Progress Notes
HPI:  Constipation (x1 week) and Headache     Patient arrives at the urgent care because he states last week he had been sick with fever and diarrhea and then it all went away for 3 days and now he had some diarrhea come back but has not had a solid bowel movement and is concerned about constipation.  Patient states that he only gets small amounts of diarrhea and is worried about a possible bowel blockage.  Patient states that his abdomen feels like it is covering up the whole so he nothing can get through.  Patient denies taking any medications that improve the symptoms.  Patient is 15, alert, answering appropriately, ambulating without difficulty, and does not appear to be in any acute distress      Review of Systems   Constitutional: Negative.    HENT: Negative.     Eyes: Negative.    Respiratory: Negative.     Cardiovascular: Negative.    Gastrointestinal:  Positive for abdominal pain and diarrhea.   Musculoskeletal: Negative.    Skin: Negative.    Neurological: Negative.         Physical Exam  Vitals reviewed.   Constitutional:       General: He is not in acute distress.     Appearance: Normal appearance. He is not ill-appearing.   HENT:      Head: Normocephalic and atraumatic.      Nose: Nose normal.   Eyes:      Extraocular Movements: Extraocular movements intact.   Cardiovascular:      Rate and Rhythm: Normal rate and regular rhythm.      Pulses: Normal pulses.      Heart sounds: Normal heart sounds.   Pulmonary:      Effort: Pulmonary effort is normal.      Breath sounds: Normal breath sounds.   Abdominal:      Tenderness: There is no abdominal tenderness.   Musculoskeletal:         General: Normal range of motion.      Cervical back: Normal range of motion.   Skin:     General: Skin is warm and dry.   Neurological:      General: No focal deficit present.      Mental Status: He is alert and oriented to person, place, and time.          Vitals:    04/06/22 1901   BP: 112/67   Pulse: 107   Temp: 36.7 ?C (98 ?F)   Resp: 18   SpO2: 97%          acetaminophen (TYLENOL EXTRA STRENGTH) 500 mg tablet Take one tablet to two tablets by mouth every 6 hours as needed for Pain. Max of 4,000 mg of acetaminophen in 24 hours.    aspirin (ASPIRIN CHILDRENS) 81 mg chewable tablet Chew one tablet by mouth daily.    atorvastatin (LIPITOR) 10 mg tablet Take one tablet by mouth daily.    lidocaine hcl viscous (LIDOCAINE VISCOUS) 2 % solution Swish and Spit 5 mL by mouth as directed four times daily as needed.    metoprolol succinate XL (TOPROL XL) 25 mg extended release tablet Take one tablet by mouth daily.    ondansetron (ZOFRAN ODT) 4 mg rapid dissolve tablet Dissolve one tablet by mouth every 8 hours as needed for up to 5 days. Place on tongue to dissolve.    sildenafiL (VIAGRA) 50 mg tablet TAKE 1 TABLET AS NEEDED FOR ED  TRIUMEQ 600-50-300 mg tablet Take 1 Tablet by mouth once daily    valACYclovir (VALTREX) 1 gram tablet Take two tablets by mouth twice daily.       Medical History:   Diagnosis Date    Abscess of skin of left wrist 02/05/2021    Cellulitis 05/31/2021    Cellulitis of buttock, left 05/31/2021    Cellulitis of left lower extremity 07/02/2020    Depression     Diverticulitis 02/02/2017    Dizziness Years  ago    Fracture cervical vertebra-closed (HCC)     c6, c7 - no surgical repair    Gastroesophageal reflux disease without esophagitis 10/05/2019    Ordered omeprazole 40mg  qday before breakfast.  Discussed lifestyle modifications. No current red flag symptoms.  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    Suicide attempt [facility])     Tachyarrhythmia June 2019    Valvular heart disease 2019        X-ray is void of any solid matter such as fecal matter or gas bubbles - radiology review and confirm findings      Assessment/Plan:  [name] was seen today for constipation and headache.    Diagnoses and all orders for this visit:    Diarrhea, unspecified type  -     ABDOMEN 1 VIEW; Future; Expected date: 04/06/2022    Other orders  -     ondansetron (ZOFRAN ODT) 4 mg rapid dissolve tablet; Dissolve one tablet by mouth every 8 hours as needed for up to 5 days. Place on tongue to dissolve.          Patient Instructions   As we discussed in clinic the x-rays do not reveal any kind of blockages or fecal matter of any significant amount.  I recommend that if your pain gets more severe or you cannot tolerate it then to go to the ER for further testing such as a CT scan.  Take your Zofran as needed for nausea  Always discuss with your pharmacist any new medications that you take to make sure that they do not have any interactions with any current medications that you are taking.  If symptoms fail to improve, follow-up with your PCP  If symptoms worsen please get evaluated in the emergency room for further evaluation and treatment  If you have had any testing done you will only be notified of positive results.    All testing results will show in your My Chart portal if you have signed up for it.    Treating Diarrhea  Diarrhea happens when you have loose, watery, or frequent bowel movements. It's a common problem with many causes. Most cases of diarrhea clear up on their own. But certain cases may need treatment. Be sure to see your healthcare provider if your symptoms don't get better in a few days.   Getting relief  Treatment of diarrhea depends on its cause. Diarrhea caused by bacterial or parasite infection is often treated with antibiotics. Diarrhea caused by other factors, such as a stomach virus, often improves with simple home treatment. The tips below may also help ease your symptoms.     Drink plenty of fluids. This helps prevent too much fluid loss (dehydration). Water, clear soups, and electrolyte solutions are good choices. Don't have drinks  with caffeine. Don't drink alcohol, coffee, tea, or milk. These can irritate your intestines and make symptoms worse.  Suck on ice chips first if drinking fluids makes you queasy.  Return to your normal diet slowly. You may want to eat bland foods at first, such as rice and toast. Also, you may need to stay away from certain foods for a while, such as dairy products. These can make symptoms worse. Ask your healthcare provider if there are any other foods you should stay away from.  If you were prescribed antibiotics, take them as directed. Finish the prescription even if you feel better.  Don't take anti-diarrhea medicines without asking your provider first.  Keep in mind that you may be infectious. Wash your hands often with soap and clean, running water or use an alcohol-based sanitizer that contains at least 60% alcohol.  Call your healthcare provider   Call your healthcare provider right away if you have any of the following:    A fever of 100.4?F ( 38.0?C) or higher, or as directed by your provider  Chills  Severe pain  Worsening diarrhea or diarrhea for more than 2 days  Bloody vomit or stool  Signs of dehydration (dizziness, dry mouth and tongue, rapid pulse, dark urine)  [employer] last reviewed this educational content on 05/18/2020  ? 2000-2023 [employer], [place]. All rights reserved. This information is not intended as a substitute for professional medical care. Always follow your healthcare professional's instructions.

## 2022-04-13 ENCOUNTER — Encounter: Admit: 2022-04-13 | Discharge: 2022-04-13 | Payer: No Typology Code available for payment source

## 2022-04-13 DIAGNOSIS — B20 Human immunodeficiency virus [HIV] disease: Secondary | ICD-10-CM

## 2022-04-13 DIAGNOSIS — R931 Abnormal findings on diagnostic imaging of heart and coronary circulation: Secondary | ICD-10-CM

## 2022-04-13 DIAGNOSIS — I359 Nonrheumatic aortic valve disorder, unspecified: Secondary | ICD-10-CM

## 2022-04-13 DIAGNOSIS — E782 Mixed hyperlipidemia: Secondary | ICD-10-CM

## 2022-04-13 MED ORDER — ATORVASTATIN 10 MG PO TAB
10 mg | ORAL_TABLET | Freq: Every day | ORAL | 0 refills | Status: AC
Start: 2022-04-13 — End: ?

## 2022-04-13 MED ORDER — METOPROLOL SUCCINATE 25 MG PO TB24
25 mg | ORAL_TABLET | Freq: Every day | ORAL | 1 refills | 90.00000 days | Status: AC
Start: 2022-04-13 — End: ?

## 2022-04-13 MED ORDER — TRIUMEQ 600-50-300 MG PO TAB
1 | ORAL_TABLET | Freq: Every day | ORAL | 1 refills | Status: AC
Start: 2022-04-13 — End: ?

## 2022-04-13 NOTE — Telephone Encounter
LOV: 11-04-21  NOV: 04-14-22  Labs last drawn on: 11-04-21  Viral load: <20  Routing to Dr.El Atrouni for approval.   Irma Newness, RN

## 2022-04-14 ENCOUNTER — Ambulatory Visit: Admit: 2022-04-14 | Discharge: 2022-04-15 | Payer: No Typology Code available for payment source

## 2022-04-14 ENCOUNTER — Encounter: Admit: 2022-04-14 | Discharge: 2022-04-14 | Payer: No Typology Code available for payment source

## 2022-04-14 DIAGNOSIS — L03317 Cellulitis of buttock: Secondary | ICD-10-CM

## 2022-04-14 DIAGNOSIS — R000 Tachycardia, unspecified: Secondary | ICD-10-CM

## 2022-04-14 DIAGNOSIS — A64 Unspecified sexually transmitted disease: Secondary | ICD-10-CM

## 2022-04-14 DIAGNOSIS — L02414 Cutaneous abscess of left upper limb: Secondary | ICD-10-CM

## 2022-04-14 DIAGNOSIS — T1491XA Suicide attempt, initial encounter: Secondary | ICD-10-CM

## 2022-04-14 DIAGNOSIS — L03116 Cellulitis of left lower limb: Secondary | ICD-10-CM

## 2022-04-14 DIAGNOSIS — L0390 Cellulitis, unspecified: Secondary | ICD-10-CM

## 2022-04-14 DIAGNOSIS — I38 Endocarditis, valve unspecified: Secondary | ICD-10-CM

## 2022-04-14 DIAGNOSIS — R42 Dizziness and giddiness: Secondary | ICD-10-CM

## 2022-04-14 DIAGNOSIS — K5792 Diverticulitis of intestine, part unspecified, without perforation or abscess without bleeding: Secondary | ICD-10-CM

## 2022-04-14 DIAGNOSIS — F32A Depression: Secondary | ICD-10-CM

## 2022-04-14 DIAGNOSIS — S129XXA Fracture of neck, unspecified, initial encounter: Secondary | ICD-10-CM

## 2022-04-14 DIAGNOSIS — L409 Psoriasis, unspecified: Secondary | ICD-10-CM

## 2022-04-14 DIAGNOSIS — B20 Human immunodeficiency virus [HIV] disease: Secondary | ICD-10-CM

## 2022-04-14 DIAGNOSIS — E785 Hyperlipidemia, unspecified: Secondary | ICD-10-CM

## 2022-04-14 DIAGNOSIS — K219 Gastro-esophageal reflux disease without esophagitis: Secondary | ICD-10-CM

## 2022-04-14 DIAGNOSIS — A419 Sepsis, unspecified organism: Secondary | ICD-10-CM

## 2022-04-14 LAB — CHLAM/NG PCR SWAB
CHLAMYDIA PROBE: NEGATIVE
GONORRHEA PROBE: NEGATIVE

## 2022-04-15 DIAGNOSIS — B20 Human immunodeficiency virus [HIV] disease: Secondary | ICD-10-CM

## 2022-05-07 ENCOUNTER — Encounter: Admit: 2022-05-07 | Discharge: 2022-05-07 | Payer: PRIVATE HEALTH INSURANCE

## 2022-05-07 ENCOUNTER — Emergency Department: Admit: 2022-05-07 | Discharge: 2022-05-07 | Payer: PRIVATE HEALTH INSURANCE

## 2022-05-07 DIAGNOSIS — M549 Dorsalgia, unspecified: Secondary | ICD-10-CM

## 2022-05-07 LAB — POC CREATININE, RAD: CREATININE, POC: 1.3 mg/dL — ABNORMAL HIGH (ref 0.4–1.24)

## 2022-05-07 LAB — COMPREHENSIVE METABOLIC PANEL
ALBUMIN: 4.2 g/dL — ABNORMAL HIGH (ref 3.5–5.0)
ALK PHOSPHATASE: 71 U/L — ABNORMAL LOW (ref 25–110)
ALT: 13 U/L (ref 7–56)
ANION GAP: 11 K/UL (ref 3–12)
AST: 19 U/L (ref 7–40)
BLD UREA NITROGEN: 26 mg/dL — ABNORMAL HIGH (ref 7–25)
CALCIUM: 9.4 mg/dL (ref 8.5–10.6)
CO2: 22 MMOL/L (ref 21–30)
CREATININE: 1.3 mg/dL — ABNORMAL HIGH (ref 0.4–1.24)
EGFR: >60 mL/min — ABNORMAL LOW (ref >60)
GLUCOSE,PANEL: 86 mg/dL (ref 70–100)
SODIUM: 136 MMOL/L — ABNORMAL LOW (ref 137–147)
TOTAL BILIRUBIN: 0.2 mg/dL — ABNORMAL LOW (ref 0.3–1.2)
TOTAL PROTEIN: 7.2 g/dL — ABNORMAL HIGH (ref 6.0–8.0)

## 2022-05-07 LAB — CBC AND DIFF
ABSOLUTE BASO COUNT: 0 K/UL (ref 0–0.20)
ABSOLUTE EOS COUNT: 0.1 K/UL (ref 0–0.45)
ABSOLUTE MONO COUNT: 0.8 K/UL (ref 0–0.80)
MDW (MONOCYTE DISTRIBUTION WIDTH): 23 — ABNORMAL HIGH (ref <20.7)
WBC COUNT: 7.7 K/UL (ref 4.5–11.0)

## 2022-05-07 LAB — HIGH SENSITIVITY TROPONIN I 0 HOUR: HIGH SENSITIVITY TROPONIN I 0 HOUR: 6 ng/L — ABNORMAL LOW (ref <20)

## 2022-05-07 LAB — HIGH SENSITIVITY TROPONIN I 2 HOUR: HIGH SENSITIVITY TROPONIN I 2 HOUR: 6 ng/L — ABNORMAL LOW (ref <20)

## 2022-05-07 MED ORDER — LACTATED RINGERS IV SOLP
1000 mL | INTRAVENOUS | 0 refills | Status: CP
Start: 2022-05-07 — End: ?
  Administered 2022-05-07: 16:00:00 1000 mL via INTRAVENOUS

## 2022-05-07 MED ORDER — KETOROLAC 15 MG/ML IJ SOLN
15 mg | Freq: Once | INTRAVENOUS | 0 refills | Status: CP
Start: 2022-05-07 — End: ?
  Administered 2022-05-07: 13:00:00 15 mg via INTRAVENOUS

## 2022-05-07 MED ORDER — MELOXICAM 7.5 MG PO TAB
7.5 mg | ORAL_TABLET | Freq: Every day | ORAL | 0 refills | 30.00000 days | Status: AC
Start: 2022-05-07 — End: ?

## 2022-05-07 MED ORDER — METHOCARBAMOL 750 MG PO TAB
750 mg | ORAL_TABLET | Freq: Four times a day (QID) | ORAL | 0 refills | Status: AC
Start: 2022-05-07 — End: ?

## 2022-05-07 MED ORDER — SODIUM CHLORIDE 0.9 % IJ SOLN
50 mL | Freq: Once | INTRAVENOUS | 0 refills | Status: CP
Start: 2022-05-07 — End: ?
  Administered 2022-05-07: 16:00:00 50 mL via INTRAVENOUS

## 2022-05-07 MED ORDER — ACETAMINOPHEN 500 MG PO TAB
1000 mg | Freq: Once | ORAL | 0 refills | Status: CP
Start: 2022-05-07 — End: ?
  Administered 2022-05-07: 16:00:00 1000 mg via ORAL

## 2022-05-07 MED ORDER — IOHEXOL 350 MG IODINE/ML IV SOLN
80 mL | Freq: Once | INTRAVENOUS | 0 refills | Status: CP
Start: 2022-05-07 — End: ?
  Administered 2022-05-07: 16:00:00 80 mL via INTRAVENOUS

## 2022-05-07 MED ORDER — LIDOCAINE 5 % TP PTMD
1 | Freq: Once | TOPICAL | 0 refills | Status: DC
Start: 2022-05-07 — End: 2022-05-07
  Administered 2022-05-07: 13:00:00 1 via TOPICAL

## 2022-05-07 MED ORDER — METHOCARBAMOL 750 MG PO TAB
750 mg | Freq: Once | ORAL | 0 refills | Status: CP
Start: 2022-05-07 — End: ?
  Administered 2022-05-07: 14:00:00 750 mg via ORAL

## 2022-05-07 MED ORDER — LACTATED RINGERS IV SOLP
1000 mL | INTRAVENOUS | 0 refills | Status: CP
Start: 2022-05-07 — End: ?
  Administered 2022-05-07: 14:00:00 1000 mL via INTRAVENOUS

## 2022-05-07 MED ORDER — PROCHLORPERAZINE EDISYLATE 5 MG/ML IJ SOLN
10 mg | Freq: Once | INTRAVENOUS | 0 refills | Status: CP
Start: 2022-05-07 — End: ?
  Administered 2022-05-07: 16:00:00 10 mg via INTRAVENOUS

## 2022-05-07 MED ORDER — LIDOCAINE 5 % TP PTMD
1 | MEDICATED_PATCH | Freq: Every day | TOPICAL | 0 refills | 30.00000 days | Status: AC
Start: 2022-05-07 — End: ?

## 2022-05-07 NOTE — Unmapped
You were seen in the emergency department for you concern of back pain. Based on our assessment, you have no acute or emergent needs for admission to the hospital at this time. We treated your symptoms with robaxin, toradol, fluids, tylenol, compazine, and you reported improvement. We are comfortable discharging you to home at this time.    Please return to the emergency department should you have any worsening of your symptoms, or any new concerning symptoms, such as chest pain or severe shortness of breath. Please also follow-up with the following providers:  No follow-up provider specified.    We have written you the following prescriptions. Please take them only as prescribed.  New Prescriptions    No medications on file       Thank you for choosing [facility].

## 2022-05-07 NOTE — ED Notes
Pt is a 52 y.o. male who presents to the ED for a CC of back pain. Pt has a past medical history of Abscess of skin of left wrist (02/05/2021), Cellulitis (05/31/2021), Cellulitis of buttock, left (05/31/2021), Cellulitis of left lower extremity (07/02/2020), Depression, Diverticulitis (02/02/2017), Dizziness (Years  ago), Fracture cervical vertebra-closed (HCC), Gastroesophageal reflux disease without esophagitis (10/05/2019), Human immunodeficiency virus (HIV) disease (HCC), Hyperlipidemia (06/13/2013), Psoriasis, unspecified (02/21/2014), Sepsis (HCC) (03/20/2019), Sexually transmitted disease (???), Suicide attempt [facility]), Tachyarrhythmia (June 2019), and Valvular heart disease (2019).     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 Pt denies any injury to the area. Pt states he has had a hx of spinal issues but has not had any pain or problems recently. Bilateral BPs stable and equal. EKG completed. Pt rating his pain a 10/10.   Pt AA&Ox4; VSS; NAD; Skin pwd.  Call light within reach, bed in lowest and locked position; oriented to surroundings  Await MD eval.

## 2022-05-10 ENCOUNTER — Encounter: Admit: 2022-05-10 | Discharge: 2022-05-10 | Payer: PRIVATE HEALTH INSURANCE

## 2022-05-10 MED ORDER — ASPIRIN 81 MG PO CHEW
ORAL_TABLET | 1 refills | Status: AC
Start: 2022-05-10 — End: ?

## 2022-05-13 ENCOUNTER — Encounter: Admit: 2022-05-13 | Discharge: 2022-05-13 | Payer: PRIVATE HEALTH INSURANCE

## 2022-05-13 MED ORDER — SILDENAFIL 50 MG PO TAB
ORAL_TABLET | 2 refills
Start: 2022-05-13 — End: ?

## 2022-06-23 ENCOUNTER — Encounter: Admit: 2022-06-23 | Discharge: 2022-06-23 | Payer: No Typology Code available for payment source

## 2022-07-14 ENCOUNTER — Ambulatory Visit: Admit: 2022-07-14 | Discharge: 2022-07-14 | Payer: No Typology Code available for payment source

## 2022-07-14 ENCOUNTER — Encounter: Admit: 2022-07-14 | Discharge: 2022-07-14 | Payer: No Typology Code available for payment source

## 2022-07-19 ENCOUNTER — Ambulatory Visit: Admit: 2022-07-19 | Discharge: 2022-07-19 | Payer: No Typology Code available for payment source

## 2022-07-19 ENCOUNTER — Encounter: Admit: 2022-07-19 | Discharge: 2022-07-19 | Payer: No Typology Code available for payment source

## 2022-07-19 DIAGNOSIS — I38 Endocarditis, valve unspecified: Secondary | ICD-10-CM

## 2022-07-19 DIAGNOSIS — L03116 Cellulitis of left lower limb: Secondary | ICD-10-CM

## 2022-07-19 DIAGNOSIS — K219 Gastro-esophageal reflux disease without esophagitis: Secondary | ICD-10-CM

## 2022-07-19 DIAGNOSIS — G5621 Lesion of ulnar nerve, right upper limb: Secondary | ICD-10-CM

## 2022-07-19 DIAGNOSIS — L409 Psoriasis, unspecified: Secondary | ICD-10-CM

## 2022-07-19 DIAGNOSIS — S129XXA Fracture of neck, unspecified, initial encounter: Secondary | ICD-10-CM

## 2022-07-19 DIAGNOSIS — F32A Depression: Secondary | ICD-10-CM

## 2022-07-19 DIAGNOSIS — R42 Dizziness and giddiness: Secondary | ICD-10-CM

## 2022-07-19 DIAGNOSIS — L03317 Cellulitis of buttock: Secondary | ICD-10-CM

## 2022-07-19 DIAGNOSIS — E785 Hyperlipidemia, unspecified: Secondary | ICD-10-CM

## 2022-07-19 DIAGNOSIS — A419 Sepsis, unspecified organism: Secondary | ICD-10-CM

## 2022-07-19 DIAGNOSIS — L02414 Cutaneous abscess of left upper limb: Secondary | ICD-10-CM

## 2022-07-19 DIAGNOSIS — R000 Tachycardia, unspecified: Secondary | ICD-10-CM

## 2022-07-19 DIAGNOSIS — K5792 Diverticulitis of intestine, part unspecified, without perforation or abscess without bleeding: Secondary | ICD-10-CM

## 2022-07-19 DIAGNOSIS — B20 Human immunodeficiency virus [HIV] disease: Secondary | ICD-10-CM

## 2022-07-19 DIAGNOSIS — A64 Unspecified sexually transmitted disease: Secondary | ICD-10-CM

## 2022-07-19 DIAGNOSIS — L0390 Cellulitis, unspecified: Secondary | ICD-10-CM

## 2022-07-19 DIAGNOSIS — M720 Palmar fascial fibromatosis [Dupuytren]: Secondary | ICD-10-CM

## 2022-07-19 DIAGNOSIS — T1491XA Suicide attempt, initial encounter: Secondary | ICD-10-CM

## 2022-07-19 NOTE — Progress Notes
[facility] is a 52 y.o. male.    Chief Complaint:  Chief Complaint   Patient presents with    Finger Pain     Was starting to have contracture in middle finger on R hand and got an injection for it but pain has worsened since, pain is radiating to 4 and 5 finger and elbow pain,   Has finger splint on        History of Present Illness:  HPI  He is here with concern of pain and difficulty moving his middle finger on his right hand.  He states around a year ago, while he was at a medical clinic, he had a similar concern and he had an injection completed at the base of his middle finger which improved his symptoms.  He is right-handed.  On chart review I could not find a visit that was associated with this type of procedure or diagnosis.  He states in the past few months the pain and difficulty moving his finger has returned.  He states he will wake up some mornings with his finger in a flexed position and he has to run warm water over to get it to straighten.  He states he is unable to fully straighten his finger due to stiffness and pain.  He denies any prior injuries of his finger.  He states he woke up this morning and he had pain in his elbow that radiated down to his fourth and fifth finger that felt somewhat numb.  This was a new symptom, but it is what made him come to the clinic today for further evaluation.  Review of Systems:  Review of Systems   Constitutional:  Negative for fever.   Skin:  Negative for color change and wound.       Allergies:  Allergies   Allergen Reactions    Sulfa (Sulfonamide Antibiotics) SEE COMMENTS     Patient reports that his father and brother are allergic, he is unsure so he reports it as an allergy.        Past Medical History:  Past Medical History:   Diagnosis Date    Abscess of skin of left wrist 02/05/2021    Cellulitis 05/31/2021    Cellulitis of buttock, left 05/31/2021    Cellulitis of left lower extremity 07/02/2020    Depression     Diverticulitis 02/02/2017 Dizziness Years  ago    Fracture cervical vertebra-closed (HCC)     c6, c7 - no surgical repair    Gastroesophageal reflux disease without esophagitis 10/05/2019    Ordered omeprazole 40mg  qday before breakfast.  Discussed lifestyle modifications. No current red flag symptoms.  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    Suicide attempt [facility])     Tachyarrhythmia June 2019    Valvular heart disease 2019       Past Surgical History:  Surgical History:   Procedure Laterality Date    COLONOSCOPY N/A 06/17/2015    Performed by Jolee Ewing, MD at [facility] ENDO    ROBOT ASSISTED LAPAROSCOPIC NEPHRECTOMY PARTIAL WITH INTRAOPERATIVE ULTRASOUND  Left 06/09/2017    Performed by Daryl Eastern, MD at [facility] OR    BIOPSY/ EXCISION LYMPH NODE DEEP CERVICAL Left 08/11/2020    Performed by Alpha Gula, MD at [facility] OR  INCISION AND DRAINAGE ISCHIORECTAL/ PERIRECTAL ABSCESS N/A 06/02/2021    Performed by Guinevere Scarlet, MD at [facility] OR    HERNIA REPAIR      HX APPENDECTOMY      PR LAPAROSCOPY SURG RPR INITIAL INGUINAL HERNIA      right       Pertinent medical/surgical history reviewed    History obtained from patient.    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 Max of 4,000 mg of acetaminophen in 24 hours.    aspirin 81 mg chewable tablet Chew and swallow 1 Tablet by mouth daily.    atorvastatin (LIPITOR) 10 mg tablet Take one tablet by mouth daily.    docosahexaenoic acid/epa (FISH OIL PO) Take  by mouth daily.    metoprolol succinate XL (TOPROL XL) 25 mg extended release tablet Take 1 Tablet by mouth daily.    sildenafiL (VIAGRA) 50 mg tablet TAKE 1 TABLET AS NEEDED FOR ED    valACYclovir (VALTREX) 1 gram tablet Take two tablets by mouth twice daily.       Vitals:  Vitals:    07/19/22 0723   BP: 124/81   Pulse: 85   Temp: 36.4 ?C (97.6 ?F)   Resp: 16   SpO2: 96%   TempSrc: Oral   PainSc: Eight   Weight: 65.8 kg (145 lb)   Height: 162.6 cm (5' 4)        Physical Exam:  Physical Exam  Vitals and nursing note reviewed.   Constitutional:       General: He is not in acute distress.     Appearance: Normal appearance.   HENT:      Head: Normocephalic and atraumatic.      Nose: Nose normal.   Eyes:  General:         Right eye: No discharge.         Left eye: No discharge.      Extraocular Movements: Extraocular movements intact.      Conjunctiva/sclera: Conjunctivae normal.   Pulmonary:      Effort: No respiratory distress.   Musculoskeletal:      Right elbow: No swelling or deformity. Normal range of motion. No tenderness.      Right hand: Tenderness (Over MCP joint, see diagram for location, no palable nodule/cord) present. No swelling or deformity. Decreased range of motion (Unable to fully extend 3rd finger, flexion is also slightly decreased at MCP joint). Normal strength. Normal sensation. Normal capillary refill.        Hands:       Comments: Ambulating without difficulty   Skin:     Findings: No rash.   Neurological:      Mental Status: He is alert. Mental status is at baseline.   Psychiatric:         Mood and Affect: Mood normal.         Behavior: Behavior normal.         Laboratory Results:   No results found for this or any previous visit (from the past 336 hour(s)).    Radiology Interpretation:    EKG:    Facility Administered Meds:               Clinical Impression:  [facility] is a 52 y.o. 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  He was advised that if this becomes a more persistent problem to seek reevaluation as this may need to be evaluated by orthopedics.  He verbalized understanding of the plan.           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        Procedure Notes:      Patient Instructions:    There are no Patient Instructions on file for this visit.      Jenkins Rouge, MD

## 2022-07-21 ENCOUNTER — Encounter: Admit: 2022-07-21 | Discharge: 2022-07-21 | Payer: No Typology Code available for payment source

## 2022-08-03 ENCOUNTER — Ambulatory Visit: Admit: 2022-08-03 | Discharge: 2022-08-04 | Payer: PRIVATE HEALTH INSURANCE

## 2022-08-03 ENCOUNTER — Encounter: Admit: 2022-08-03 | Discharge: 2022-08-03 | Payer: No Typology Code available for payment source

## 2022-08-03 DIAGNOSIS — L02414 Cutaneous abscess of left upper limb: Secondary | ICD-10-CM

## 2022-08-03 DIAGNOSIS — L0390 Cellulitis, unspecified: Secondary | ICD-10-CM

## 2022-08-03 DIAGNOSIS — J189 Pneumonia, unspecified organism: Secondary | ICD-10-CM

## 2022-08-03 DIAGNOSIS — C801 Malignant (primary) neoplasm, unspecified: Secondary | ICD-10-CM

## 2022-08-03 DIAGNOSIS — A64 Unspecified sexually transmitted disease: Secondary | ICD-10-CM

## 2022-08-03 DIAGNOSIS — K5792 Diverticulitis of intestine, part unspecified, without perforation or abscess without bleeding: Secondary | ICD-10-CM

## 2022-08-03 DIAGNOSIS — S129XXA Fracture of neck, unspecified, initial encounter: Secondary | ICD-10-CM

## 2022-08-03 DIAGNOSIS — C439 Malignant melanoma of skin, unspecified: Secondary | ICD-10-CM

## 2022-08-03 DIAGNOSIS — L03116 Cellulitis of left lower limb: Secondary | ICD-10-CM

## 2022-08-03 DIAGNOSIS — M65331 Trigger finger, right middle finger: Secondary | ICD-10-CM

## 2022-08-03 DIAGNOSIS — T1491XA Suicide attempt, initial encounter: Secondary | ICD-10-CM

## 2022-08-03 DIAGNOSIS — L03317 Cellulitis of buttock: Secondary | ICD-10-CM

## 2022-08-03 DIAGNOSIS — R000 Tachycardia, unspecified: Secondary | ICD-10-CM

## 2022-08-03 DIAGNOSIS — M1990 Unspecified osteoarthritis, unspecified site: Secondary | ICD-10-CM

## 2022-08-03 DIAGNOSIS — R42 Dizziness and giddiness: Secondary | ICD-10-CM

## 2022-08-03 DIAGNOSIS — I38 Endocarditis, valve unspecified: Secondary | ICD-10-CM

## 2022-08-03 DIAGNOSIS — J439 Emphysema, unspecified: Secondary | ICD-10-CM

## 2022-08-03 DIAGNOSIS — K219 Gastro-esophageal reflux disease without esophagitis: Secondary | ICD-10-CM

## 2022-08-03 DIAGNOSIS — B20 Human immunodeficiency virus [HIV] disease: Secondary | ICD-10-CM

## 2022-08-03 DIAGNOSIS — A419 Sepsis, unspecified organism: Secondary | ICD-10-CM

## 2022-08-03 DIAGNOSIS — F32A Depression: Secondary | ICD-10-CM

## 2022-08-03 DIAGNOSIS — L409 Psoriasis, unspecified: Secondary | ICD-10-CM

## 2022-08-03 DIAGNOSIS — C4490 Unspecified malignant neoplasm of skin, unspecified: Secondary | ICD-10-CM

## 2022-08-03 DIAGNOSIS — E785 Hyperlipidemia, unspecified: Secondary | ICD-10-CM

## 2022-08-03 DIAGNOSIS — R011 Cardiac murmur, unspecified: Secondary | ICD-10-CM

## 2022-08-03 MED ORDER — LIDOCAINE-EPINEPHRINE 1 %-1:100,000 IJ SOLN
1.5 mL | Freq: Once | 0 refills | Status: CP
Start: 2022-08-03 — End: ?
  Administered 2022-08-03: 17:00:00 1.5 mL

## 2022-08-03 MED ORDER — TRIAMCINOLONE ACETONIDE 40 MG/ML IJ SUSP
40 mg | Freq: Once | INTRAMUSCULAR | 0 refills | Status: CP
Start: 2022-08-03 — End: ?
  Administered 2022-08-03: 17:00:00 40 mg via INTRAMUSCULAR

## 2022-08-03 NOTE — Progress Notes
Date of Service: 08/03/2022    Subjective:             [facility] is a 52 y.o. male.    History of Present Illness  The patient is a nice 52 year old right hand dominant man who presents with a longstanding issue of a locked finger in the morning, which they have been managing by applying hot water. They report that the finger does not fully extend like the others and this has been ongoing for approximately a year. They recall a similar incident in the past where they received a steroid injection at an urgent care center, which resolved the issue. However, they note that both hands tend to swell significantly in the morning, suggesting possible arthritis.    Additionally, the patient has a noticeable ganglion on their hand, which they express a desire to have removed due to its appearance. They have had a similar ganglion surgically removed in the past, which led to a strep infection and subsequent hospitalization for sepsis. Despite this, they are willing to undergo a procedure to aspirate the current ganglion.    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 They also mention a history of hairdressing, which may have contributed to the current hand conditions.                 Objective:         abacavir-dolutegravir-lamivud (TRIUMEQ) 600-50-300 mg tablet Take 1 Tablet by mouth once daily    acetaminophen (TYLENOL EXTRA STRENGTH) 500 mg tablet Take one tablet to two tablets by mouth every 6 hours as needed for Pain. Max of 4,000 mg of acetaminophen in 24 hours.    aspirin 81 mg chewable tablet Chew and swallow 1 Tablet by mouth daily.    atorvastatin (LIPITOR) 10 mg tablet Take one tablet by mouth daily.    docosahexaenoic acid/epa (FISH OIL PO) Take  by mouth daily.    metoprolol succinate XL (TOPROL XL) 25 mg extended release tablet Take 1 Tablet by mouth daily.    sildenafiL (VIAGRA) 50 mg tablet TAKE 1 TABLET AS NEEDED FOR ED    valACYclovir (VALTREX) 1 gram tablet Take two tablets by mouth twice daily.     Vitals:    08/03/22 1137   BP: 114/64   Pulse: 99   Weight: 68 kg (150 lb)   Height: 165.1 cm (5' 5)     Body mass index is 24.96 kg/m?[facility]     Physical Exam  Constitutional: Well developed, well nourished, in no acute distress  Psychological: normal affect, mood   HEENT: normocephalic, atraumatic, anicteric  Neck: supple, midline trachea, normal ROM   Respiratory: Normal effort, no respiratory distress, no cyanosis  Cardiovascular: visible extremities are warm and well perfused     UPPER EXTREMITY Tenderness: + tenderness over the A1 pulley  right middle finger finger  Observation: obvious triggering  Dorsal mass connected to middle finger extensor tendon  Neurovascular: Normal motor and sensory exam of the ulnar, radial and median nerves, fingers well perfused with normal capillary refill.  Swelling/effusions: Minimal swelling of the extremity. Forearm and hand compartments soft, no pain with passive motion of the fingers.  Skin: intact, normal color and temperature  Motor: bilateral range of motion, muscle tone, muscle symmetry and coordination are unremarkable.   Range of Motion:  Elbows: Normal carrying angle and a full arc of motion into flexion and extension, supination  and pronation.  Wrists: Full and symmetric arc of motion.    All fingers demonstrated a full, free and uninhibited range of motion with the exception of the following digits: Middle finger MCP 10/90    IMAGING: none         Assessment and Plan:   IMPRESSION/PLAN    [facility] is a 52 y.o. male with a right middle finger Trigger finger     We had a nice discussion today about the anatomy of the flexor tendon sheath and the pathophysiology of trigger finger, or stenosing tenosynovitis, as well as the options for treatment. These include observation, nighttime extension splinting, steroid injections, and surgery with an open release of the A1 pulley. Steroid injections can be done in the office and can relieve symptoms about 50% of the time. There is a chance of recurrence, but many patients can get up to several months of relief. Surgical release is successful more than 90-95% of the time but does require an operation. It can be done as an outpatient and under local anesthesia with or without IV sedation. Risks of surgery include, but are not limited to, bleeding, infection, abnormal scar, nerve injury, tendon injury, stiffness, recurrence, & failure to improve.     [facility] has elected injection      [facility] is a 52 y.o. right-hand dominant male with a right dorsal ganglion.  In clinic today, we reviewed the pathology, natural history and options for treatment: observation, aspiration and surgical excision. Risks and benefits were reviewed in detail and all questions answered. ADREW Walls has elected aspiration and understands that there is a high chance of recurrence.     Patient has elected excision in office under local only        [facility]., MD  08/03/22 11:52 AM

## 2022-08-03 NOTE — Procedures
Procedure:  right middle finger trigger finger cortisone injection/injection of flexor tendon sheath  Diagnosis:  right middle finger trigger finger    Consent obtained, timeout done, skin prepped with alcohol  1 mL 1% lidocaine infiltrated for local anesthesia  1 mL kenalog 40 injected into sheath  Patient tolerated the procedure well

## 2022-08-04 ENCOUNTER — Encounter: Admit: 2022-08-04 | Discharge: 2022-08-04 | Payer: No Typology Code available for payment source

## 2022-08-04 DIAGNOSIS — R2231 Localized swelling, mass and lump, right upper limb: Secondary | ICD-10-CM

## 2022-08-04 DIAGNOSIS — M720 Palmar fascial fibromatosis [Dupuytren]: Secondary | ICD-10-CM

## 2022-08-04 NOTE — Telephone Encounter
Patient called with concern of locked finger following injection yesterday.    Called and discussed with patient. Explained that this can happen post injection as we are injecting fluid into an already tight space and it can cause some swelling and triggering as the medicine gets absorbed.     Patient voiced understanding.

## 2022-08-10 ENCOUNTER — Encounter: Admit: 2022-08-10 | Discharge: 2022-08-10 | Payer: No Typology Code available for payment source

## 2022-08-10 DIAGNOSIS — A64 Unspecified sexually transmitted disease: Secondary | ICD-10-CM

## 2022-08-10 DIAGNOSIS — J439 Emphysema, unspecified: Secondary | ICD-10-CM

## 2022-08-10 DIAGNOSIS — M1990 Unspecified osteoarthritis, unspecified site: Secondary | ICD-10-CM

## 2022-08-10 DIAGNOSIS — A419 Sepsis, unspecified organism: Secondary | ICD-10-CM

## 2022-08-10 DIAGNOSIS — L409 Psoriasis, unspecified: Secondary | ICD-10-CM

## 2022-08-10 DIAGNOSIS — L03116 Cellulitis of left lower limb: Secondary | ICD-10-CM

## 2022-08-10 DIAGNOSIS — L02414 Cutaneous abscess of left upper limb: Secondary | ICD-10-CM

## 2022-08-10 DIAGNOSIS — R42 Dizziness and giddiness: Secondary | ICD-10-CM

## 2022-08-10 DIAGNOSIS — R000 Tachycardia, unspecified: Secondary | ICD-10-CM

## 2022-08-10 DIAGNOSIS — L0390 Cellulitis, unspecified: Secondary | ICD-10-CM

## 2022-08-10 DIAGNOSIS — K5792 Diverticulitis of intestine, part unspecified, without perforation or abscess without bleeding: Secondary | ICD-10-CM

## 2022-08-10 DIAGNOSIS — E785 Hyperlipidemia, unspecified: Secondary | ICD-10-CM

## 2022-08-10 DIAGNOSIS — I38 Endocarditis, valve unspecified: Secondary | ICD-10-CM

## 2022-08-10 DIAGNOSIS — F32A Depression: Secondary | ICD-10-CM

## 2022-08-10 DIAGNOSIS — B20 Human immunodeficiency virus [HIV] disease: Secondary | ICD-10-CM

## 2022-08-10 DIAGNOSIS — C801 Malignant (primary) neoplasm, unspecified: Secondary | ICD-10-CM

## 2022-08-10 DIAGNOSIS — C439 Malignant melanoma of skin, unspecified: Secondary | ICD-10-CM

## 2022-08-10 DIAGNOSIS — T1491XA Suicide attempt, initial encounter: Secondary | ICD-10-CM

## 2022-08-10 DIAGNOSIS — R011 Cardiac murmur, unspecified: Secondary | ICD-10-CM

## 2022-08-10 DIAGNOSIS — L03317 Cellulitis of buttock: Secondary | ICD-10-CM

## 2022-08-10 DIAGNOSIS — S129XXA Fracture of neck, unspecified, initial encounter: Secondary | ICD-10-CM

## 2022-08-10 DIAGNOSIS — C4490 Unspecified malignant neoplasm of skin, unspecified: Secondary | ICD-10-CM

## 2022-08-10 DIAGNOSIS — J189 Pneumonia, unspecified organism: Secondary | ICD-10-CM

## 2022-08-10 DIAGNOSIS — K219 Gastro-esophageal reflux disease without esophagitis: Secondary | ICD-10-CM

## 2022-08-11 ENCOUNTER — Emergency Department: Admit: 2022-08-11 | Discharge: 2022-08-10 | Payer: PRIVATE HEALTH INSURANCE

## 2022-08-11 ENCOUNTER — Emergency Department
Admit: 2022-08-11 | Discharge: 2022-08-11 | Disposition: A | Discharge status: Home / Self Care | Payer: PRIVATE HEALTH INSURANCE

## 2022-08-11 ENCOUNTER — Emergency Department: Admit: 2022-08-11 | Discharge: 2022-08-11 | Payer: No Typology Code available for payment source

## 2022-08-11 DIAGNOSIS — Z23 Encounter for immunization: Secondary | ICD-10-CM

## 2022-08-11 DIAGNOSIS — S61412A Laceration without foreign body of left hand, initial encounter: Secondary | ICD-10-CM

## 2022-08-11 MED ORDER — SODIUM CHLORIDE 0.9 % IR SOLN
Freq: Once | 0 refills | Status: CP
Start: 2022-08-11 — End: ?
  Administered 2022-08-11: 19:00:00 1000.000 mL

## 2022-08-11 MED ORDER — DIPHTH,PERTUS(ACELL),TETANUS 2.5-8-5 LF-MCG-LF/0.5ML IM SYRG
.5 mL | Freq: Once | INTRAMUSCULAR | 0 refills | Status: CP
Start: 2022-08-11 — End: ?
  Administered 2022-08-11: 18:00:00 0.5 mL via INTRAMUSCULAR

## 2022-08-11 MED ORDER — LIDOCAINE HCL 10 MG/ML (1 %) IJ SOLN
10 mL | Freq: Once | INTRAMUSCULAR | 0 refills | Status: CP
Start: 2022-08-11 — End: ?
  Administered 2022-08-11: 19:00:00 10 mL via INTRAMUSCULAR

## 2022-08-11 NOTE — ED Notes
ED Initial Provider Note:    This patient was seen in the ED triage area to initiate and expedite the patients ED care when possible.    ED Chief Complaint:   Chief Complaint   Patient presents with    Hand Injury     Lac to left knuckle, in ED last night and said metal was in it but left d/t hallbed and wait time; bleeding controlled       S: Charles Walls is a 52 y.o. male who presents to the Emergency Department for left hand laceration--had x-ray lastnight. Cut with a saw around 11pm    PMHx:  Past Medical History:   Diagnosis Date    Abscess of skin of left wrist 02/05/2021    Arthritis N/A    Cancer (HCC) 01/19    Cellulitis 05/31/2021    Cellulitis of buttock, left 05/31/2021    Cellulitis of left lower extremity 07/02/2020    Depression     Diverticulitis 02/02/2017    Dizziness Years  ago    Fracture cervical vertebra-closed (HCC)     c6, c7 - no surgical repair    Gastroesophageal reflux disease without esophagitis 10/05/2019    Ordered omeprazole 40mg  qday before breakfast.  Discussed lifestyle modifications. No current red flag symptoms.  Hx of PUD in high school    Heart murmur     Human immunodeficiency virus (HIV) disease (HCC)     Hyperlipidemia 06/13/2013    Melanoma (HCC)     Pneumonia     Psoriasis, unspecified 02/21/2014    Pulmonary emphysema (HCC) Dk    Sepsis (HCC) 03/20/2019    Sexually transmitted disease ???    Skin cancer     Suicide attempt [facility])     Tachyarrhythmia June 2019    Valvular heart disease 2019       There were no vitals taken for this visit.   O: Brief Physical: dressing on hand left--intact    A/P: The patient was seen by me as an initial provider in triage. A brief history and physical was obtained. My exam is intended to be an initial medial screening exam. Initial orders have been placed by me. My working diagnosis is hand laceration.    The patient is deemed appropriate for the main ED. The patient's care will be resumed by the ED provider care team once the patient is roomed in the ED. A more detailed / complete H&P will be documented by those providers.

## 2022-08-11 NOTE — ED Notes
Patient educated on discharge instructions, home care, and follow up care with ED to have stiches removed. Patient verbalized understanding and had all questions answered by Gunnar Fusi, NP. Pt accompanied out by this RN.     Patient left with all belongings.

## 2022-08-11 NOTE — Unmapped
You are being seen today for a laceration to your hand.  You have yet received your adult Tdap IM.  You are encouraged to gently clean this area with soap and water keep the dressing dry.  Return for suture removal in 7 to 10 days.  Avoid submerging hand in water watch for redness or infection.

## 2022-08-11 NOTE — ED Triage Notes
PT TETANUS STATUS >5 YEARS

## 2022-08-11 NOTE — ED Notes
Patient is a 52 year old male who came into the ED today with c/o hand injury. Pt states that he cut his hand on hand saw yesterday and was seen in this ED. Pt was told that x-ray showed a piece of metal in his hand, but pt left before treatment was complete. Pt denies CP, SOB, N/V/D, constipation, abdominal pain, and/or fever/chills. Pt is alert, oriented, breathing is non-labored, skin is appropriate to age and ethnicity. Pt resting in bed, locked in lowest position, side rails up, call light within reach.

## 2022-08-16 ENCOUNTER — Encounter: Admit: 2022-08-16 | Discharge: 2022-08-16 | Payer: No Typology Code available for payment source

## 2022-08-16 NOTE — Telephone Encounter
ED Discharge Follow Up  Reached patient: No. Sent MyChart message per protocol.   Patient Date of Birth: [birth year]  Admission Information  Hospital [facility] : [facility]  ED Admission Date: 08/11/22   ED Discharge Date: 08/11/22   Admission Diagnosis:  Hand Injury  Discharge Diagnosis: Laceration of Left hand without foreign body, initial encounter  Hospital Services: Unplanned  Today's call is 5 (calendar) days post discharge    Medication Reconciliation  Changes to pre-ED visit medications? No  Were new prescriptions filled? N/A    Meds reviewed and reconciled? No, same as AVS  Current Outpatient Medications   Medication Instructions    abacavir-dolutegravir-lamivud (TRIUMEQ) 600-50-300 mg tablet 1 tablet, Oral, DAILY    acetaminophen (TYLENOL EXTRA STRENGTH) 500-1,000 mg, Oral, EVERY  6 HOURS PRN, Max of 4,000 mg of acetaminophen in 24 hours.    aspirin 81 mg chewable tablet Chew and swallow 1 Tablet by mouth daily.    atorvastatin (LIPITOR) 10 mg, Oral, DAILY    docosahexaenoic acid/epa (FISH OIL PO) Oral, DAILY    metoprolol succinate XL (TOPROL XL) 25 mg, Oral, DAILY    sildenafiL (VIAGRA) 50 mg tablet TAKE 1 TABLET AS NEEDED FOR ED    valACYclovir (VALTREX) 2,000 mg, Oral, TWICE DAILY      Scheduling Follow-up Appointment  Upcoming appointments:   Future Appointments   Date Time Provider Department Center   08/17/2022  1:00 PM Gracy Bruins., MD [id number] Plastic Surg   09/02/2022  1:30 PM [place] [id number] Procedur   09/02/2022  3:15 PM Julienne Kass, MD [facility] C[facility] Exam   09/10/2022  1:00 PM Theodoro Grist, MD [facility]     When was patient?s last PCP visit: Visit date not found  PCP primary location: [facility]  PCP appointment scheduled? No, routine appt on 09/10/22  Specialist appointment scheduled? Yes, with Plastic Surgery 08/17/22    Is assistance with transportation needed?  N/A  MyChart message sent? Active in MyChart. MyChart message sent.  Artera text sent? No    ED Communication   Did patient call clinic prior to going to ED? No  Reason patient went to ED: Unable to obtain    Marthann Schiller, RN

## 2022-08-17 ENCOUNTER — Ambulatory Visit: Admit: 2022-08-17 | Discharge: 2022-08-18 | Payer: PRIVATE HEALTH INSURANCE

## 2022-08-17 ENCOUNTER — Encounter: Admit: 2022-08-17 | Discharge: 2022-08-17 | Payer: No Typology Code available for payment source

## 2022-08-17 DIAGNOSIS — R011 Cardiac murmur, unspecified: Secondary | ICD-10-CM

## 2022-08-17 DIAGNOSIS — L03116 Cellulitis of left lower limb: Secondary | ICD-10-CM

## 2022-08-17 DIAGNOSIS — B20 Human immunodeficiency virus [HIV] disease: Secondary | ICD-10-CM

## 2022-08-17 DIAGNOSIS — J439 Emphysema, unspecified: Secondary | ICD-10-CM

## 2022-08-17 DIAGNOSIS — L02414 Cutaneous abscess of left upper limb: Secondary | ICD-10-CM

## 2022-08-17 DIAGNOSIS — C801 Malignant (primary) neoplasm, unspecified: Secondary | ICD-10-CM

## 2022-08-17 DIAGNOSIS — R42 Dizziness and giddiness: Secondary | ICD-10-CM

## 2022-08-17 DIAGNOSIS — L03317 Cellulitis of buttock: Secondary | ICD-10-CM

## 2022-08-17 DIAGNOSIS — K5792 Diverticulitis of intestine, part unspecified, without perforation or abscess without bleeding: Secondary | ICD-10-CM

## 2022-08-17 DIAGNOSIS — M1990 Unspecified osteoarthritis, unspecified site: Secondary | ICD-10-CM

## 2022-08-17 DIAGNOSIS — J189 Pneumonia, unspecified organism: Secondary | ICD-10-CM

## 2022-08-17 DIAGNOSIS — T1491XA Suicide attempt, initial encounter: Secondary | ICD-10-CM

## 2022-08-17 DIAGNOSIS — R000 Tachycardia, unspecified: Secondary | ICD-10-CM

## 2022-08-17 DIAGNOSIS — I38 Endocarditis, valve unspecified: Secondary | ICD-10-CM

## 2022-08-17 DIAGNOSIS — L0390 Cellulitis, unspecified: Secondary | ICD-10-CM

## 2022-08-17 DIAGNOSIS — F32A Depression: Secondary | ICD-10-CM

## 2022-08-17 DIAGNOSIS — A419 Sepsis, unspecified organism: Secondary | ICD-10-CM

## 2022-08-17 DIAGNOSIS — S129XXA Fracture of neck, unspecified, initial encounter: Secondary | ICD-10-CM

## 2022-08-17 DIAGNOSIS — C4490 Unspecified malignant neoplasm of skin, unspecified: Secondary | ICD-10-CM

## 2022-08-17 DIAGNOSIS — L409 Psoriasis, unspecified: Secondary | ICD-10-CM

## 2022-08-17 DIAGNOSIS — C439 Malignant melanoma of skin, unspecified: Secondary | ICD-10-CM

## 2022-08-17 DIAGNOSIS — A64 Unspecified sexually transmitted disease: Secondary | ICD-10-CM

## 2022-08-17 DIAGNOSIS — E785 Hyperlipidemia, unspecified: Secondary | ICD-10-CM

## 2022-08-17 DIAGNOSIS — K219 Gastro-esophageal reflux disease without esophagitis: Secondary | ICD-10-CM

## 2022-08-17 MED ORDER — LIDOCAINE-EPINEPHRINE 1 %-1:100,000 IJ SOLN
10 mL | Freq: Once | 0 refills | Status: CP
Start: 2022-08-17 — End: ?
  Administered 2022-08-17: 18:00:00 10 mL

## 2022-08-17 NOTE — Progress Notes
Date of Service: 08/17/2022    Subjective:             Charles Walls is a 52 y.o. male.    History of Present Illness  The patient is a nice 52 year old right hand dominant man who presents with a longstanding issue of a locked finger in the morning, which they have been managing by applying hot water. They report that the finger does not fully extend like the others and this has been ongoing for approximately a year. They recall a similar incident in the past where they received a steroid injection at an urgent care center, which resolved the issue. However, they note that both hands tend to swell significantly in the morning, suggesting possible arthritis.    Additionally, the patient has a noticeable ganglion on their hand, which they express a desire to have removed due to its appearance. They have had a similar ganglion surgically removed in the past, which led to a strep infection and subsequent hospitalization for sepsis. Despite this, they are willing to undergo a procedure to aspirate the current ganglion.    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 They also mention a history of hairdressing, which may have contributed to the current hand conditions.    Patient is here today for excision of his dorsal ganglion.               Objective:         abacavir-dolutegravir-lamivud (TRIUMEQ) 600-50-300 mg tablet Take 1 Tablet by mouth once daily    acetaminophen (TYLENOL EXTRA STRENGTH) 500 mg tablet Take one tablet to two tablets by mouth every 6 hours as needed for Pain. Max of 4,000 mg of acetaminophen in 24 hours.    aspirin 81 mg chewable tablet Chew and swallow 1 Tablet by mouth daily.    atorvastatin (LIPITOR) 10 mg tablet Take one tablet by mouth daily.    docosahexaenoic acid/epa (FISH OIL PO) Take  by mouth daily.    metoprolol succinate XL (TOPROL XL) 25 mg extended release tablet Take 1 Tablet by mouth daily.    sildenafiL (VIAGRA) 50 mg tablet TAKE 1 TABLET AS NEEDED FOR ED    valACYclovir (VALTREX) 1 gram tablet Take two tablets by mouth twice daily.     Vitals:    08/17/22 1244   BP: 111/60   Pulse: 82   Temp: 36.7 ?C (98.1 ?F)   Resp: 15   TempSrc: Oral   PainSc: Zero   Weight: 68 kg (150 lb)   Height: 165.1 cm (5' 5)     Body mass index is 24.96 kg/m?Marland Kitchen     Physical Exam  Constitutional: Well developed, well nourished, in no acute distress  Psychological: normal affect, mood   HEENT: normocephalic, atraumatic, anicteric  Neck: supple, midline trachea, normal ROM   Respiratory: Normal effort, no respiratory distress, no cyanosis  Cardiovascular: visible extremities are warm and well perfused     UPPER EXTREMITY Tenderness: + tenderness over the A1 pulley  right middle finger finger  Observation: obvious triggering  Dorsal mass connected to middle finger extensor tendon  Neurovascular: Normal motor and sensory exam of the ulnar, radial and median nerves, fingers well perfused with normal capillary refill.  Swelling/effusions: Minimal swelling of the extremity. Forearm and hand compartments soft, no pain with passive motion of the fingers.  Skin: intact, normal color and temperature  Motor: bilateral range of  motion, muscle tone, muscle symmetry and coordination are unremarkable.   Range of Motion:  Elbows: Normal carrying angle and a full arc of motion into flexion and extension, supination and pronation.  Wrists: Full and symmetric arc of motion.    All fingers demonstrated a full, free and uninhibited range of motion with the exception of the following digits: Middle finger MCP 10/90    IMAGING: none         Assessment and Plan:   IMPRESSION/PLAN    Charles Walls is a 52 y.o. male with a right middle finger Trigger finger     We had a nice discussion today about the anatomy of the flexor tendon sheath and the pathophysiology of trigger finger, or stenosing tenosynovitis, as well as the options for treatment. These include observation, nighttime extension splinting, steroid injections, and surgery with an open release of the A1 pulley. Steroid injections can be done in the office and can relieve symptoms about 50% of the time. There is a chance of recurrence, but many patients can get up to several months of relief. Surgical release is successful more than 90-95% of the time but does require an operation. It can be done as an outpatient and under local anesthesia with or without IV sedation. Risks of surgery include, but are not limited to, bleeding, infection, abnormal scar, nerve injury, tendon injury, stiffness, recurrence, & failure to improve.     Charles Walls has elected injection      Charles Walls is a 52 y.o. right-hand dominant male with a right dorsal ganglion.  In clinic today, we reviewed the pathology, natural history and options for treatment: observation, aspiration and surgical excision. Risks and benefits were reviewed in detail and all questions answered. Charles Walls has elected aspiration and understands that there is a high chance of recurrence.     Patient has elected excision in office under local only this was excised today.  We usually hold all absorbable stitches placed, can follow-up as needed        Gracy Bruins., MD  08/17/22 12:59 PM

## 2022-08-18 DIAGNOSIS — R2231 Localized swelling, mass and lump, right upper limb: Secondary | ICD-10-CM

## 2022-08-25 ENCOUNTER — Encounter: Admit: 2022-08-25 | Discharge: 2022-08-25 | Payer: No Typology Code available for payment source

## 2022-08-26 ENCOUNTER — Encounter: Admit: 2022-08-26 | Discharge: 2022-08-26 | Payer: No Typology Code available for payment source

## 2022-08-26 MED ORDER — SILDENAFIL 50 MG PO TAB
50 mg | ORAL_TABLET | ORAL | 2 refills | 45.00000 days | Status: AC | PRN
Start: 2022-08-26 — End: ?

## 2022-09-01 ENCOUNTER — Encounter: Admit: 2022-09-01 | Discharge: 2022-09-01 | Payer: 59

## 2022-09-01 NOTE — Telephone Encounter
Cloyd Volcy (Key: [id number]) - [id number]  Sildenafil Citrate 50MG  tablets  status: PA Request-Created: August 9th, 2024 [phone]-NFAO: August 14th, 2024

## 2022-09-02 ENCOUNTER — Ambulatory Visit: Admit: 2022-09-02 | Discharge: 2022-09-02 | Payer: No Typology Code available for payment source

## 2022-09-02 ENCOUNTER — Encounter: Admit: 2022-09-02 | Discharge: 2022-09-02 | Payer: No Typology Code available for payment source

## 2022-09-02 ENCOUNTER — Ambulatory Visit: Admit: 2022-09-02 | Discharge: 2022-09-03 | Payer: No Typology Code available for payment source

## 2022-09-02 DIAGNOSIS — L409 Psoriasis, unspecified: Secondary | ICD-10-CM

## 2022-09-02 DIAGNOSIS — C4490 Unspecified malignant neoplasm of skin, unspecified: Secondary | ICD-10-CM

## 2022-09-02 DIAGNOSIS — B20 Human immunodeficiency virus [HIV] disease: Secondary | ICD-10-CM

## 2022-09-02 DIAGNOSIS — I5032 Chronic diastolic (congestive) heart failure: Secondary | ICD-10-CM

## 2022-09-02 DIAGNOSIS — I38 Endocarditis, valve unspecified: Secondary | ICD-10-CM

## 2022-09-02 DIAGNOSIS — A64 Unspecified sexually transmitted disease: Secondary | ICD-10-CM

## 2022-09-02 DIAGNOSIS — I350 Nonrheumatic aortic (valve) stenosis: Secondary | ICD-10-CM

## 2022-09-02 DIAGNOSIS — I351 Nonrheumatic aortic (valve) insufficiency: Secondary | ICD-10-CM

## 2022-09-02 DIAGNOSIS — I700 Atherosclerosis of aorta: Secondary | ICD-10-CM

## 2022-09-02 DIAGNOSIS — C439 Malignant melanoma of skin, unspecified: Secondary | ICD-10-CM

## 2022-09-02 DIAGNOSIS — R011 Cardiac murmur, unspecified: Secondary | ICD-10-CM

## 2022-09-02 DIAGNOSIS — K5792 Diverticulitis of intestine, part unspecified, without perforation or abscess without bleeding: Secondary | ICD-10-CM

## 2022-09-02 DIAGNOSIS — J439 Emphysema, unspecified: Secondary | ICD-10-CM

## 2022-09-02 DIAGNOSIS — L03317 Cellulitis of buttock: Secondary | ICD-10-CM

## 2022-09-02 DIAGNOSIS — C8591 Non-Hodgkin lymphoma, unspecified, lymph nodes of head, face, and neck: Secondary | ICD-10-CM

## 2022-09-02 DIAGNOSIS — L0390 Cellulitis, unspecified: Secondary | ICD-10-CM

## 2022-09-02 DIAGNOSIS — R42 Dizziness and giddiness: Secondary | ICD-10-CM

## 2022-09-02 DIAGNOSIS — Q231 Congenital insufficiency of aortic valve: Secondary | ICD-10-CM

## 2022-09-02 DIAGNOSIS — L03116 Cellulitis of left lower limb: Secondary | ICD-10-CM

## 2022-09-02 DIAGNOSIS — T1491XA Suicide attempt, initial encounter: Secondary | ICD-10-CM

## 2022-09-02 DIAGNOSIS — M1990 Unspecified osteoarthritis, unspecified site: Secondary | ICD-10-CM

## 2022-09-02 DIAGNOSIS — L02414 Cutaneous abscess of left upper limb: Secondary | ICD-10-CM

## 2022-09-02 DIAGNOSIS — C801 Malignant (primary) neoplasm, unspecified: Secondary | ICD-10-CM

## 2022-09-02 DIAGNOSIS — R000 Tachycardia, unspecified: Secondary | ICD-10-CM

## 2022-09-02 DIAGNOSIS — E785 Hyperlipidemia, unspecified: Secondary | ICD-10-CM

## 2022-09-02 DIAGNOSIS — F32A Depression: Secondary | ICD-10-CM

## 2022-09-02 DIAGNOSIS — A419 Sepsis, unspecified organism: Secondary | ICD-10-CM

## 2022-09-02 DIAGNOSIS — J189 Pneumonia, unspecified organism: Secondary | ICD-10-CM

## 2022-09-02 DIAGNOSIS — K219 Gastro-esophageal reflux disease without esophagitis: Secondary | ICD-10-CM

## 2022-09-02 DIAGNOSIS — E782 Mixed hyperlipidemia: Secondary | ICD-10-CM

## 2022-09-02 DIAGNOSIS — S129XXA Fracture of neck, unspecified, initial encounter: Secondary | ICD-10-CM

## 2022-09-02 MED ORDER — FUROSEMIDE 40 MG PO TAB
40 mg | ORAL_TABLET | Freq: Every morning | ORAL | 1 refills | 90.00000 days | Status: AC
Start: 2022-09-02 — End: ?

## 2022-09-02 MED ORDER — POTASSIUM CHLORIDE 20 MEQ PO TBTQ
20 meq | ORAL_TABLET | Freq: Every day | ORAL | 1 refills | 30.00000 days | Status: AC
Start: 2022-09-02 — End: ?

## 2022-09-02 NOTE — Patient Instructions
Follow-Up:    -Thank you for allowing Charles Walls to participate in your care today. Your After Visit Summary is being completed by Pernell Dupre, RN.    -We would like you to follow up in  1 years with [name], MD  -The schedule is released approximately 4-5 months in advance. You will be called by our scheduling department to make an appointment and you will also receive a notification via MyChart to self-schedule.  However, if you would like to call to make this appointment, please call [phone].You will have an echocardiogram prior to the visit also.    Changes From Today's Office Visit   Please start taking Furosemide 40mg  daily and Potassium Chloride daily.   Please check lab work in 1 week. Basic Metabolic Panel. You do not need to fast for this lab.     For lab orders:  If you are able to come to a [place] location, orders are in the system   [facility] outpatient lab: [address]  Phone: [phone]   [facility] lab: [address] [place]    Phone: [phone]   [facility]: [address] [place], [place]    Phone: [phone]   [place] at [facility]: [address]   Phone: [phone]    If you require an outside location, take a printed lab order or have order faxed   [facility] has locations around the city    [place] location: [address]   Phone: [phone]    [place]: [address] [place], [place]   Phone: [phone]    [place]: [address], [place], [place]   Phone: [phone]       [facility] has locations around the city    [place] location: [address] [place], [place]  Phone: [phone]    [facility] location: [address], [place], [place]  Phone: [phone]      [facility] location: [address] [place], [place]   Phone: [phone]    [facility] location: [address], [place], [place]  Phone: [phone]                [place] area labs:  1. [facility] - which is off of [address] past [address].  Their fax number is [phone]  2. [facility] - which is off of the [address] behind [address].  Their fax number is [phone].  3. [facility] - usually outpatient labs are completed in [place].  Their fax number is [phone].       Contacting our office:    -Business Hours: Monday-Friday, 8:00 am-4:30 pm (excluding Holidays).     -For medical questions or concerns, please send Charles Walls a message through your MyChart account or call the [facility] team nursing triage line at [phone]. Please leave a detailed message with your name, date of birth, and reason for your call.  If your message is received before 3:30pm, every effort will be made to call you back the same day.  Please allow time for Charles Walls to review your chart prior to call back.     -For medication refills please start by contacting your pharmacy. You can also send Charles Walls a prescription question through your MyChart or call the nurse triage line above.     -Should you have an immediate need of the weekend/nights and holidays, please call our on-call triage line at [phone].    [facility]  nursing team fax number: [phone]    -You may receive a survey in the upcoming weeks from [facility] [place] [facility]. Your feedback is important to Charles Walls and helps Charles Walls continue to improve patient care and patient satisfaction.     -Please feel free to call our Financial Department at [phone] with any questions or concerns about estimated cost of testing or imaging ordered today. We are happy to provide CPT codes upon request.    Results & Testing Follow Up:    -Please allow 5-7 business days for the results of any testing to be reviewed. Please call our office if you have not heard from a nurse within this time frame.    -Should you choose to complete testing at an outside facility, please contact our office after completion of testing so that we can ensure that we have received results for your provider to review.    Lab and test results:  As a part of the CARES act, starting 04/19/2019, some results will be released to you via MyChart immediately and automatically.  You may see results before your provider sees them; however, your provider will review all these results and then they, or one of their team, will notify you of result information and recommendations.   Critical results will be addressed immediately, but otherwise, please allow Charles Walls time to get back with you prior to you reaching out to Charles Walls for questions.  This will usually take about 72 hours for labs and 5-7 days for procedure test results.

## 2022-09-02 NOTE — Procedures
Patient no longer in lab area. Will be available if patient returns.

## 2022-09-02 NOTE — Progress Notes
Date of Service: 09/02/2022    Charles Walls is a 52 y.o. male.           CARDIOLOGY CONSULT PROGRESS NOTE    Charles Walls                      Events  Hemodynamic parameters: Normal blood pressure and heart rate noted in our office today.    Subjective:  Worsening in dyspnea reported by the patient  Mild excessive volume noted on physical examination  Blood pressure is normal at home  No significant anginal symptoms  Most of the chest discomfort symptoms he reports is consistent with pleurisy              Assessment & Recs     1.  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  Moderate aortic valve insufficiency  3.  Elevated coronary artery calcium score  4.  Mixed hyperlipidemia  5.  HIV infection    53 year old gentleman with history of HIV infection and a bicuspid aortic valve.  He has a history of moderate aortic valve insufficiency and mild aortic valve stenosis and presents for annual follow-up.  Worsening in dyspnea reported by the patient.  This is not lifestyle limiting in nature.  He is caring for his mother who is also his neighbor.  She has been diagnosed with Alzheimer's and states that he is helping her out a lot and fatigue starts to set it.  No recent heart failure admissions.  Not taking any loop diuretics.  Pulm blood pressures within normal limits.  No anginal symptoms noted but pleurisy reported by the patient.  Previously, he was noted to have an elevated coronary artery calcium score and underwent a pharmacological stress test in December 2023 which was unremarkable for ischemia.  No high risk features were identified.  No bleeding complications on aspirin.  Ascending aorta does not demonstrate significant dilation based upon CTA assessment at the past.    Recommendations    1.  Reviewed his surface echocardiogram from today.  He has a preserved LVEF, normal cavitary dimensions.  Bicuspid aortic valve with moderate aortic valve stenosis.  Moderate aortic valve insufficiency noted.  Pressure half-time's are in the high 300s, occasionally in the 200s.  No holodiastolic descending thoracic aortic reversal identified.  Doppler parameters on today's study is as follows    Aortic valve area 2.61 cm2         Aortic valve mean gradient 17.80 mmHg         Aortic valve peak gradient 33 mmHg         Aortic valve peak velocity 2.9 m/s         Aortic valve velocity ratio 0.62         Aortic valve regurgitation pressure halftime 358 ms             Previous study demonstrated the following parameters:  Aortic valve peak velocity 2.14 m/s         Aortic valve velocity ratio 0.65         Aortic valve regurgitation pressure halftime 322.35 ms           Overall, progression in his aortic valve stenosis is noted to mild.  Previously sclerosis was noted without significant stenosis, now moderate stenosis noted.  AI remains similar in magnitude.  2.  Recommend starting loop diuretics in the form of Lasix 40 mg p.o. once daily with potassium 20  mill equivalents per day with repeat creatinine and potassium in 1 week  3.  2D with Doppler in 1 year to assess for progression in aortic valve stenosis/insufficiency  4.  Continue aspirin 81 mg daily.  Pharmacological stress test was unremarkable for ischemia  5.  LDL is close to target of 70 mg/dL.  Currently 74 mg/dL.  No change in dosing recommended.  Continue Lipitor 10 nightly.  No statin side effects.  6.  Currently does not meet criteria for aortic valve intervention.  Clinic follow-up in 1 year with me after an echocardiogram.      Charles Cluck, MD, [facility], [facility]  Interventional Cardiologist           Medications  Current medications reviewed    Review Of Systems  None besides facts mentioned above      Physical Exam                          Vital Signs: Most Recent   Vitals:    09/02/22 1424   BP: 134/90   BP Source: Arm, Left Upper   Pulse: 99   PainSc: Zero   Weight: 67.6 kg (149 lb)   Height: 165.1 cm (5' 5)          General Appearance: moderately overweight, no distress   Skin: warm, no ulcers or xanthomas; few ecchymoses   Eyes: conjunctivae and lids normal, pupils are equal and round   Neck Veins: normal JVP , neck veins are not distended   Thyroid: no nodules, masses, tenderness or enlargement   Cardiovascular system: Pulse 86/min, regular rhythm , normal volume, no specific character, felt equally in all peripheries and there was no radio femoral delay.  PMI undisplaced, no palpable thrills/heaves, S1 S2 heard, no murmurs, no rub, no jugular venous distension, no carotid bruit.  Peripheral Circulation: normal peripheral circulation   Pedal Pulses: normal symmetric pedal pulses   Carotid Arteries: normal carotid upstroke bilaterally, no bruits   Respiratory system: No acute distress  No use of accessory muscles  Normal vesicular breath sounds over all the lung fields bilaterally  No added sounds      Lab/Radiology/Other Diagnostic Tests:    Pertinent labs, cardiac imaging and EKG reviewed.    Total Time Today was 40 minutes in the following activities: Preparing to see the patient, Obtaining and/or reviewing separately obtained history, Performing a medically appropriate examination and/or evaluation, Counseling and educating the patient/family/caregiver, Ordering medications, tests, or procedures, Referring and communication with other health care professionals (when not separately reported), Documenting clinical information in the electronic or other health record, Independently interpreting results (not separately reported) and communicating results to the patient/family/caregiver, and Care coordination (not separately reported)      Vitals:    09/02/22 1424   BP: 134/90   BP Source: Arm, Left Upper   Pulse: 99   PainSc: Zero   Weight: 67.6 kg (149 lb)   Height: 165.1 cm (5' 5)     Body mass index is 24.79 kg/m?Marland Kitchen     Past Medical History  Patient Active Problem List    Diagnosis Date Noted    Lymphoma of lymph nodes of neck, 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 Dense aortic valve calcification.      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  Last used 2 months ago      Lymphadenopathy, inguinal 01/11/2018     He reported to the [place] ED in Nov with inguinal adenopathy and penile ulcer  He had recent penile piercing removed  He is homosexual with sexual relationships with his husband via anal intercourse; he is the penetrating partner  He also has history of HIV and syphillis  He was discharged on treatment of STI  Then reported to [facility] ED where he had a CT showing inguinal lymphadenopathy    Exam today showed an right inguinal abscess and induration below  No other lymphadenopathy or penile lesions noted      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   HENT: Negative.     Eyes: Negative.    Cardiovascular:  Positive for chest pain.   Respiratory: Negative.     Endocrine: Negative.    Hematologic/Lymphatic: Negative.    Skin: Negative.    Musculoskeletal: Negative.    Gastrointestinal: Negative.    Genitourinary: Negative.    Neurological: Negative.    Psychiatric/Behavioral: Negative.     Allergic/Immunologic: Negative.        Physical Exam    General Appearance: moderately overweight, no distress   Skin: warm, no ulcers or xanthomas; few ecchymoses   Eyes: conjunctivae and lids normal, pupils are equal and round   Neck Veins: normal JVP   Thyroid: no nodules, masses, tenderness or enlargement   Cardiovascular system: Pulse 86/min, regular rhythm , normal volume, no specific character, felt equally in all peripheries and there was no radio femoral delay.  PMI undisplaced, no palpable thrills/heaves, S1 S2 heard, 2/4 diastolic murmur, 3/6 ejection systolic murmur in the aortic region, no rub, no carotid bruit.  Peripheral Circulation: normal peripheral circulation   Pedal Pulses: normal symmetric pedal pulses   Carotid Arteries: normal carotid upstroke bilaterally, no bruits   Respiratory system: No acute distress  No use of accessory muscles  Normal vesicular breath sounds over all the lung fields bilaterally  No added sounds        Cardiovascular Health Factors  Vitals BP Readings from Last 3 Encounters:   09/02/22 134/90   09/02/22 (!) 134/91   08/17/22 111/60     Wt Readings from Last 3 Encounters:   09/02/22 67.6 kg (149 lb)   09/02/22 67.6 kg (149 lb)   08/17/22 68  kg (150 lb)     BMI Readings from Last 3 Encounters:   09/02/22 24.79 kg/m?   09/02/22 24.79 kg/m?   08/17/22 24.96 kg/m?      Smoking Social History     Tobacco Use   Smoking Status Every Day    Current packs/day: 0.50    Average packs/day: 0.7 packs/day for 57.8 years (38.5 ttl pk-yrs)    Types: Cigarettes    Start date: 1986    Passive exposure: Current   Smokeless Tobacco Never      Lipid Profile Cholesterol   Date Value Ref Range Status   07/09/2021 120 <200 MG/DL Final     HDL   Date Value Ref Range Status   07/09/2021 39 (L) >40 MG/DL Final     LDL   Date Value Ref Range Status   07/09/2021 74 <100 mg/dL Final     Triglycerides   Date Value Ref Range Status   07/09/2021 139 <150 MG/DL Final      Blood Sugar Hemoglobin A1C   Date Value Ref Range Status   02/06/2021 5.4 4.0 - 6.0 % Final     Comment:     The ADA recommends that most patients with type 1 and type 2 diabetes maintain   an A1c level <7%.       Glucose   Date Value Ref Range Status   05/07/2022 86 70 - 100 MG/DL Final   21/30/8657 98 70 - 100 MG/DL Final   84/69/6295 97 70 - 100 MG/DL Final   28/41/3244 92 70 - 110 MG/DL Final   01/20/7251 664 70 - 110 MG/DL Final     Glucose, POC   Date Value Ref Range Status   06/30/2017 104 (H) 70 - 100 MG/DL Final   40/34/7425 92 70 - 100 MG/DL Final          Current Medications (including today's revisions)   abacavir-dolutegravir-lamivud (TRIUMEQ) 600-50-300 mg tablet Take 1 Tablet by mouth once daily    aspirin 81 mg chewable tablet Chew and swallow 1 Tablet by mouth daily.    atorvastatin (LIPITOR) 10 mg tablet Take one tablet by mouth daily.    docosahexaenoic acid/epa (FISH OIL PO) Take  by mouth daily.    metoprolol succinate XL (TOPROL XL) 25 mg extended release tablet Take 1 Tablet by mouth daily.    sildenafiL (VIAGRA) 50 mg tablet Take one tablet by mouth as Needed for Erectile dysfunction.    valACYclovir (VALTREX) 1 gram tablet Take two tablets by mouth twice daily.

## 2022-09-02 NOTE — Telephone Encounter
Insurance sent the denial to [place]. Will have paperwork scanned in.

## 2022-09-03 ENCOUNTER — Encounter: Admit: 2022-09-03 | Discharge: 2022-09-03 | Payer: No Typology Code available for payment source

## 2022-09-03 MED ORDER — SILDENAFIL 50 MG PO TAB
50 mg | ORAL_TABLET | ORAL | 2 refills | 45.00000 days | Status: AC | PRN
Start: 2022-09-03 — End: ?

## 2022-09-03 NOTE — Telephone Encounter
Pt states he always uses the good rx card.

## 2022-09-03 NOTE — Telephone Encounter
Asked to have sildenafil sent to [facility] instead of [facility]. Resent to [facility].

## 2022-09-07 ENCOUNTER — Encounter: Admit: 2022-09-07 | Discharge: 2022-09-07 | Payer: No Typology Code available for payment source

## 2022-09-07 DIAGNOSIS — C4490 Unspecified malignant neoplasm of skin, unspecified: Secondary | ICD-10-CM

## 2022-09-07 DIAGNOSIS — L0390 Cellulitis, unspecified: Secondary | ICD-10-CM

## 2022-09-07 DIAGNOSIS — L02414 Cutaneous abscess of left upper limb: Secondary | ICD-10-CM

## 2022-09-07 DIAGNOSIS — M1990 Unspecified osteoarthritis, unspecified site: Secondary | ICD-10-CM

## 2022-09-07 DIAGNOSIS — L03317 Cellulitis of buttock: Secondary | ICD-10-CM

## 2022-09-07 DIAGNOSIS — S129XXA Fracture of neck, unspecified, initial encounter: Secondary | ICD-10-CM

## 2022-09-07 DIAGNOSIS — C439 Malignant melanoma of skin, unspecified: Secondary | ICD-10-CM

## 2022-09-07 DIAGNOSIS — R011 Cardiac murmur, unspecified: Secondary | ICD-10-CM

## 2022-09-07 DIAGNOSIS — R000 Tachycardia, unspecified: Secondary | ICD-10-CM

## 2022-09-07 DIAGNOSIS — T1491XA Suicide attempt, initial encounter: Secondary | ICD-10-CM

## 2022-09-07 DIAGNOSIS — B20 Human immunodeficiency virus [HIV] disease: Secondary | ICD-10-CM

## 2022-09-07 DIAGNOSIS — J189 Pneumonia, unspecified organism: Secondary | ICD-10-CM

## 2022-09-07 DIAGNOSIS — L409 Psoriasis, unspecified: Secondary | ICD-10-CM

## 2022-09-07 DIAGNOSIS — F32A Depression: Secondary | ICD-10-CM

## 2022-09-07 DIAGNOSIS — C801 Malignant (primary) neoplasm, unspecified: Secondary | ICD-10-CM

## 2022-09-07 DIAGNOSIS — I38 Endocarditis, valve unspecified: Secondary | ICD-10-CM

## 2022-09-07 DIAGNOSIS — J439 Emphysema, unspecified: Secondary | ICD-10-CM

## 2022-09-07 DIAGNOSIS — L03116 Cellulitis of left lower limb: Secondary | ICD-10-CM

## 2022-09-07 DIAGNOSIS — K5792 Diverticulitis of intestine, part unspecified, without perforation or abscess without bleeding: Secondary | ICD-10-CM

## 2022-09-07 DIAGNOSIS — R42 Dizziness and giddiness: Secondary | ICD-10-CM

## 2022-09-07 DIAGNOSIS — A419 Sepsis, unspecified organism: Secondary | ICD-10-CM

## 2022-09-07 DIAGNOSIS — E785 Hyperlipidemia, unspecified: Secondary | ICD-10-CM

## 2022-09-07 DIAGNOSIS — K219 Gastro-esophageal reflux disease without esophagitis: Secondary | ICD-10-CM

## 2022-09-07 DIAGNOSIS — A64 Unspecified sexually transmitted disease: Secondary | ICD-10-CM

## 2022-09-10 ENCOUNTER — Encounter: Admit: 2022-09-10 | Discharge: 2022-09-10 | Payer: No Typology Code available for payment source

## 2022-09-13 ENCOUNTER — Ambulatory Visit: Admit: 2022-09-13 | Discharge: 2022-09-13 | Payer: No Typology Code available for payment source

## 2022-09-13 ENCOUNTER — Encounter: Admit: 2022-09-13 | Discharge: 2022-09-13 | Payer: No Typology Code available for payment source

## 2022-09-13 DIAGNOSIS — Z0283 Encounter for blood-alcohol and blood-drug test: Secondary | ICD-10-CM

## 2022-09-14 ENCOUNTER — Encounter: Admit: 2022-09-14 | Discharge: 2022-09-14 | Payer: No Typology Code available for payment source

## 2022-09-14 DIAGNOSIS — R931 Abnormal findings on diagnostic imaging of heart and coronary circulation: Secondary | ICD-10-CM

## 2022-09-14 DIAGNOSIS — E782 Mixed hyperlipidemia: Secondary | ICD-10-CM

## 2022-09-14 DIAGNOSIS — I359 Nonrheumatic aortic valve disorder, unspecified: Secondary | ICD-10-CM

## 2022-09-14 MED ORDER — ATORVASTATIN 10 MG PO TAB
10 mg | ORAL_TABLET | Freq: Every day | ORAL | 0 refills | Status: AC
Start: 2022-09-14 — End: ?

## 2022-09-17 NOTE — Progress Notes
Date of Service: 09/21/2022    Charles Walls is a 52 y.o. male here for an annual physical.  Chief Complaint   Patient presents with    Physical    Labs Only     Not Fasting    Procedure     Lung Screening      Imm/Inj     Shingles       Subjective:               History of Present Illness  DDS visits: Biannually.  Eye exams: Annually.  Exercise: Does a lot of yard work  Diet: Regular    Patient has the following concerns today: NONE    Chronic conditions:     Renal carcinoma: Diagnosed in 2019. He underwent a partial nephrectomy. No radiation or chemotherapy indicated.     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 Released from oncology but recommended continued annual SPEP, immunofixation, and free light chain ratio annually.      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 He is asymptomatic. Has some SOA thought to be related to cardiac as albuterol was not helpful.     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 Previously seen by [facility] in 2022. He has been sober since January 2023.    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 Started on furosemide to help with chronic dyspnea. Also on metoprolol.     Hyperlipidemia Management: Takes atorvastatin 10 mg daily.  LDL goal < 70.   Diet adherence:most of the time   Medication adherence:all of the time   Side effects to medications? No  Lab Results   Component Value Date/Time    CHOL 120 07/09/2021 04:04 PM    TRIG 139 07/09/2021 04:04 PM    HDL 39 (L) 07/09/2021 04:04 PM    LDL 74 07/09/2021 04:04 PM    VLDL 28 07/09/2021 04:04 PM    NONHDLCHOL 81 07/09/2021 04:04 PM    ALT 13 05/07/2022 10:32 AM    VITD25 26.4 (L) 03/21/2019 03:20 PM        Imp: Hyperlipidemia good     Plan:  Discussed labs and reviewed goals for LDL, HDL, triglycerides.   Discussed exercise management and diet with emphasis on vegetables, fruit and lean meat.  Are barriers to achieving goals present? No  Medication education provided. Patient voiced understanding? Yes  Patient able to self-manage and ready to comply?Yes  Educational resources identified? Verbal Counseling       Past Medical History:   Diagnosis Date    Abscess of skin of left wrist 02/05/2021    Arthritis N/A    Cancer (HCC) 01/19    Cellulitis 05/31/2021    Cellulitis of buttock, left 05/31/2021    Cellulitis of left lower extremity 07/02/2020    Depression     Diverticulitis 02/02/2017    Dizziness Years  ago    Fracture cervical vertebra-closed (HCC)     c6, c7 - no surgical repair    Gastroesophageal reflux disease without esophagitis 10/05/2019    Ordered omeprazole 40mg  qday before breakfast.  Discussed lifestyle modifications. No current red flag symptoms.  Hx of PUD in high school    Heart murmur     Human immunodeficiency virus (HIV) disease (HCC)     Hyperlipidemia 06/13/2013    Melanoma (  HCC)     Pneumonia     Psoriasis, unspecified 02/21/2014    Pulmonary emphysema (HCC) Dk    Sepsis (HCC) 03/20/2019    Sexually transmitted disease ???    Skin cancer     Suicide attempt [facility])     Tachyarrhythmia June 2019    Valvular heart disease 2019     Surgical History:   Procedure Laterality Date    COLONOSCOPY N/A 06/17/2015    Performed by Jolee Ewing, MD at [facility] ENDO    ROBOT ASSISTED LAPAROSCOPIC NEPHRECTOMY PARTIAL WITH INTRAOPERATIVE ULTRASOUND  Left 06/09/2017    Performed by Daryl Eastern, MD at [facility] OR    BIOPSY/ EXCISION LYMPH NODE DEEP CERVICAL Left 08/11/2020    Performed by Alpha Gula, MD at [facility] OR    INCISION AND DRAINAGE ISCHIORECTAL/ PERIRECTAL ABSCESS N/A 06/02/2021    Performed by Guinevere Scarlet, MD at [facility] OR    HERNIA REPAIR      HX APPENDECTOMY      PR LAPAROSCOPY SURG RPR INITIAL INGUINAL HERNIA      right     Family History   Problem Relation Name Age of Onset    Melanoma Father Dad     Cancer Father Dad         oral mandibular    High Cholesterol Mother Mom     Cancer-Colon Maternal Aunt  3    Depression Sister Susy Frizzle     Diabetes Neg Hx      Hypertension Neg Hx      Cancer-Breast Neg Hx      Cancer-Ovarian Neg Hx      Cancer-Prostate Neg Hx       Social History     Socioeconomic History    Marital status: Divorced    Number of children: 0   Occupational History    Occupation: unemployed   Tobacco Use    Smoking status: Every Day     Current packs/day: 0.50     Average packs/day: 0.7 packs/day for 57.9 years (38.5 ttl pk-yrs)     Types: Cigarettes     Start date: 1986     Passive exposure: Current    Smokeless tobacco: Never    Tobacco comments:     Cutting down slowly.    Vaping Use    Vaping status: Never Used   Substance and Sexual Activity    Alcohol use: Not Currently     Comment: Sober since 2018    Drug use: Not Currently     Types: Methamphetamines     Comment: Last used meth in January 2023    Sexual activity: Yes     Partners: Male     Birth control/protection: Condom   Social History Narrative    Pagan       Allergies   Allergen Reactions    Sulfa (Sulfonamide Antibiotics) SEE COMMENTS     Patient reports that his father and brother are allergic, he is unsure so he reports it as an allergy.             Review of Systems   Constitutional:  Negative for chills, fatigue, fever and unexpected weight change.   HENT:  Negative for congestion, dental problem, ear discharge, ear pain, hearing loss, nosebleeds, rhinorrhea, sore throat and trouble swallowing.    Eyes:  Negative for pain, discharge, redness and visual disturbance.   Respiratory:  Positive for shortness of breath (Chronic- working with cardiology on this). Negative for cough,  chest tightness and wheezing.    Cardiovascular:  Negative for chest pain, palpitations and leg swelling.   Gastrointestinal:  Negative for abdominal pain, blood in stool, constipation, diarrhea, nausea and vomiting.   Endocrine: Negative for polydipsia, polyphagia and polyuria. Genitourinary:  Negative for difficulty urinating, dysuria, frequency, hematuria, scrotal swelling, testicular pain and urgency.   Musculoskeletal:  Positive for back pain (Chronic, intermittent sciatica- improves with stretch). Negative for arthralgias, myalgias, neck pain and neck stiffness.   Skin:  Negative for rash.   Allergic/Immunologic: Negative for immunocompromised state.   Neurological:  Negative for dizziness, tremors, syncope, speech difficulty, weakness, light-headedness, numbness and headaches.   Hematological:  Negative for adenopathy. Does not bruise/bleed easily.   Psychiatric/Behavioral:  Negative for dysphoric mood, self-injury, sleep disturbance and suicidal ideas. The patient is not nervous/anxious.    All other systems reviewed and are negative.        Objective:          abacavir-dolutegravir-lamivud (TRIUMEQ) 600-50-300 mg tablet Take 1 Tablet by mouth once daily    aspirin 81 mg chewable tablet Chew and swallow 1 Tablet by mouth daily.    atorvastatin (LIPITOR) 10 mg tablet Take 1 Tablet by mouth daily    furosemide (LASIX) 40 mg tablet Take one tablet by mouth every morning.    metoprolol succinate XL (TOPROL XL) 25 mg extended release tablet Take 1 Tablet by mouth daily.    potassium chloride SR (KLOR-CON M20) 20 mEq tablet Take one tablet by mouth daily. Take with a meal and a full glass of water.    sildenafiL (VIAGRA) 50 mg tablet Take one tablet by mouth as Needed for Erectile dysfunction.    valACYclovir (VALTREX) 1 gram tablet Take two tablets by mouth twice daily.     Medications Discontinued During This Encounter   Medication Reason    docosahexaenoic acid/epa (FISH OIL PO)        Vitals:    09/21/22 1448   BP: 120/76   Pulse: 91   Temp: 36.4 ?C (97.5 ?F)   SpO2: 95%   TempSrc: Temporal   PainSc: Six   Weight: 66.7 kg (147 lb)   Height: 163.5 cm (5' 4.37)     Body mass index is 24.94 kg/m?Marland Kitchen     Physical Exam  Vitals and nursing note reviewed.   Constitutional:       General: He is awake.      Appearance: Normal appearance. He is well-groomed.   HENT:      Head: Normocephalic and atraumatic.      Right Ear: Tympanic membrane, ear canal and external ear normal.      Left Ear: Tympanic membrane, ear canal and external ear normal.      Nose: Nose normal. No congestion or rhinorrhea.      Mouth/Throat:      Mouth: Mucous membranes are moist.      Pharynx: Oropharynx is clear. No oropharyngeal exudate or posterior oropharyngeal erythema.   Eyes:      General: Lids are normal. Lids are everted, no foreign bodies appreciated. No visual field deficit.        Right eye: No discharge.         Left eye: No discharge.      Extraocular Movements: Extraocular movements intact.      Conjunctiva/sclera: Conjunctivae normal.      Right eye: Right conjunctiva is not injected. No exudate or hemorrhage.     Left eye: Left conjunctiva is not  injected. No exudate or hemorrhage.     Pupils: Pupils are equal, round, and reactive to light.   Neck:      Thyroid: No thyroid mass, thyromegaly or thyroid tenderness.      Vascular: No carotid bruit or JVD.   Cardiovascular:      Rate and Rhythm: Normal rate and regular rhythm.      Heart sounds: Normal heart sounds, S1 normal and S2 normal. Heart sounds not distant. No murmur heard.  Pulmonary:      Effort: Pulmonary effort is normal. No respiratory distress.      Breath sounds: Normal breath sounds. No stridor. No wheezing, rhonchi or rales.   Chest:      Chest wall: No deformity.   Abdominal:      General: Abdomen is flat. Bowel sounds are normal. There is no distension.      Palpations: Abdomen is soft. There is no mass.      Tenderness: There is no abdominal tenderness. There is no guarding.      Hernia: No hernia is present.   Musculoskeletal:         General: No deformity or signs of injury. Normal range of motion.      Cervical back: Normal range of motion and neck supple. No muscular tenderness.      Right lower leg: No edema.      Left lower leg: No edema. Lymphadenopathy:      Cervical: No cervical adenopathy.      Upper Body:      Right upper body: No supraclavicular adenopathy.      Left upper body: No supraclavicular adenopathy.   Skin:     General: Skin is warm and dry.      Capillary Refill: Capillary refill takes less than 2 seconds.      Findings: No erythema, lesion or rash.   Neurological:      General: No focal deficit present.      Mental Status: He is alert and oriented to person, place, and time.      Cranial Nerves: No cranial nerve deficit.      Sensory: No sensory deficit.      Motor: No weakness.      Coordination: Coordination normal.      Gait: Gait normal.      Deep Tendon Reflexes: Reflexes normal.   Psychiatric:         Mood and Affect: Mood normal.         Behavior: Behavior normal. Behavior is cooperative.         Thought Content: Thought content normal.         Judgment: Judgment normal.         Depression:  Patient Scores:  PHQ-2: PHQ-2 Score: 0 (09/02/2022  1:42 PM)    PHQ-9: PHQ-9 Score: 3 (06/19/2022  3:38 PM)    Interventions:  PHQ-2: PHQ-2 Score less than 3: No follow-up or recommendations are necessary at this time (09/02/2022  1:42 PM)    Depression Interventions PHQ-2/9: No data recorded    BMI:  Body mass index is 24.94 kg/m?Marland Kitchen  No Follow-Up: No follow-up action or recommendations are necessary at this time (01/19/2022 11:45 AM)      Falls:  Fall History (last 46mo): No Falls (09/21/2022  2:49 PM)  Fall Risk: None identified (09/21/2022  2:49 PM)      No results found for this or any previous visit (from the past 336 hour(s)).    Health  Maintenance Summary    -      HEPATITIS C SCREENING  Completed    02/06/2021  Anti HCV component of HEPATITIS C ANTIBODY W REFLEX HCV PCR   QUANT    08/27/2020  Anti HCV component of HEPATITIS C ANTIBODY W REFLEX HCV PCR   QUANT    03/21/2019  Anti HCV component of HEPATITIS C ANTIBODY W REFLEX HCV PCR   QUANT    07/29/2016  Anti HCV component of HEPATITIS C AB    07/31/2013  Anti HCV component of HEPATITIS C AB    Only the first 5 history entries have been loaded, but more history   exists.          Discontinued - COLORECTAL CANCER SCREENING  Discontinued      Frequency changed to Never automatically (Topic No Longer Applies)    06/17/2015  COLONOSCOPY    06/17/2015  Surgical Procedure: COLONOSCOPY                Assessment and Plan:  Problem   Other Stimulant Dependence, Uncomplicated (Hcc) (Resolved)       [name] was seen today for physical, labs only, procedure and imm/inj.    Diagnoses and all orders for this visit:    Routine general medical examination at a health care facility  -     CBC AND DIFF; Future; Expected date: 09/21/2022  -     COMPREHENSIVE METABOLIC PANEL; Future; Expected date: 09/21/2022  -     LIPID PROFILE; Future; Expected date: 09/21/2022    RTC for fasting lab.  Reviewed the importance of healthy diet and regular exercise routine.   Encouraged the continued participation in preventative medicine for the early detection of disease and improved health.   Provided education (see patient AVS)  regarding: Annual eye and dental exams, daily exercise, healthy diet, mole monitoring/skin checks, avoidance of tobacco products, and limiting alcohol.   Immunizations recommended today: Shingrix (given)  Health Maintenance items reviewed. Patient understands she is due for: Lung cancer screening    Mixed hyperlipidemia  -     COMPREHENSIVE METABOLIC PANEL; Future; Expected date: 09/21/2022  -     LIPID PROFILE; Future; Expected date: 09/21/2022  Aortic calcification (HCC)  Chronic. Takes atorvastatin 10 mg daily.   Recheck lab.    Renal cell carcinoma of left kidney (HCC)  Renal cell cancer, left (HCC)  In remission.     Other primary thrombophilia (HCC)  Lymphoma of lymph nodes of neck, unspecified lymphoma type (HCC)  Chronic. Continue to trend SPEP, immunofixation, and free light chain annually.    HIV infection, unspecified symptom status (HCC)  Immunodeficiency, unspecified (HCC)  Chronic. Continue to follow with ID.    MDD (major depressive disorder), recurrent, in full remission (HCC)  Chronic. Mood remains stable off medication.    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    Pulmonary emphysema, unspecified emphysema type (HCC)  Noted on CT. Asymptomatic.     Screening for malignant neoplasm of prostate  -     PSA SCREEN; Future; Expected date: 09/21/2022  PSA with lab.    Other orders  -     ZOSTER VACCINE RECOM, ADJUVANT SUSPENSION COMPONENT (VIAL 1 OF 2)  -     ZOSTER VACCINE RECOMB, ADJ LYOPHILIZED COMPONENT (VIAL 2 OF 2)      Patient education provided regarding diagnosis, course, and treatment. Patient was in agreement to instructions,  POC, and will call if questions or concerns arise.     Total Time Today was 25 minutes in the following activities: Preparing to see the patient, Obtaining and/or reviewing separately obtained history, Performing a medically appropriate examination and/or evaluation, Counseling and educating the patient/family/caregiver, Ordering medications, tests, or procedures, Documenting clinical information in the electronic or other health record, and Independently interpreting results (not separately reported) and communicating results to the patient/family/caregiver      Return in about 1 year (around 09/21/2023) for physical exam.      No future appointments.

## 2022-09-21 ENCOUNTER — Encounter: Admit: 2022-09-21 | Discharge: 2022-09-21 | Payer: No Typology Code available for payment source

## 2022-09-21 ENCOUNTER — Ambulatory Visit: Admit: 2022-09-21 | Discharge: 2022-09-22 | Payer: No Typology Code available for payment source

## 2022-09-21 ENCOUNTER — Ambulatory Visit: Admit: 2022-09-21 | Discharge: 2022-09-21 | Payer: No Typology Code available for payment source

## 2022-09-21 DIAGNOSIS — R000 Tachycardia, unspecified: Secondary | ICD-10-CM

## 2022-09-21 DIAGNOSIS — R011 Cardiac murmur, unspecified: Secondary | ICD-10-CM

## 2022-09-21 DIAGNOSIS — C801 Malignant (primary) neoplasm, unspecified: Secondary | ICD-10-CM

## 2022-09-21 DIAGNOSIS — K219 Gastro-esophageal reflux disease without esophagitis: Secondary | ICD-10-CM

## 2022-09-21 DIAGNOSIS — D6859 Other primary thrombophilia: Secondary | ICD-10-CM

## 2022-09-21 DIAGNOSIS — E785 Hyperlipidemia, unspecified: Secondary | ICD-10-CM

## 2022-09-21 DIAGNOSIS — B20 Human immunodeficiency virus [HIV] disease: Secondary | ICD-10-CM

## 2022-09-21 DIAGNOSIS — I38 Endocarditis, valve unspecified: Secondary | ICD-10-CM

## 2022-09-21 DIAGNOSIS — J439 Emphysema, unspecified: Secondary | ICD-10-CM

## 2022-09-21 DIAGNOSIS — D849 Immunodeficiency, unspecified: Secondary | ICD-10-CM

## 2022-09-21 DIAGNOSIS — C4490 Unspecified malignant neoplasm of skin, unspecified: Secondary | ICD-10-CM

## 2022-09-21 DIAGNOSIS — F32A Depression: Secondary | ICD-10-CM

## 2022-09-21 DIAGNOSIS — C439 Malignant melanoma of skin, unspecified: Secondary | ICD-10-CM

## 2022-09-21 DIAGNOSIS — F1520 Other stimulant dependence, uncomplicated: Secondary | ICD-10-CM

## 2022-09-21 DIAGNOSIS — A419 Sepsis, unspecified organism: Secondary | ICD-10-CM

## 2022-09-21 DIAGNOSIS — L02414 Cutaneous abscess of left upper limb: Secondary | ICD-10-CM

## 2022-09-21 DIAGNOSIS — L03317 Cellulitis of buttock: Secondary | ICD-10-CM

## 2022-09-21 DIAGNOSIS — A64 Unspecified sexually transmitted disease: Secondary | ICD-10-CM

## 2022-09-21 DIAGNOSIS — C642 Malignant neoplasm of left kidney, except renal pelvis: Secondary | ICD-10-CM

## 2022-09-21 DIAGNOSIS — I700 Atherosclerosis of aorta: Secondary | ICD-10-CM

## 2022-09-21 DIAGNOSIS — L0390 Cellulitis, unspecified: Secondary | ICD-10-CM

## 2022-09-21 DIAGNOSIS — K5792 Diverticulitis of intestine, part unspecified, without perforation or abscess without bleeding: Secondary | ICD-10-CM

## 2022-09-21 DIAGNOSIS — F1510 Other stimulant abuse, uncomplicated: Secondary | ICD-10-CM

## 2022-09-21 DIAGNOSIS — S129XXA Fracture of neck, unspecified, initial encounter: Secondary | ICD-10-CM

## 2022-09-21 DIAGNOSIS — F3342 Major depressive disorder, recurrent, in full remission: Secondary | ICD-10-CM

## 2022-09-21 DIAGNOSIS — R42 Dizziness and giddiness: Secondary | ICD-10-CM

## 2022-09-21 DIAGNOSIS — J189 Pneumonia, unspecified organism: Secondary | ICD-10-CM

## 2022-09-21 DIAGNOSIS — T1491XA Suicide attempt, initial encounter: Secondary | ICD-10-CM

## 2022-09-21 DIAGNOSIS — L03116 Cellulitis of left lower limb: Secondary | ICD-10-CM

## 2022-09-21 DIAGNOSIS — L409 Psoriasis, unspecified: Secondary | ICD-10-CM

## 2022-09-21 DIAGNOSIS — M1990 Unspecified osteoarthritis, unspecified site: Secondary | ICD-10-CM

## 2022-09-21 NOTE — Patient Instructions
It was a pleasure to see you today and take part in your care.     Notes from today's visit:  Please see attached documents for tips on mole monitoring, preventative care screenings, and additional information about living your healthiest life!  Imaging: You can call to schedule your LUNG CANCER SCREENING at [phone].  Labs: Your labs will be available for you to view on MyChart as early as 8 hours from the time they are drawn. Please allow me 2-3 business days to review all results and post a results message to you. I typically wait until all results are back before reviewing. If anything is critical, I will be promptly paged and you will be notified. I appreciate your patience and understanding!    Patient satisfaction is essential to our success. You may be contacted by the Medical Practice Survey in the coming days for a survey and your feedback is very important to [place]. Feedback helps [place] identify our strengths and weaknesses in relationship to the goals we have set for ourselves. Responses of VERY GOOD (5) are viewed as positive. If you do not feel we reached this level of service please share your concerns with myself and/or our Engineer, manufacturing.      *If you had to wait on me today, please accept my sincerest apologies. I know your time is very valuable and I want to honor it. It always my best intention to run on time, however on occasion, I do run behind due to unexpected issues that arise with patients.     Have a delightful day,   Charles Stamps, APRN        Prevention Guidelines, Men Ages 31 to 57  Screening tests and vaccines are an important part of managing your health. A screening test is done to find diseases in people who don't have any symptoms. The goal is to find a disease early so lifestyle changes and checkups can reduce the risk of disease. Or the goal may be to detect it early to treat it most effectively. Screening tests are not used to diagnose a disease. But they are used to see if more testing is needed. Health counseling is important, too. Below are guidelines for these, for men ages 72 to 60. Keep in mind that screening recommendations vary among expert groups. Talk with your healthcare provider about which tests are best for you and to make sure you?re up-to-date on what you need.   Screening  Who needs it  How often    Unhealthy alcohol use  All men in this age group  At routine exams   Blood pressure All men in this age group  Yearly checkup if your blood pressure is normal   Normal blood pressure is 120/80 mm Hg   If your blood pressure reading is higher than normal, follow the advice of your healthcare provider    Colorectal cancer All men at average risk in this age group  Multiple tests are available and are used at different times. Possible tests include:   Colonoscopy every 10 years, or  Flexible sigmoidoscopy every 5 years (or every 10 years with yearly FIT stool test), or  CT colonography (virtual colonoscopy) every 5 years, or  Yearly fecal occult blood test, or  Yearly stool fecal immunochemical test (FIT) , or  Stool DNA test, every 1 to 3 years  If you choose a test other than a colonoscopy and have an abnormal test result, you will need to follow-up with a  colonoscopy. Talk with your healthcare provider about which tests are best for you.   Some people should be screened using a different schedule because of their personal or family health history. Talk with your healthcare provider about your health history.    Depression All men in this age group  At routine exams   Type 2 diabetes or prediabetes  All men beginning at age 79 and men without symptoms at any age who are overweight or obese and have 1 or more other risk factors for diabetes  At least every 3 years (yearly if your blood sugar has already begun to rise)    Type 2 diabetes All men with prediabetes  Every year   Hepatitis C Men at increased risk for infection; 1 time for those born between 1945 and 1965  At routine exams; talk with your healthcare provider.    High cholesterol or triglycerides  All men in this age group  At least every 4 to 6 years; talk with your healthcare provider about your risk and whether it should be tested more often    HIV All males up to age 53 and men at increased risk.  At least once up to age 8 at routine exams; talk with your healthcare provider if you are at risk    Lung cancer Men between ages 88 and 77 who are in fairly good health and are at higher risk for lung cancer:   Currently smoke or who have quit within past 15 years  20-pack year smoking history, or 1 pack/day for 20 years or 2 packs/day for 10 years  Expert groups vary a bit in their recommendations so talk with your provider  Yearly lung cancer screening with a low-dose CT scan (LDCT); talk with your healthcare provider    Obesity All men in this age group  At yearly routine exams    BMI (body mass index) All men in this age group Every year, to help find out if you are at a healthy weight for your height    Prostate cancer Starting at age 75, talk with your healthcare provider about risks and benefits of testing with digital rectal exam (DRE) and prostate-specific antigen (PSA) screening  At routine exams if you decide to be tested.    Syphilis Men at increased risk for infection  At routine exams; talk with your healthcare provider    Tuberculosis Men at increased risk for infection  Talk with your healthcare provider    Vision All men in this age group  Talk with your healthcare provider   Vaccine  Who needs it  How often    Chickenpox (varicella)  All men in this age group who have no record of this infection or vaccine  2 doses; second dose should be given at least 4 weeks after the first dose    Hepatitis A Men at increased risk for infection  2 or 3 doses (depending on the vaccine) given at least 6 months apart; talk with your healthcare provider    Hepatitis B Men at increased risk for infection  2 or 3 doses (depending on the vaccine) second dose should be given 1 month after the first dose; if a third dose , it should be given at least 2 months after the second dose and at least 4 months after the first dose; talk with your healthcare provider    Haemophilus influenzae Type B (HIB)  Men at increased risk for infection  1 or 3  doses; talk with your healthcare provider    Influenza (flu) All men in this age group  Once a year   COVID-19 All men in this age group  1 to 2 doses depending on vaccine; talk with your healthcare provider    Measles, mumps, rubella (MMR)  Men in this age group born in [birth year] or later who have no record of these infections or vaccines  1 or 2 doses; talk with your healthcare provider    Meningococcal ACWY (MenACWY)  Men at increased risk for infection  1 or 2 doses depending on your case. Then a booster every 5 years if you are still at risk. Talk with your healthcare provider.    Meningococcal B (MenB) Men at increased risk for infection 2 or 3 doses, depending on the vaccine and your case; talk with your healthcare provider    Pneumococcal conjugate vaccine (PCV13) and pneumococcal polysaccharide vaccine (PPSV23)  Men at increased risk for infection  PCV13: 1 dose ages 9 to 14 (protects against 13 types of pneumococcal bacteria)   PPSV23: 1 to 2 doses through age 43, (protects against 23 types of pneumococcal bacteria)    Tetanus/diphtheria/pertussis (Td/Tdap) booster All men in this age group  1 dose if Tdap, then Td or Tdap booster every 10 years    Recombinant zoster vaccine (RZV) (Shingrix)  All men in this age group  2 doses; the 2nd dose is given 2 to 6 months after the first. This is given even if you've had shingles before, not sure if you have had chickenpox, or had a previous zoster live vaccine    Counseling  Who needs it  How often    Diet and exercise Men who are overweight or obese  When diagnosed, and then at routine exams    Sexually transmitted infection prevention  Men at increased risk for infection  At routine exams; talk with your healthcare provider    Use of daily aspirin  Men who have a history of heart attack or stroke may be prescribed daily aspirin. Experts vary on their recommendations on whether it should be given routinely  At routine exams; talk with your healthcare provider about the risks vs. benefits for you    Use of statin medicines for cholesterol Men between the ages of 42 and 17 years who have   An LDL-C level of more than 70 mg/dL but less than 106 mg/dL, no diabetes and borderline to high level of risk  An LDL-C level of 190 mg/dL or greater  A diagnosis of diabetes and LDL-C level of greater than 70mg /dL At routine exams, or more often as directed by your healthcare provider. Statin dosages may vary based on your overall health, risk factors, and other health conditions such as diabetes. Talk with your healthcare provider about your risk .    Use of tobacco and the health effects it can cause  All men in this age group  Every exam   [employer] last reviewed this educational content on 05/19/2019  ? 2000-2021 [employer] [place]. All rights reserved. This information is not intended as a substitute for professional medical care. Always follow your healthcare professional's instructions.

## 2022-09-22 DIAGNOSIS — Z125 Encounter for screening for malignant neoplasm of prostate: Secondary | ICD-10-CM

## 2022-09-22 DIAGNOSIS — E782 Mixed hyperlipidemia: Secondary | ICD-10-CM

## 2022-09-22 DIAGNOSIS — Z0000 Encounter for general adult medical examination without abnormal findings: Secondary | ICD-10-CM

## 2022-09-22 DIAGNOSIS — C8591 Non-Hodgkin lymphoma, unspecified, lymph nodes of head, face, and neck: Secondary | ICD-10-CM

## 2022-09-24 ENCOUNTER — Encounter: Admit: 2022-09-24 | Discharge: 2022-09-24 | Payer: No Typology Code available for payment source

## 2022-09-24 DIAGNOSIS — Z720 Tobacco use: Secondary | ICD-10-CM

## 2022-09-29 ENCOUNTER — Encounter: Admit: 2022-09-29 | Discharge: 2022-09-29 | Payer: No Typology Code available for payment source

## 2022-10-04 ENCOUNTER — Encounter: Admit: 2022-10-04 | Discharge: 2022-10-04 | Payer: No Typology Code available for payment source

## 2022-10-04 NOTE — Telephone Encounter
Charles Walls called and stated that he has been having wheezing, shortness of breath, coughing and sneezing that started 3 days ago. He states that it is constant. He also has chest congestion,dizziness, runny nose, cough, chest discomfort and fever. He has tested twice for covid and negative. Unable to schedule in our office due to lateness of the day. Advised to go to an urgent care to be evaluated. Charles Walls verbalized understanding.     Reason for Disposition   MILD difficulty breathing (e.g., minimal/no SOB at rest, SOB with walking, pulse < 100) of new-onset or worse than normal    Answer Assessment - Initial Assessment Questions  1. RESPIRATORY STATUS: Describe your breathing? (e.g., wheezing, shortness of breath, unable to speak, severe coughing)       Wheezing, shortness of breath, coughing and sneezing    2. ONSET: When did this breathing problem begin?       3 days ago    3. PATTERN Does the difficult breathing come and go, or has it been constant since it started?       Constant    4. SEVERITY: How bad is your breathing? (e.g., mild, moderate, severe)     - MILD: No SOB at rest, mild SOB with walking, speaks normally in sentences, can lie down, no retractions, pulse < 100.     - MODERATE: SOB at rest, SOB with minimal exertion and prefers to sit, cannot lie down flat, speaks in phrases, mild retractions, audible wheezing, pulse 100-120.     - SEVERE: Very SOB at rest, speaks in single words, struggling to breathe, sitting hunched forward, retractions, pulse > 120       Mild- moderate    5. RECURRENT SYMPTOM: Have you had difficulty breathing before? If Yes, ask: When was the last time? and What happened that time?       No    6. CARDIAC HISTORY: Do you have any history of heart disease? (e.g., heart attack, angina, bypass surgery, angioplasty)       Bicuspid valve    7. LUNG HISTORY: Do you have any history of lung disease?  (e.g., pulmonary embolus, asthma, emphysema)      No    8. CAUSE: What do you think is causing the breathing problem?       Chest congestion 4 days ago    9. OTHER SYMPTOMS: Do you have any other symptoms? (e.g., dizziness, runny nose, cough, chest pain, fever)      Dizziness, runny nose, cough, chest discomfort, fever    10. O2 SATURATION MONITOR:  Do you use an oxygen saturation monitor (pulse oximeter) at home? If Yes, What is your reading (oxygen level) today? What is your usual oxygen saturation reading? (e.g., 95%)        No    11. PREGNANCY: Is there any chance you are pregnant? When was your last menstrual period?        N/a    12. TRAVEL: Have you traveled out of the country in the last month? (e.g., travel history, exposures)        No    Protocols used: Breathing Difficulty-A-[place]

## 2022-10-11 ENCOUNTER — Encounter: Admit: 2022-10-11 | Discharge: 2022-10-11 | Payer: No Typology Code available for payment source

## 2022-10-11 DIAGNOSIS — L409 Psoriasis, unspecified: Secondary | ICD-10-CM

## 2022-10-11 DIAGNOSIS — R000 Tachycardia, unspecified: Secondary | ICD-10-CM

## 2022-10-11 DIAGNOSIS — R42 Dizziness and giddiness: Secondary | ICD-10-CM

## 2022-10-11 DIAGNOSIS — L0390 Cellulitis, unspecified: Secondary | ICD-10-CM

## 2022-10-11 DIAGNOSIS — L03116 Cellulitis of left lower limb: Secondary | ICD-10-CM

## 2022-10-11 DIAGNOSIS — S129XXA Fracture of neck, unspecified, initial encounter: Secondary | ICD-10-CM

## 2022-10-11 DIAGNOSIS — J189 Pneumonia, unspecified organism: Secondary | ICD-10-CM

## 2022-10-11 DIAGNOSIS — R011 Cardiac murmur, unspecified: Secondary | ICD-10-CM

## 2022-10-11 DIAGNOSIS — M1990 Unspecified osteoarthritis, unspecified site: Secondary | ICD-10-CM

## 2022-10-11 DIAGNOSIS — K5792 Diverticulitis of intestine, part unspecified, without perforation or abscess without bleeding: Secondary | ICD-10-CM

## 2022-10-11 DIAGNOSIS — T1491XA Suicide attempt, initial encounter: Secondary | ICD-10-CM

## 2022-10-11 DIAGNOSIS — A419 Sepsis, unspecified organism: Secondary | ICD-10-CM

## 2022-10-11 DIAGNOSIS — A64 Unspecified sexually transmitted disease: Secondary | ICD-10-CM

## 2022-10-11 DIAGNOSIS — C439 Malignant melanoma of skin, unspecified: Secondary | ICD-10-CM

## 2022-10-11 DIAGNOSIS — E785 Hyperlipidemia, unspecified: Secondary | ICD-10-CM

## 2022-10-11 DIAGNOSIS — L02414 Cutaneous abscess of left upper limb: Secondary | ICD-10-CM

## 2022-10-11 DIAGNOSIS — C801 Malignant (primary) neoplasm, unspecified: Secondary | ICD-10-CM

## 2022-10-11 DIAGNOSIS — L03317 Cellulitis of buttock: Secondary | ICD-10-CM

## 2022-10-11 DIAGNOSIS — C4490 Unspecified malignant neoplasm of skin, unspecified: Secondary | ICD-10-CM

## 2022-10-11 DIAGNOSIS — K219 Gastro-esophageal reflux disease without esophagitis: Secondary | ICD-10-CM

## 2022-10-11 DIAGNOSIS — F32A Depression: Secondary | ICD-10-CM

## 2022-10-11 DIAGNOSIS — B20 Human immunodeficiency virus [HIV] disease: Secondary | ICD-10-CM

## 2022-10-11 DIAGNOSIS — J439 Emphysema, unspecified: Secondary | ICD-10-CM

## 2022-10-11 DIAGNOSIS — I38 Endocarditis, valve unspecified: Secondary | ICD-10-CM

## 2022-10-11 NOTE — Telephone Encounter
Patient referred for low-dose lung screening CT scan.    Contacted patient to complete screening for high -risk criteria as follows:      Patient contacted: Yes   The patient is currently: 52 y.o.    Current Smoker?  Yes     Smoking history: 38.57yrs x 1ppd    Pack years: 38.44yrs    Quit smoking within the past 15 years if not currently smoking?  No    Years since quit: na    Do you have any concerning symptoms:  Asymptomatic    Personal history of lung cancer or other malignancy: Yes     Prior CT CHEST within the past year?   Yes, Date 04/2022 performed at [facility]    If yes, images and report requested:  yes    High Risk Criteria:   Lung Screen met but deferring 04/2023    Referring Provider: Roma Kayser    Preferred location [facility] or [employer]): na

## 2022-12-27 ENCOUNTER — Encounter: Admit: 2022-12-27 | Discharge: 2022-12-27 | Payer: No Typology Code available for payment source

## 2022-12-27 ENCOUNTER — Encounter: Admit: 2022-12-27 | Discharge: 2022-12-27 | Payer: PRIVATE HEALTH INSURANCE

## 2022-12-27 DIAGNOSIS — I359 Nonrheumatic aortic valve disorder, unspecified: Secondary | ICD-10-CM

## 2022-12-27 DIAGNOSIS — E782 Mixed hyperlipidemia: Secondary | ICD-10-CM

## 2022-12-27 DIAGNOSIS — R931 Abnormal findings on diagnostic imaging of heart and coronary circulation: Secondary | ICD-10-CM

## 2022-12-27 MED ORDER — ATORVASTATIN 10 MG PO TAB
10 mg | ORAL_TABLET | Freq: Every day | ORAL | 0 refills | Status: AC
Start: 2022-12-27 — End: ?

## 2022-12-28 ENCOUNTER — Encounter: Admit: 2022-12-28 | Discharge: 2022-12-28 | Payer: No Typology Code available for payment source

## 2022-12-28 DIAGNOSIS — I358 Other nonrheumatic aortic valve disorders: Secondary | ICD-10-CM

## 2023-01-13 ENCOUNTER — Encounter: Admit: 2023-01-13 | Discharge: 2023-01-13 | Payer: No Typology Code available for payment source

## 2023-01-13 MED ORDER — SILDENAFIL 50 MG PO TAB
50 mg | ORAL_TABLET | ORAL | 2 refills | 45.00000 days | Status: AC | PRN
Start: 2023-01-13 — End: ?

## 2023-02-16 ENCOUNTER — Ambulatory Visit: Admit: 2023-02-16 | Discharge: 2023-02-17 | Payer: 59

## 2023-02-16 ENCOUNTER — Encounter: Admit: 2023-02-16 | Discharge: 2023-02-16 | Payer: PRIVATE HEALTH INSURANCE

## 2023-02-16 DIAGNOSIS — Z0283 Encounter for blood-alcohol and blood-drug test: Secondary | ICD-10-CM

## 2023-02-23 ENCOUNTER — Encounter: Admit: 2023-02-23 | Discharge: 2023-02-23 | Payer: No Typology Code available for payment source

## 2023-02-23 ENCOUNTER — Ambulatory Visit: Admit: 2023-02-23 | Discharge: 2023-02-24 | Payer: PRIVATE HEALTH INSURANCE

## 2023-02-23 ENCOUNTER — Encounter: Admit: 2023-02-23 | Discharge: 2023-02-23 | Payer: PRIVATE HEALTH INSURANCE

## 2023-02-23 DIAGNOSIS — B20 Human immunodeficiency virus [HIV] disease: Secondary | ICD-10-CM

## 2023-02-23 DIAGNOSIS — Z202 Contact with and (suspected) exposure to infections with a predominantly sexual mode of transmission: Secondary | ICD-10-CM

## 2023-02-23 DIAGNOSIS — A539 Syphilis, unspecified: Secondary | ICD-10-CM

## 2023-02-23 NOTE — Telephone Encounter
Dantavious called and stated that he is having sciatica pain since Monday. Rating it 7/10 . He had to miss work on Monday and needs a doctor note. Instructed that would need to be seen in the office. Advised to go to urgent care . Darious did not want to go and wants to see Aundra Millet in the office. Scheduled for Friday. Advised to go an urgent care/ER if pain worsens. Jevonte verbalized understanding.

## 2023-02-23 NOTE — Progress Notes
Orders entered per verbal order from Dr. Brandon Melnick.

## 2023-02-24 ENCOUNTER — Encounter: Admit: 2023-02-24 | Discharge: 2023-02-24 | Payer: No Typology Code available for payment source

## 2023-02-24 DIAGNOSIS — I359 Nonrheumatic aortic valve disorder, unspecified: Secondary | ICD-10-CM

## 2023-02-24 DIAGNOSIS — R931 Abnormal findings on diagnostic imaging of heart and coronary circulation: Secondary | ICD-10-CM

## 2023-02-24 DIAGNOSIS — E782 Mixed hyperlipidemia: Secondary | ICD-10-CM

## 2023-02-24 MED ORDER — ATORVASTATIN 10 MG PO TAB
ORAL_TABLET | 0 refills | Status: AC
Start: 2023-02-24 — End: ?

## 2023-02-24 NOTE — Telephone Encounter
 Labs done yesterday. Do you want to change his dose?

## 2023-02-25 ENCOUNTER — Encounter: Admit: 2023-02-25 | Discharge: 2023-02-25 | Payer: PRIVATE HEALTH INSURANCE

## 2023-02-25 ENCOUNTER — Ambulatory Visit: Admit: 2023-02-25 | Discharge: 2023-02-25 | Payer: 59

## 2023-02-25 DIAGNOSIS — Z604 Social exclusion and rejection: Secondary | ICD-10-CM

## 2023-02-25 DIAGNOSIS — C8591 Non-Hodgkin lymphoma, unspecified, lymph nodes of head, face, and neck: Secondary | ICD-10-CM

## 2023-02-25 DIAGNOSIS — M545 Acute exacerbation of chronic low back pain: Secondary | ICD-10-CM

## 2023-02-25 DIAGNOSIS — F1510 Other stimulant abuse, uncomplicated: Secondary | ICD-10-CM

## 2023-02-25 DIAGNOSIS — Z21 Asymptomatic human immunodeficiency virus [HIV] infection status: Secondary | ICD-10-CM

## 2023-02-25 DIAGNOSIS — Z591 Inadequate housing: Secondary | ICD-10-CM

## 2023-02-25 DIAGNOSIS — E782 Mixed hyperlipidemia: Secondary | ICD-10-CM

## 2023-02-25 DIAGNOSIS — J439 Emphysema, unspecified: Secondary | ICD-10-CM

## 2023-02-25 DIAGNOSIS — Z5941 Food insecurity: Secondary | ICD-10-CM

## 2023-02-25 DIAGNOSIS — I5032 Chronic diastolic (congestive) heart failure: Secondary | ICD-10-CM

## 2023-02-25 MED ORDER — CYCLOBENZAPRINE 5 MG PO TAB
ORAL_TABLET | ORAL | 0 refills | 30.00000 days | Status: AC
Start: 2023-02-25 — End: ?

## 2023-02-25 MED ORDER — ATORVASTATIN 20 MG PO TAB
20 mg | ORAL_TABLET | Freq: Every day | ORAL | 0 refills | Status: AC
Start: 2023-02-25 — End: ?

## 2023-02-25 MED ORDER — PREDNISONE 20 MG PO TAB
40 mg | ORAL_TABLET | Freq: Every day | ORAL | 0 refills | Status: AC
Start: 2023-02-25 — End: ?

## 2023-02-25 NOTE — Progress Notes
 Charles Walls is a 53 y.o. male.    Chief Complaint   Patient presents with    Patient Reported Other      sciatica pain 7/10, has been having since Monday, missed work. No injury    Follow Up     Lab work. He has questions about this.           Subjective:            HPI    Charles Walls presents to discuss the following:    Back pain:  Onset was 4 days ago. NKI.  States he has a history of intermittent sciatica issues. Last episode of this severity was 6 weeks ago.   States typically, exercises work to resolve severe flares of sciatica.   He has been taking acetaminophen with minimal improvement.   Working at a [employer], started on Monday. Feels this may have aggravated his pain.  Reports pain is localized to the left lower back. Radiates down through buttocks and into the ankle. Aggravated by extension of back.   Reports pain has improved since day of onset.   He has never seen a spine specialist in the past for this recurrent issue.          Review of Systems   Constitutional:  Negative for chills and fever.   Eyes:  Negative for visual disturbance.   Respiratory:  Negative for shortness of breath.    Cardiovascular:  Negative for chest pain and palpitations.   Musculoskeletal:  Positive for back pain.   Neurological:  Negative for dizziness, syncope, light-headedness and headaches.   Psychiatric/Behavioral: Negative.  Negative for suicidal ideas.    All other systems reviewed and are negative.          Your Current Medications:         Instructions    abacavir-dolutegravir-lamivud (TRIUMEQ) 600-50-300 mg tablet Take 1 Tablet by mouth once daily    aspirin 81 mg chewable tablet Chew and swallow 1 Tablet by mouth daily.    atorvastatin (LIPITOR) 10 mg tablet Take 1 Tablet by mouth once daily. PATIENT NEEDS FASTING LABS    furosemide (LASIX) 40 mg tablet Take one tablet by mouth every morning.    metoprolol succinate XL (TOPROL XL) 25 mg extended release tablet Take 1 Tablet by mouth daily. potassium chloride SR (KLOR-CON M20) 20 mEq tablet Take one tablet by mouth daily. Take with a meal and a full glass of water.    sildenafiL (VIAGRA) 50 mg tablet Take one tablet by mouth as Needed for Erectile dysfunction.    valACYclovir (VALTREX) 1 gram tablet Take two tablets by mouth twice daily.            Medications Discontinued During This Encounter   Medication Reason    atorvastatin (LIPITOR) 10 mg tablet          Objective:          Vitals:    02/25/23 1522   BP: 118/72   BP Source: Arm, Right Upper   Pulse: 88   Temp: 36.7 ?C (98.1 ?F)   SpO2: 96%   TempSrc: Temporal   PainSc: Six   Weight: 68 kg (150 lb)   Height: 163.5 cm (5' 4.37)     Body mass index is 25.45 kg/m?Marland Kitchen   No LMP for male patient.      Physical Exam  Vitals and nursing note reviewed.   Constitutional:       Appearance:  Normal appearance.   HENT:      Head: Normocephalic and atraumatic.      Right Ear: External ear normal.      Left Ear: External ear normal.      Nose: Nose normal. No congestion or rhinorrhea.   Eyes:      Extraocular Movements: Extraocular movements intact.   Pulmonary:      Effort: Pulmonary effort is normal.   Musculoskeletal:      Cervical back: Normal range of motion and neck supple.      Lumbar back: Spasms and tenderness present. No swelling, edema, deformity, signs of trauma or bony tenderness. Decreased range of motion (extension/rotation). Positive left straight leg raise test. Negative right straight leg raise test.        Back:    Skin:     Coloration: Skin is not jaundiced or pale.      Findings: No rash (on visible skin).   Neurological:      General: No focal deficit present.      Mental Status: He is alert and oriented to person, place, and time.   Psychiatric:         Mood and Affect: Mood normal.         Behavior: Behavior normal.         Thought Content: Thought content normal.         Judgment: Judgment normal.         Results for orders placed or performed in visit on 02/23/23 (from the past 2 weeks) CBC AND DIFF   Result Value Ref Range    White Blood Cells 5.90 4.50 - 11.00 10*3/uL    Red Blood Cells 4.81 4.40 - 5.50 10*6/uL    Hemoglobin 14.7 13.5 - 16.5 g/dL    Hematocrit 16.1 09.6 - 50.0 %    MCV 93.1 80.0 - 100.0 fL    MCH 30.6 26.0 - 34.0 pg    MCHC 32.8 32.0 - 36.0 g/dL    RDW 04.5 40.9 - 81.1 %    Platelet Count 632 (H) 150 - 400 10*3/uL    MPV 7.5 7.0 - 11.0 fL    Neutrophils 58 41 - 77 %    Lymphocytes 31 24 - 44 %    Monocytes 8 4 - 12 %    Eosinophils 2 0 - 5 %    Basophils 1 0 - 2 %    Absolute Neutrophil Count 3.40 1.80 - 7.00 10*3/uL    Absolute Lymph Count 1.80 1.00 - 4.80 10*3/uL    Absolute Monocyte Count 0.50 0.00 - 0.80 10*3/uL    Absolute Eosinophil Count 0.10 0.00 - 0.45 10*3/uL    Absolute Basophil Count 0.10 0.00 - 0.20 10*3/uL   COMPREHENSIVE METABOLIC PANEL   Result Value Ref Range    Sodium 138 137 - 147 mmol/L    Potassium 4.3 3.5 - 5.1 mmol/L    Chloride 103 98 - 110 mmol/L    Glucose 91 70 - 100 mg/dL    Blood Urea Nitrogen 18 7 - 25 mg/dL    Creatinine 9.14 (H) 0.40 - 1.24 mg/dL    Calcium 9.5 8.5 - 78.2 mg/dL    Total Protein 7.9 6.0 - 8.0 g/dL    Total Bilirubin 0.5 0.2 - 1.3 mg/dL    Albumin 4.6 3.5 - 5.0 g/dL    Alk Phosphatase 77 25 - 110 U/L    AST 22 7 - 40 U/L    ALT 11  7 - 56 U/L    CO2 27 21 - 30 mmol/L    Anion Gap 8 3 - 12    Glomerular Filtration Rate (GFR) >60 >60 mL/min   LIPID PROFILE   Result Value Ref Range    Cholesterol 159 <200 mg/dL    Triglycerides 71 <086 mg/dL    HDL 40 (L) >57 mg/dL    LDL 846 (H) <962 mg/dL    VLDL 95.2 mg/dL    Non HDL Cholesterol 119 mg/dL   PSA SCREEN   Result Value Ref Range    PSA Screen 1.17 ng/mL   ELECTROPHORESIS-SERUM PROTEIN   Result Value Ref Range    Total Protein-SEP 7.9 6.0 - 8.0 g/dL    Albumin % 84.1 32.4 - 64.0 %    Alpha 1 % 3.8 3.6 - 4.7 %    Alpha 2 % 8.8 8.7 - 11.6 %    BETA 10.8 10.1 - 12.8 %    Gamma % 19.3 (H) 11.3 - 16.2 %    Paraprotein 0.21 g/dL    Interpretation - SEP       Spike, probably monoclonal, in beta or gamma region, cannot exclude reactive process.    Pathologist Signature       Interpreted by Pernell Dupre, MD, on 02/25/23 at 9:29 AM.  By the PATH SIGNATURE ABOVE, I attest that I have personally formulated the final interpretation expressed in this report and that the above diagnosis is based upon my examination of the slides and/or other material indicated in this report.   IMMUNOFIXATION, SERUM (IFES)   Result Value Ref Range    Immuno Fix-Serum IgG kappa paraprotein     Pathologist Signature       Interpreted by Pernell Dupre, MD, on 02/25/23 at 11:56 AM.  By the PATH SIGNATURE ABOVE, I attest that I have personally formulated the final interpretation expressed in this report and that the above diagnosis is based upon my examination of the slides and/or other material indicated in this report.   KAPPA/LAMBDA FREE LIGHT CHAINS   Result Value Ref Range    Kappa, FLC 5.01 (H) 0.33 - 1.94 mg/dL    Lambda, FLC 4.01 (H) 0.57 - 2.63 mg/dL    Kappa/Lambda FLC 0.27 (H) 0.26 - 1.65   RPR TITER FOR TREATMENT   Result Value Ref Range    RPR Treatment Reactive    HEPATITIS C ANTIBODY W REFLEX HCV PCR QUANT   Result Value Ref Range    Anti HCV Non-reactive Non-reactive   CD4 ABSOLUTE CELL COUNT   Result Value Ref Range    CD4 Count 650 491 - 1,734 cells/uL    CD4 % 30.3 (L) 31.5 - 62.4 %   RPR TITER-QUANT   Result Value Ref Range    RPR Titer 4              Assessment and Plan:           Charles Walls was seen today for patient reported other and follow up.    Diagnoses and all orders for this visit:    Congestive heart failure, NYHA class II, chronic, diastolic (HCC)  Chronic. He will continue to follow with [facility] for surveillance and management.     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 Reviewed recent lab results, given changes noted on  recent testing, refer back to oncology as previously recommended.    Methamphetamine use disorder, mild (HCC)  Sober. Last used January 2023.    Pulmonary emphysema, unspecified emphysema type (HCC)  Asymptomatic. Noted on past imaging.    HIV infection, unspecified symptom status (HCC)  Chronic. He will continue to follow with [facility].     Mixed hyperlipidemia  -     atorvastatin (LIPITOR) 20 mg tablet; Take one tablet by mouth daily.  -     LIPID PROFILE; Future; Expected date: 04/08/2023  -     LIVER FUNCTION PANEL; Future; Expected date: 04/08/2023  Chronic. Uncontrolled based on recent lab results.   Admits diet has not been ideal.   Increase atorvastatin to 20 mg daily. RTC in 2 months for repeat lab.    Acute exacerbation of chronic low back pain  -     predniSONE (DELTASONE) 20 mg tablet; Take two tablets by mouth daily with breakfast for 5 days.  -     cyclobenzaprine (FLEXERIL) 5 mg tablet; Take 1-2 tabs by mouth as needed at HS for muscle spasm.  Avoid NSAIDs due to recent elevation in creatinine.   Prednisone burst as Rx'd. Advised to begin in the morning.   Muscle relaxer at HS. Discussed sedating side effects.   Continue stretching.   Patient to follow-up for worsening symptoms or no improvement. Given chronicity of symptoms, consider imaging and specialty referral.    Housing problems  Isolation  Food insecurity  Declined social work referral.        I reviewed with the patient their current medications and specifically any new medications prescribed at the time of this visit and we reviewed the expected benefits and potential side effects. All questions are answered to the patient's satisfaction.    Patient education provided regarding diagnosis, course, and treatment. Patient was in agreement to instructions, POC, and will call if questions or concerns arise.     Total Time Today was 32 minutes in the following activities: Preparing to see the patient, Obtaining and/or reviewing separately obtained history, Performing a medically appropriate examination and/or evaluation, Counseling and educating the patient/family/caregiver, Ordering medications, tests, or procedures, Documenting clinical information in the electronic or other health record, and Independently interpreting results (not separately reported) and communicating results to the patient/family/caregiver        Return for Scheduled appt in April.      Future Appointments   Date Time Provider Department Center   04/25/2023  4:00 PM Deanne Coffer [facility]

## 2023-02-25 NOTE — Patient Instructions
 It was a pleasure to see you today, Charles Walls. I hope your back feels better soon!

## 2023-02-26 DIAGNOSIS — G8929 Other chronic pain: Secondary | ICD-10-CM

## 2023-02-26 DIAGNOSIS — D472 Monoclonal gammopathy: Secondary | ICD-10-CM

## 2023-02-28 ENCOUNTER — Encounter: Admit: 2023-02-28 | Discharge: 2023-02-28 | Payer: PRIVATE HEALTH INSURANCE

## 2023-03-01 ENCOUNTER — Encounter: Admit: 2023-03-01 | Discharge: 2023-03-01 | Payer: PRIVATE HEALTH INSURANCE

## 2023-03-07 ENCOUNTER — Encounter: Admit: 2023-03-07 | Discharge: 2023-03-07 | Payer: PRIVATE HEALTH INSURANCE

## 2023-03-08 ENCOUNTER — Encounter: Admit: 2023-03-08 | Discharge: 2023-03-08 | Payer: PRIVATE HEALTH INSURANCE

## 2023-03-09 ENCOUNTER — Encounter: Admit: 2023-03-09 | Discharge: 2023-03-09 | Payer: PRIVATE HEALTH INSURANCE

## 2023-03-09 ENCOUNTER — Encounter: Admit: 2023-03-09 | Discharge: 2023-03-09 | Payer: No Typology Code available for payment source

## 2023-03-09 ENCOUNTER — Emergency Department: Admit: 2023-03-09 | Discharge: 2023-03-10 | Payer: PRIVATE HEALTH INSURANCE

## 2023-03-09 ENCOUNTER — Emergency Department: Admit: 2023-03-09 | Discharge: 2023-03-09 | Discharge status: Against Medical Advice | Payer: PRIVATE HEALTH INSURANCE

## 2023-03-09 NOTE — Progress Notes
 Web designer - pre-visit planning note     PCP: Laren Everts B  PCP Department: [phone]       Referral Souce:   PCP, other primary care provider     Referral Reason:  SDOH and Unmet care gap needs    Current Care Gaps:  LUNG CANCER SCREENING    Last social drivers of Health Screen:     In the last 12 months, has your utility company shut off your service for not paying your bills?: (Patient-Rptd) No (02/25/2023  6:04 AM)  Are you worried that in the next 2 months, you may not have stable housing?: (!) (Patient-Rptd) Yes (02/25/2023  6:04 AM)  Are you afraid that you may be hurt in your home by someone you know?: (Patient-Rptd) No (02/25/2023  6:04 AM)  Are you afraid you might be hurt in your apartment building or neighborhood?: (Patient-Rptd) No (02/25/2023  6:04 AM)  Do problems getting childcare make it difficult to work or study?: (Patient-Rptd) No (02/25/2023  6:04 AM)  In the last 12 months, have you needed to see a doctor, but could not because of cost?: (Patient-Rptd) No (02/25/2023  6:04 AM)  In the last 12 months, did you skip medications to save money?: (Patient-Rptd) No (02/25/2023  6:04 AM)  In the past 12 months, have you ever gone without  health care because you didn't have a way to get there?: (Patient-Rptd) No (02/25/2023  6:04 AM)  Do you have problems understanding what is told to you about your medical conditions?: (Patient-Rptd) No (02/25/2023  6:04 AM)  Do you often feel that you lack companionship?: (!) (Patient-Rptd) Yes (02/25/2023  6:04 AM)  If you answered yes to any questions above, would you like to discuss help with your social work team?: (Patient-Rptd) No (02/25/2023  6:04 AM)      Number of Inpatient hospitalizations in the past 12 months:  0    Number of ED Visits in the past 12 months: 3    General Risk Score: 7   Values used to calculate this score:    Points  Metrics       0        Age: 53       0        Hospital Admissions: 0       3        ED Visits: 3       0        Non [facility] Hospital admissions: 0       0        Non [facility] visits: 0       1        Has Chronic Obstructive Pulmonary Disease: Yes       0        Has Diabetes Excluding Gestational Diabetes: No       1        Has Congestive Heart Failure: Yes       0        Has Liver Disease: No       1        Has Depression: Yes       0        Has ESRD : No       0        Has dementia : No       1        Smokes Tobacco: Yes  0        BMI: 26.30       0        Current PCP: Theodoro Grist, MD       0        Has Medicaid: No       0        Uninsured: No      Other care team support in the past 12 months  Pharmacist    Additional Notes:    How long did Pre-Planning take:  5-10 mins

## 2023-03-10 ENCOUNTER — Encounter: Admit: 2023-03-10 | Discharge: 2023-03-10 | Payer: No Typology Code available for payment source

## 2023-03-10 NOTE — Telephone Encounter
 Noted.

## 2023-03-10 NOTE — Telephone Encounter
 ED Discharge Follow Up  Reached patient: Yes, patient was identified, for their safety, using dual identification of name and date of birth  Patient Date of Birth: [birth year]  Admission Information  [facility]  ED Admission Date: 03/09/23   ED Discharge Date: 03/09/23   Admission Diagnosis:  Back pain  Discharge Diagnosis: [facility]  Hospital Services: Unplanned  Today's call is 1 (calendar) days post discharge    Discharge Instruction Review  Did patient receive and understand discharge instructions?  NA  Are there concerns regarding the patient?s ADL?s ? No    Medication Reconciliation  Changes to pre-ED visit medications?  NA  Were new prescriptions filled? N/A  Meds reviewed and reconciled? Yes    Current Outpatient Medications   Medication Instructions    abacavir-dolutegravir-lamivud (TRIUMEQ) 600-50-300 mg tablet 1 tablet, Oral, DAILY    aspirin 81 mg chewable tablet Chew and swallow 1 Tablet by mouth daily.    atorvastatin (LIPITOR) 20 mg, Oral, DAILY    cyclobenzaprine (FLEXERIL) 5 mg tablet Take 1-2 tabs by mouth as needed at HS for muscle spasm.    furosemide (LASIX) 40 mg, Oral, EVERY MORNING    metoprolol succinate XL (TOPROL XL) 25 mg, Oral, DAILY    potassium chloride SR (KLOR-CON M20) 20 mEq tablet 20 mEq, Oral, DAILY, Take with a meal and a full glass of water.    sildenafiL (VIAGRA) 50 mg, Oral, AS NEEDED    valACYclovir (VALTREX) 2,000 mg, Oral, TWICE DAILY          Understanding Condition  Having any current symptoms? Yes, Pain/discomfort. Patient states he is having significant pain in his left lower back.  He is taking Tylenol and Flexeril for the pain, but states it is not helping. States he was supposed to return to the ED yesterday evening, but was unable to make it in.  States he plans to return to ED today.   Do you have a history of Heart Failure? Yes  Patient understands when to seek additional medical care? Yes  Is patient aware of  Urgent Care locations? Yes  Urgent Care appropriate for this diagnosis? No  Other items discussed: Resources, medications, follow up     SDOH Assessment    Are you worried that in the next 2 months, you may not have stable housing?: (!) (Patient-Rptd) Yes     Scheduling Follow-up Appointment  Upcoming appointments:   Future Appointments   Date Time Provider Department Center   03/14/2023  1:00 PM Richardson Chiquito, APRN-NP CCC2 [place] Exam   04/25/2023  4:00 PM Rada Hay, APRN-NP [facility] Community     When was patient?s last PCP visit: Visit date not found  PCP primary location: [facility]  PCP appointment scheduled? Yes, Date: 04/25/23   Specialist appointment scheduled? Yes, with Oncology 03/14/23  Is assistance with transportation needed? No  MyChart message sent? Active in MyChart. No message sent.   Artera text sent? No      ED Communication   Did patient call clinic prior to going to ED? No  Reason patient went to ED: Severe pain

## 2023-03-11 ENCOUNTER — Encounter: Admit: 2023-03-11 | Discharge: 2023-03-11 | Payer: PRIVATE HEALTH INSURANCE

## 2023-03-11 ENCOUNTER — Encounter: Admit: 2023-03-11 | Discharge: 2023-03-11 | Payer: No Typology Code available for payment source

## 2023-03-11 DIAGNOSIS — Z202 Contact with and (suspected) exposure to infections with a predominantly sexual mode of transmission: Secondary | ICD-10-CM

## 2023-03-11 DIAGNOSIS — A539 Syphilis, unspecified: Secondary | ICD-10-CM

## 2023-03-14 ENCOUNTER — Encounter: Admit: 2023-03-14 | Discharge: 2023-03-14 | Payer: PRIVATE HEALTH INSURANCE

## 2023-03-16 ENCOUNTER — Encounter: Admit: 2023-03-16 | Discharge: 2023-03-16 | Payer: PRIVATE HEALTH INSURANCE

## 2023-03-20 ENCOUNTER — Encounter: Admit: 2023-03-20 | Discharge: 2023-03-20 | Payer: PRIVATE HEALTH INSURANCE

## 2023-03-21 ENCOUNTER — Ambulatory Visit: Admit: 2023-03-21 | Discharge: 2023-03-22 | Payer: PRIVATE HEALTH INSURANCE

## 2023-03-26 ENCOUNTER — Encounter: Admit: 2023-03-26 | Discharge: 2023-03-26

## 2023-03-27 ENCOUNTER — Encounter: Admit: 2023-03-27 | Discharge: 2023-03-27

## 2023-03-29 ENCOUNTER — Encounter: Admit: 2023-03-29 | Discharge: 2023-03-29

## 2023-04-13 ENCOUNTER — Encounter: Admit: 2023-04-13 | Discharge: 2023-04-13 | Payer: PRIVATE HEALTH INSURANCE

## 2023-04-18 ENCOUNTER — Encounter: Admit: 2023-04-18 | Discharge: 2023-04-18

## 2023-04-18 MED ORDER — METOPROLOL SUCCINATE 25 MG PO TB24
25 mg | ORAL_TABLET | Freq: Every day | ORAL | 3 refills | 90.00000 days | Status: AC
Start: 2023-04-18 — End: ?

## 2023-04-18 MED ORDER — VALACYCLOVIR 1 GRAM PO TAB
2000 mg | ORAL_TABLET | Freq: Two times a day (BID) | ORAL | 3 refills | Status: AC
Start: 2023-04-18 — End: ?

## 2023-04-21 ENCOUNTER — Encounter: Admit: 2023-04-21 | Discharge: 2023-04-21

## 2023-04-22 NOTE — Progress Notes
 Charles Walls is a 53 y.o. male.    Chief Complaint   Patient presents with    Follow Up     2 month follow up    Hernia     RIGHT side. Pt stated that he has a new area. Pt stated that he noticed this about a month ago. No pain right now    Labs Only     Fasting           Subjective:            Patient Reported Other  What topic(s) would you like to cover during your appointment?:  Lower back issue and a new lump near my bladder.  Please describe the issue(s) and history with the issue (location, severity, duration, symptoms, etc.).:  Hernia repair 40 plus years ago I think that?s what it is on right side and acweird pain on the left side.  What has been done so far to take care of the issue(s)?:  Nothinh  What are your goals for this visit?:  Idk      Charles Walls presents to discuss the following:    Possible hernia: Reports a bulge noted in the right groin. States possible pain here and there but nothing significant. Hx of inguinal hernia repaired when he was 18. 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     HLD: Takes atorvastatin 20 mg daily.  Lab Results   Component Value Date    CHOL 159 02/23/2023    TRIG 71 02/23/2023    HDL 40 (L) 02/23/2023    LDL 120 (H) 02/23/2023    VLDL 14.2 02/23/2023    NONHDLCHOL 119 02/23/2023          Answers submitted by the patient for this visit:  Other Symptom Questionnaire (Submitted on 04/21/2023)  Chief Complaint: Patient Reported Other  What topic(s) would you like to cover during your appointment?: Lower back issue and a new lump near my bladder.  Please describe the issue(s) and history with the issue (location, severity, duration, symptoms, etc.). : Hernia repair 40 plus years ago I think that?s what it is on right side and acweird pain on the left side.  What has been done so far to take care of the issue(s)?: Nothinh  What are your goals for this visit?: Idk    Review of Systems   Constitutional:  Negative for chills and fever.   Eyes:  Negative for visual disturbance.   Respiratory:  Negative for shortness of breath.    Cardiovascular:  Negative for chest pain and palpitations.   Gastrointestinal:  Negative for abdominal pain, constipation, diarrhea, nausea and vomiting.   Neurological:  Negative for dizziness, syncope, light-headedness and headaches.   Psychiatric/Behavioral:  Negative for decreased concentration, dysphoric mood, sleep disturbance and suicidal ideas. The patient is not nervous/anxious.    All other systems reviewed and are negative.          Your Current Medications:         Instructions    abacavir-dolutegravir-lamivud (TRIUMEQ) 600-50-300 mg tablet Take 1 Tablet by mouth once daily    aspirin 81 mg chewable tablet Chew and swallow 1 Tablet by mouth daily.    atorvastatin (LIPITOR) 20 mg tablet Take one tablet by mouth daily.    cyclobenzaprine (FLEXERIL) 5 mg tablet TAKE 1-2 TABLETS BY MOUTH AT BEDTIME AS NEEDED FOR MUSCLE SPASM    furosemide (LASIX) 40 mg  tablet Take one tablet by mouth every morning.    metoprolol succinate XL (TOPROL XL) 25 mg extended release tablet Take 1 Tablet by mouth daily.    potassium chloride SR (KLOR-CON M20) 20 mEq tablet Take one tablet by mouth daily. Take with a meal and a full glass of water.    sildenafiL (VIAGRA) 50 mg tablet TAKE 1 TABLET BY MOUTH AS NEEDED FOR ED    valACYclovir (VALTREX) 1 gram tablet Take two tablets by mouth twice daily.            Medications Discontinued During This Encounter   Medication Reason    aspirin 81 mg chewable tablet          Objective:          Vitals:    04/25/23 1536   BP: 110/62   BP Source: Arm, Right Upper   Pulse: 82   Temp: 36.8 ?C (98.2 ?F)   SpO2: 98%   TempSrc: Temporal   PainSc: Zero   Weight: 66.7 kg (147 lb)   Height: 163.5 cm (5' 4.37)     Body mass index is 24.94 kg/m?Marland Kitchen   No LMP for male patient.      Physical Exam  Vitals and nursing note reviewed.   Constitutional:       Appearance: Normal appearance.   HENT:      Head: Normocephalic and atraumatic.      Right Ear: External ear normal.      Left Ear: External ear normal.      Nose: Nose normal. No congestion or rhinorrhea.   Eyes:      Extraocular Movements: Extraocular movements intact.   Pulmonary:      Effort: Pulmonary effort is normal.   Abdominal:      Hernia: A hernia is present. Hernia is present in the right inguinal area (reduces).   Musculoskeletal:         General: Normal range of motion.      Cervical back: Normal range of motion and neck supple.   Skin:     Coloration: Skin is not jaundiced or pale.      Findings: No rash (on visible skin).   Neurological:      General: No focal deficit present.      Mental Status: He is alert and oriented to person, place, and time.   Psychiatric:         Mood and Affect: Mood normal.         Behavior: Behavior normal.         Thought Content: Thought content normal.         Judgment: Judgment normal.         No results found for this or any previous visit (from the past 2 weeks).          Assessment and Plan:           Charles Walls was seen today for follow up, hernia and labs only.    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  He is taking atorvastatin 20 mg daily.    Bulge in groin area  -     US  ABDOMEN LIMITED; Future; Expected date: 04/25/2023  Confirm inguinal hernia with US . Consider general surgery referral pending results.      I reviewed with the patient their current medications and specifically any new medications prescribed at the time of this visit and we reviewed the expected benefits and potential side effects. All questions are answered to the patient's satisfaction.    Patient education provided regarding diagnosis, course, and treatment. Patient was in agreement to instructions, POC, and will call if questions or concerns arise.     Total Time Today was 25 minutes in the following activities: Preparing to see the patient, Obtaining and/or reviewing separately obtained history, Performing a medically appropriate examination and/or evaluation, Counseling and educating the patient/family/caregiver, Ordering medications, tests, or procedures, Documenting clinical information in the electronic or other health record, and Independently interpreting results (not separately reported) and communicating results to the patient/family/caregiver        Return in about 6 months (around 10/25/2023) for physical exam.      Future Appointments   Date Time Provider Department Center   05/11/2023 10:00 AM [place]ry Paganini I, MD [facility] IM   09/02/2023  3:45 PM [place] ECHO 1 [facility] CVM Procedur   09/12/2023  3:00 PM Thomos Flies, APRN-NP CCC2 [place] Exam   09/27/2023  4:00 PM Levester Reagin, MD [facility] CVM Exam

## 2023-04-25 ENCOUNTER — Ambulatory Visit: Admit: 2023-04-25 | Discharge: 2023-04-25

## 2023-04-25 ENCOUNTER — Encounter: Admit: 2023-04-25 | Discharge: 2023-04-25

## 2023-04-25 ENCOUNTER — Ambulatory Visit: Admit: 2023-04-25 | Discharge: 2023-04-26

## 2023-04-25 DIAGNOSIS — E782 Mixed hyperlipidemia: Secondary | ICD-10-CM

## 2023-04-25 DIAGNOSIS — I700 Atherosclerosis of aorta: Secondary | ICD-10-CM

## 2023-04-25 NOTE — Patient Instructions
 It was a pleasure to see you today, Charles Walls.     You can call to schedule the ultrasound at [phone].

## 2023-04-26 ENCOUNTER — Encounter: Admit: 2023-04-26 | Discharge: 2023-04-26

## 2023-04-26 DIAGNOSIS — Z87891 Personal history of nicotine dependence: Secondary | ICD-10-CM

## 2023-04-26 DIAGNOSIS — R931 Abnormal findings on diagnostic imaging of heart and coronary circulation: Secondary | ICD-10-CM

## 2023-04-26 DIAGNOSIS — R1909 Other intra-abdominal and pelvic swelling, mass and lump: Secondary | ICD-10-CM

## 2023-04-26 NOTE — Telephone Encounter
 Patient referred for low-dose lung screening CT scan.    Contacted patient to complete screening for high -risk criteria as follows:      Patient contacted: Yes   The patient is currently: 53 y.o.    Current Smoker?  Yes     Smoking history: 39.76YRS X 2PPD    Pack years: 34.8YRS    Quit smoking within the past 15 years if not currently smoking?  No    Years since quit: NA    Do you have any concerning symptoms:  Asymptomatic    Personal history of lung cancer or other malignancy: Yes     Prior CT CHEST within the past year?   Yes, Date 04/2022 performed at [place]    If yes, images and report requested:  YES    High Risk Criteria:   Lung Screen met: Patient scheduled as follows:  APP Visit: 07/04/23  CT Lung Screening Scan: 07/04/23  Follow-up Results Phone Call: 07/04/23  Patient provided appointment information and verbalized understanding to all instructions.    Referring Provider: Charlcie Conger    Preferred location [facility] or [employer]): [facility]

## 2023-05-01 ENCOUNTER — Ambulatory Visit: Admit: 2023-05-01 | Discharge: 2023-05-01 | Payer: PRIVATE HEALTH INSURANCE

## 2023-05-01 ENCOUNTER — Encounter: Admit: 2023-05-01 | Discharge: 2023-05-01 | Payer: PRIVATE HEALTH INSURANCE

## 2023-05-02 ENCOUNTER — Encounter: Admit: 2023-05-02 | Discharge: 2023-05-02 | Payer: PRIVATE HEALTH INSURANCE

## 2023-05-02 DIAGNOSIS — K4090 Unilateral inguinal hernia, without obstruction or gangrene, not specified as recurrent: Secondary | ICD-10-CM

## 2023-05-08 ENCOUNTER — Encounter: Admit: 2023-05-08 | Discharge: 2023-05-08 | Payer: PRIVATE HEALTH INSURANCE

## 2023-05-08 ENCOUNTER — Emergency Department: Admit: 2023-05-08 | Discharge: 2023-05-08 | Payer: PRIVATE HEALTH INSURANCE

## 2023-05-08 ENCOUNTER — Emergency Department
Admit: 2023-05-08 | Discharge: 2023-05-09 | Disposition: A | Discharge status: Home / Self Care | Payer: PRIVATE HEALTH INSURANCE

## 2023-05-09 ENCOUNTER — Encounter: Admit: 2023-05-09 | Discharge: 2023-05-09 | Payer: PRIVATE HEALTH INSURANCE

## 2023-05-10 ENCOUNTER — Encounter: Admit: 2023-05-10 | Discharge: 2023-05-10 | Payer: PRIVATE HEALTH INSURANCE

## 2023-05-10 NOTE — Telephone Encounter
 Noted.

## 2023-05-10 NOTE — Telephone Encounter
 reviewed

## 2023-05-10 NOTE — Telephone Encounter
 ED Discharge Follow Up  Reached patient: Yes, patient was identified, for their safety, using dual identification of name and date of birth  Patient Date of Birth: 1970/09/04  Admission Information  H[facility] : [facility] [place]  [facility]  ED Admission Date: 05/08/23   ED Discharge Date: 05/08/23   Admission Diagnosis:  laceration  Discharge Diagnosis: Laceration of right hand without foreign body, initial encounter   Hospital Services: Unplanned  Today's call is 2 (calendar) days post discharge    Discharge Instruction Review  Did patient receive and understand discharge instructions? Yes  Are there concerns regarding the patient?s ADL?s ? No    Medication Reconciliation  Changes to pre-ED visit medications? No  Were new prescriptions filled? Yes  START taking these medications     Details   bacitracin  (BACTERICIN) 500 unit/gram topical ointment Apply one g topically to affected area twice daily. Indications: apply to hand laceration, Disp-30 g, R-0, Normal       doxycycline  hyclate (VIBRACIN) 100 mg tablet Take one tablet by mouth twice daily for 7 days. Indications: infection prophylaxis, Disp-14 tablet, R-0, Normal   Meds reviewed and reconciled? Yes    Current Outpatient Medications   Medication Instructions    abacavir -dolutegravir -lamivud (TRIUMEQ ) 600-50-300 mg tablet 1 tablet, Oral, DAILY    atorvastatin  (LIPITOR) 20 mg, Oral, DAILY    bacitracin  (BACTERICIN) 1 g, Topical, TWICE DAILY    cyclobenzaprine  (FLEXERIL ) 5 mg tablet TAKE 1-2 TABLETS BY MOUTH AT BEDTIME AS NEEDED FOR MUSCLE SPASM    doxycycline  hyclate (VIBRACIN) 100 mg, Oral, TWICE DAILY    furosemide  (LASIX ) 40 mg, Oral, EVERY MORNING    metoprolol  succinate XL (TOPROL  XL) 25 mg, Oral, DAILY    potassium chloride  SR (KLOR-CON  M20) 20 mEq tablet 20 mEq, Oral, DAILY, Take with a meal and a full glass of water .    sildenafiL  (VIAGRA ) 50 mg tablet TAKE 1 TABLET BY MOUTH AS NEEDED FOR ED    valACYclovir  (VALTREX ) 2,000 mg, Oral, TWICE DAILY          Understanding Condition  Having any current symptoms? No; he denies any pain, swelling, redness, drainage.  ED prescribed doxycycline  and topical bacitracin  and he reports taking without any difficulties.  He states he will be returning to ED to have sutures removed and denies any needs from PCP office at this time.  He is scheduled with ID tomorrow and will be able to have them look at wound.  Do you have a history of Heart Failure? Yes  Patient understands when to seek additional medical care? Yes  Is patient aware of [place] [facility]gent [facility] locations? Yes  Urgent Care appropriate for this diagnosis? No  Other items discussed:     SDOH Assessment    Are you worried that in the next 2 months, you may not have stable housing?: (!) (Patient-Rptd) Yes     Scheduling Follow-up Appointment  Upcoming appointments:   Future Appointments   Date Time Provider Department Center   05/11/2023 10:00 AM [place], [place] Lien I, MD [facility] IM   05/23/2023 11:40 AM CT - OCC PAV OCC1CT [facility]   07/04/2023 11:00 AM CTS THORACIC NURSE PRACTITIONER IC1CTS CTS   07/04/2023  2:00 PM CT - IC IC1CT ICC Radiolog   09/02/2023  3:45 PM [place] ECHO 1 [facility] CVM Procedur   09/12/2023  3:00 PM Thomos Flies, APRN-NP CCC2 [place] Exam   09/27/2023  4:00 PM Levester Reagin, MD [facility] CVM Exam   10/25/2023  1:00 PM Marnell Sinks, APRN-NP [facility] Community     When was patient?s last PCP visit: Visit date not found  PCP primary location: [facility]  PCP appointment scheduled? No, patient declined appointment  Specialist appointment scheduled? Yes, with Inf Disease 05/11/23  Is assistance with transportation needed? No  MyChart message sent? Active in MyChart. No message sent.   Artera text sent? No      ED Communication   Did patient call clinic prior to going to ED? No  Reason patient went to ED: pt unable to control bleeding    Charles Walls

## 2023-05-11 ENCOUNTER — Encounter: Admit: 2023-05-11 | Discharge: 2023-05-11 | Payer: PRIVATE HEALTH INSURANCE

## 2023-05-23 ENCOUNTER — Encounter: Admit: 2023-05-23 | Discharge: 2023-05-23 | Payer: PRIVATE HEALTH INSURANCE

## 2023-05-23 NOTE — Telephone Encounter
 Dictation on: 05/23/2023 12:00 PM by: Karl Outlaw [id number]

## 2023-05-24 ENCOUNTER — Encounter: Admit: 2023-05-24 | Discharge: 2023-05-24 | Payer: PRIVATE HEALTH INSURANCE

## 2023-05-28 ENCOUNTER — Encounter: Admit: 2023-05-28 | Discharge: 2023-05-28 | Payer: PRIVATE HEALTH INSURANCE

## 2023-05-31 ENCOUNTER — Encounter: Admit: 2023-05-31 | Discharge: 2023-05-31 | Payer: PRIVATE HEALTH INSURANCE

## 2023-06-01 ENCOUNTER — Encounter: Admit: 2023-06-01 | Discharge: 2023-06-01 | Payer: PRIVATE HEALTH INSURANCE

## 2023-06-01 NOTE — Telephone Encounter
 Charles Walls called and left a message that he has shoulder pain and can't lift his arm over his head. Called back and left VM that can go to orthopedic same day care at [facility] if not a chronic condition. Location info given.

## 2023-06-02 ENCOUNTER — Ambulatory Visit: Admit: 2023-06-02 | Discharge: 2023-06-03 | Payer: PRIVATE HEALTH INSURANCE

## 2023-06-02 ENCOUNTER — Encounter: Admit: 2023-06-02 | Discharge: 2023-06-02 | Payer: PRIVATE HEALTH INSURANCE

## 2023-06-02 NOTE — Progress Notes
 Risk Stratification Documentation (ACS/ MPB3 General Surgery)    Surgery (Procedure Planning For):  Right Inguinal Hernia Repair      Cardiology Risk Stratification   < Dr. Gunasekaran   < Request sent 05/15 via Staff Message  < Response - TBD       Infectious Disease Risk Stratification:   < Dr. Luna Salinas  < Request sent 05/05 via Staff Message  < Response - TBD

## 2023-06-03 ENCOUNTER — Encounter: Admit: 2023-06-03 | Discharge: 2023-06-03 | Payer: PRIVATE HEALTH INSURANCE

## 2023-06-03 NOTE — Telephone Encounter
-----   Message from Garfield B sent at 06/03/2023  7:19 AM CDT -----  Regarding: FW: Cardiac Risk Stratification    ----- Message -----  From: Edelmira Goodness, RN  Sent: 06/02/2023   3:03 PM CDT  To: Levester Reagin, MD; Cvm Nurse Interven#  Subject: Cardiac Risk Stratification                      Dear Dr. Gunasekaran - Cardiology:  The patient listed above is under consideration for the following surgical procedure(s):  # Right Inguinal Hernia RepairBefore the surgery can be considered, I am requesting your expert input regarding risk stratification as it relates to the patient's cardiac status and management of any cardiac medications and/or anticoagulation pre and post surgery.    The patient currently has a consultation general surgery appointment to discuss possible surgery on 07/09.  If possible, your information will be needed prior to the consultation appointment. If more time is needed for tests, exams, etc prior to giving your input, then the general surgery appointment will be rescheduled.  Thank you for your input.  We value your contribution in this patient's care.? Please forward communication and/or an office note when this has been completed (fax [phone]).? Sincerely, Jerald Molly, NP [facility] [place]  General Surgery/[address], [address], [address] [place], [place]? [phone]

## 2023-06-06 ENCOUNTER — Encounter: Admit: 2023-06-06 | Discharge: 2023-06-06 | Payer: PRIVATE HEALTH INSURANCE

## 2023-06-06 NOTE — Telephone Encounter
-----   Message from [place] B sent at 06/06/2023  3:40 PM CDT -----  Regarding: FW: Cardiac Risk Stratification    ----- Message -----  From: Levester Reagin, MD  Sent: 06/06/2023   3:33 PM CDT  To: Cvm Nurse Interventional Team [place]; Surger#  Subject: RE: Cardiac Risk Stratification                  I will see him with an echo in clinicWe will offer risk assessment after the clinic apptThanks  ----- Message -----  From: Edelmira Goodness, RN  Sent: 06/02/2023   3:03 PM CDT  To: Levester Reagin, MD; Cvm Nurse Interven#  Subject: Cardiac Risk Stratification                      Dear Dr. Gunasekaran - Cardiology:  The patient listed above is under consideration for the following surgical procedure(s):  # Right Inguinal Hernia RepairBefore the surgery can be considered, I am requesting your expert input regarding risk stratification as it relates to the patient's cardiac status and management of any cardiac medications and/or anticoagulation pre and post surgery.    The patient currently has a consultation general surgery appointment to discuss possible surgery on 07/09.  If possible, your information will be needed prior to the consultation appointment. If more time is needed for tests, exams, etc prior to giving your input, then the general surgery appointment will be rescheduled.  Thank you for your input.  We value your contribution in this patient's care.? Please forward communication and/or an office note when this has been completed (fax [phone]).? Sincerely, Jerald Molly, NP [facility] [place]  General Surgery/[address], [address], [address] [place], [place]? [phone]

## 2023-06-06 NOTE — Telephone Encounter
 Spoke with patient. He already has an echo and visit scheduled with Dr. Gunasekaran in Aug/Sept. Advised we will try to move up those appointments to sometime prior to July. Will have a scheduler call him.

## 2023-06-07 ENCOUNTER — Encounter: Admit: 2023-06-07 | Discharge: 2023-06-07 | Payer: PRIVATE HEALTH INSURANCE

## 2023-06-07 DIAGNOSIS — E782 Mixed hyperlipidemia: Secondary | ICD-10-CM

## 2023-06-07 MED ORDER — ATORVASTATIN 20 MG PO TAB
20 mg | ORAL_TABLET | Freq: Every day | ORAL | 0 refills | 90.00000 days | Status: AC
Start: 2023-06-07 — End: ?

## 2023-06-08 ENCOUNTER — Encounter: Admit: 2023-06-08 | Discharge: 2023-06-08 | Payer: PRIVATE HEALTH INSURANCE

## 2023-06-08 DIAGNOSIS — Z202 Contact with and (suspected) exposure to infections with a predominantly sexual mode of transmission: Secondary | ICD-10-CM

## 2023-06-08 DIAGNOSIS — B20 Human immunodeficiency virus [HIV] disease: Secondary | ICD-10-CM

## 2023-06-08 NOTE — Telephone Encounter
 Spoke with Charles Walls, advised we need him to repeat labs from previous bump in titer.    He states he will come in the next few days.

## 2023-06-08 NOTE — Telephone Encounter
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 Luna Salinas - Infectious Disease:  The patient listed above is under consideration for the following surgical procedure(s):  # Right Inguinal Hernia RepairBefore the surgery can be considered, I am requesting your expert input regarding risk stratification as it relates to the patient's infectious disease status and management of any HIV medications pre and post surgery.    The patient currently has a consultation general surgery appointment to discuss possible surgery on 07/09.  If possible, your information will be needed prior to the consultation appointment. If more time is needed for tests, exams, etc prior to giving your input, then the general surgery appointment will be rescheduled.  Thank you for your input.  We value your contribution in this patient's care.? Please forward communication and/or an office note when this has been completed (fax [phone]).? Sincerely, Jerald Molly, NP [facility]  General Surgery/[address]uma[address], [address] Floor, [address] [place], [place]? [phone]

## 2023-06-09 ENCOUNTER — Encounter: Admit: 2023-06-09 | Discharge: 2023-06-09 | Payer: PRIVATE HEALTH INSURANCE

## 2023-06-09 NOTE — Progress Notes
 Specialty Medication Reassessment Attempt    Medication name: ABACAVIR-DOLUTEGRAVIR-LAMIVUD 600-50-300 MG PO TAB    Contacted Cheryn Coss to verify compliance and assess tolerance of their specialty medication.    Sent Cheryn Coss a MyChart message. Patient can reply via MyChart or call the pharmacist at [phone]. Will follow up in 2 business day(s) if patient has not returned call.    Mr. Dimond was last seen by Dr. Luna Salinas 07/14/22, currently no follow up appt scheduled.      Hershel Los, PharmD, BCPS, AAHIVP  [phone]

## 2023-06-14 ENCOUNTER — Ambulatory Visit: Admit: 2023-06-14 | Discharge: 2023-06-14 | Payer: PRIVATE HEALTH INSURANCE

## 2023-06-14 ENCOUNTER — Encounter: Admit: 2023-06-14 | Discharge: 2023-06-14 | Payer: PRIVATE HEALTH INSURANCE

## 2023-06-14 ENCOUNTER — Ambulatory Visit: Admit: 2023-06-14 | Discharge: 2023-06-15 | Payer: PRIVATE HEALTH INSURANCE

## 2023-06-14 NOTE — Progress Notes
 Pharmacy Medication Reassessment    Summary of Visit    Charles Walls replied to [employer]:    Alls well meds are good going to have labs today no side effects. My med list is accurate. I?ve had one immunization for pneumonia in the last year. And a new Covid shot     Cont atorvastatin.  The 10-year ASCVD risk score (Arnett DK, et al., 2019) is: 8.7%    Values used to calculate the score:      Age: 53 years      Sex: Male      Is Non-Hispanic African American: No      Diabetic: No      Tobacco smoker: Yes      Systolic Blood Pressure: 129 mmHg      Is BP treated: No      HDL Cholesterol: 40 mg/dL      Total Cholesterol: 159 mg/dL      Indication/Regimen  The regimen of ABACAVIR-DOLUTEGRAVIR-LAMIVUD 600-50-300 MG PO TAB indefinitely is appropriate for Charles Walls who has HIV (human immunodeficiency virus infection) (CMS-HCC).    Renal dose adjustments are not required. Hepatic dose adjustments are not required. Dose titration is not required.    The patient has the ability to self-administer the medication(s).    Regimen Assessment  The patient is on a single tablet regimen.    The patient's regimen was not changed.    Therapeutic Goals and Monitoring  Patient is HIV positive. HIV genotype has been reviewed and treatment with select therapy is appropriate. The goal of therapy is to decrease viral load and increase CD4 count.    Patient has completed labs. Patient's viral load is suppressed.    The patient is making progress toward achieving their therapeutic goal. The plan is to continue current therapy.    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    Lymphadenopathy    Spondylosis of cervical joint    Reactive lymphoid hyperplasia    Immunodeficiency, unspecified    Pulmonary emphysema, unspecified emphysema type (CMS-HCC)    History of syphilis    Perirectal abscess    Lymphoma of lymph nodes of neck, unspecified lymphoma type (CMS-HCC)    MDD (major depressive disorder), recurrent, in full remission    Aortic calcification    Congestive heart failure, NYHA class II, chronic, diastolic (CMS-HCC)    MGUS (monoclonal gammopathy of unknown significance)     Additional comorbidities: no    Labs and Diagnostic Tests  Lab Results   Component Value Date/Time    HEPBSAG NONREACTIVE 08/27/2020 11:52 AM    HEPBSAB NEG 08/27/2020 11:52 AM    HEPBCTOTAL REACTIVE (A) 08/27/2020 11:52 AM       Lab Results   Component Value Date/Time    HEPCAB NONREACTIVE 02/06/2021 09:10 AM       No results found for: HCVPCRIUML, LOGHCPQ, HCECPCR, HCVPCR, HEPCGENPCR  Lab Results   Component Value Date/Time    HIV12AGABSCN REACTIVE (A) 08/27/2020 11:52 AM  Lab Results   Component Value Date/Time    HIVPCR Detected (A) 03/21/2023 09:58 AM    CD4 650 02/23/2023 12:40 PM    HIVULTRA HIV-1 RNA DETECTED, LESS THAN 20 RNA COPIES/ML (A) 11/04/2021 10:52 AM    HIVCOPIESML 23 03/21/2023 09:58 AM         Allergies  Allergies   Allergen Reactions    Sulfa (Sulfonamide Antibiotics) SEE COMMENTS     Patient reports that his father and brother are allergic, he is unsure so he reports it as an allergy.         Immunizations  Vaccine history was reviewed with the patient. Education was provided on the importance of completing vaccines, including annual influenza vaccine and COVID-19 vaccine.    Immunization History   Administered Date(s) Administered    COVID-19 (PFIZER), mRNA vacc, 30 mcg/0.3 mL (PF) 05/07/2019, 05/28/2019    Covid-19 mRNA Vaccine >=12yo (Moderna)(Spikevax) 11/04/2021    Flu Vaccine =>6 Months Quadrivalent PF 11/18/2020 Flu Vaccine =>6 Months Trivalent PF 10/04/2013    Flu Vaccine =>65 YO High-Dose (PF) 02/11/2016, 11/24/2016    Flu Vaccine =>65 YO High-Dose Quadrivalent (PF) 02/07/2019, 11/04/2021    Hepatitis B Vaccine Adult 3 Dose IM 10/01/2013    Meningococcal Conjug Vaccine IM (MenACWY-CRM)(Menveo) 06/18/2015, 02/11/2016, 04/14/2022    Pneumococcal Vaccine (20-Val) 01/19/2022    Pneumococcal Vaccine (23-Val Adult) 10/01/2013    Pneumococcal Vaccine(13-Val Peds/immunocompromised adult) 07/31/2013    Tdap Vaccine 05/11/2013, 08/09/2019, 08/11/2022    Zoster Vaccine Recombinant, Adjuvanted (shingles) IM (vial 2 of 2)(SHINGRIX) 09/21/2022    Zoster Vaccine Recombinant, adjuvant suspension component (vial 1 of 2)(SHINGRIX) 09/21/2022    smallpox and Mpox Vaccine Live PF ID 09/19/2020, 02/20/2021       Medication Reconciliation  Medication history and reconciliation were performed (including prescription medications, supplements, over the counter, and herbal products). The medication list was updated and the patient's current medication list is included. The patient was instructed to speak with their health care provider before starting any new drug, including prescription or over the counter, natural / herbal products, or vitamins.    Drug Interactions    Drug-Drug Interactions  Drug-drug interactions were evaluated. There were not clinically significant drug-drug interactions.     Drug-Food Interactions  Drug-food interactions were evaluated. There are not clinically significant drug-food interactions.    triumeq should be taken with or without food.    Home Medications    Medication Sig   abacavir-dolutegravir-lamivud (TRIUMEQ) 600-50-300 mg tablet Take 1 Tablet by mouth once daily   atorvastatin (LIPITOR) 20 mg tablet Take 1 Tablet by mouth once daily   cyclobenzaprine (FLEXERIL) 5 mg tablet TAKE 1-2 TABLETS BY MOUTH AT BEDTIME AS NEEDED FOR MUSCLE SPASM   furosemide (LASIX) 40 mg tablet Take one tablet by mouth every morning. metoprolol succinate XL (TOPROL XL) 25 mg extended release tablet Take 1 Tablet by mouth daily.   potassium chloride SR (KLOR-CON M20) 20 mEq tablet Take one tablet by mouth daily. Take with a meal and a full glass of water.   sildenafiL (VIAGRA) 50 mg tablet TAKE 1 TABLET BY MOUTH AS NEEDED FOR ED   valACYclovir (VALTREX) 1 gram tablet Take two tablets by mouth twice daily.     Adverse Drug Reactions  Adverse drug reactions were reviewed with the patient.    Significant adverse drug reaction(s) were not identified.    Side effect(s) were not reported.    Adherence  Refill and adherence history were reviewed with the patient.  The patient was educated on the importance of adherence.    Patient is adherent with refills: yes  Patient is meeting refill adherence goal: yes    Patient reported 0 missed doses over the past 1 month.  Significance of missed doses: NA - no missed doses   Patient is meeting reported adherence goal.    Safety Precautions    Risk Evaluation and Mitigation (REMS) Assessment: REMS is not required for this medication.    Safety precautions were not addressed and discussed with the patient as applicable.    Contraindications: Charles Walls does not have contraindications to this medication.    Toxoplasmosis screening, sexually transmitted infections testing, and comorbid conditions assessment:    Patient has been screened for tuberculosis, toxoplasmosis, STIs, and assessed for comorbid conditions as appropriate.    Depression screening:    Patient has been appropriately screened by healthcare team.    Substance abuse screening (tobacco, alcohol, illicit substances):    Patient was screened for substance abuse and counseling was provided.    Pregnancy Status: Male, education not applicable.    Medication Education  Counseling was not completed because patient was previously educated and did not require additional counseling.    Charles Walls was given the opportunity to ask questions but did not have any questions at this time. Patient was reminded of the refill process and encouraged to call with questions. The monitoring and follow-up plan was discussed with the patient. The patient was instructed to contact their health care provider if their symptoms or health problems do not get better or if they become worse. For clinical questions about this medication, the pharmacist can be reached at [phone]. For questions about cost, insurance coverage, or to obtain refills, the patient should contact the pharmacy via MyChart or by calling [phone]. The patient verbalized acceptance and understanding.    Follow-up Plan  The patient will be reassessed within 1 year.    Discussed option to fill at a [facility] pharmacy. The medication(s) will be received from an outside pharmacy ([facility]) based on patient preference due to preference to continue to fill at their current pharmacy.    Hershel Los, PharmD, BCPS, AAHIVP  [phone]

## 2023-06-14 NOTE — Progress Notes
 Specialty Medication Reassessment Attempt    Medication name: TRIUMEQ 600-50-300 MG PO TAB    Contacted Cheryn Coss to verify compliance and assess tolerance of their specialty medication.    Left voicemail asking patient to return call to the pharmacist at [phone]. This is the second attempt to contact the patient. Will follow up in 2 business day(s) if patient has not returned call.    Mr. Teare last had pharmacy reassessment 07/14/22 and does not have a future appointment scheduled.       Dameion Briles, PHARMD

## 2023-06-16 ENCOUNTER — Encounter: Admit: 2023-06-16 | Discharge: 2023-06-16 | Payer: PRIVATE HEALTH INSURANCE

## 2023-06-16 DIAGNOSIS — Z21 Asymptomatic human immunodeficiency virus [HIV] infection status: Secondary | ICD-10-CM

## 2023-06-16 NOTE — Progress Notes
 I called patient about test showing his HIV VL is up to 50,000 copies/mL  He reports feeling ok but has been having issues with his partner and has been missing some of his ART up to 5x/month  I encouraged adherence with meds  I will have him repeat HIV VL in a month  I asked him to call to schedule a follow up visit with me first opening

## 2023-06-24 ENCOUNTER — Encounter: Admit: 2023-06-24 | Discharge: 2023-06-24 | Payer: PRIVATE HEALTH INSURANCE

## 2023-06-24 NOTE — Telephone Encounter
 Charles Walls called and stated that he fell and broke his nose and was not sure what he needs to do next . Called back and left vm that we do not have any openings with any providers today and should be evaluated in urgent care or Er depending on the amount of bleeding or injury that he is experiencing.

## 2023-06-28 ENCOUNTER — Encounter: Admit: 2023-06-28 | Discharge: 2023-06-28 | Payer: PRIVATE HEALTH INSURANCE

## 2023-06-28 DIAGNOSIS — M545 Acute exacerbation of chronic low back pain: Secondary | ICD-10-CM

## 2023-06-28 MED ORDER — CYCLOBENZAPRINE 5 MG PO TAB
ORAL_TABLET | ORAL | 0 refills | 30.00000 days | Status: AC
Start: 2023-06-28 — End: ?

## 2023-06-28 NOTE — Telephone Encounter
 Last seen 04/07    Last Filled 03/28/23 #60-0 refill

## 2023-07-01 NOTE — Progress Notes
 Telephone Visit  Counseling and Shared Decision Making Documentation for Screening for Lung Cancer with Low Dose Computed Tomography        Beneficiary eligibility criteria were verified to include:   Age 54-80 (or 54-62 CMS) years old-54 y.o. years old-54 y.o.  Symptoms- None     Social History     Tobacco Use   Smoking Status Every Day    Current packs/day: 2.00    Average packs/day: 2.0 packs/day for 39.5 years (79.0 ttl pk-yrs)    Types: Cigarettes    Start date: 1986    Passive exposure: Current   Smokeless Tobacco Never   Tobacco Comments    Cutting down slowly.        NEW          CTA Chest 04/2022    A discussion of the LDCT was provided to include:   Benefits and harm of screening   Possible follow-up diagnostic testing   Over-diagnosis   False positivity   Radiation exposure   Importance of adherence to annual lung cancer screening   Smoking cessation or continued abstinence     The patient meets criteria for a LDCT, questions were answered, and patient has agreed to proceed.      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)    Fracture cervical vertebra-closed (CMS-HCC)    Gastroesophageal reflux disease without esophagitis    Gout    Heart murmur    Human immunodeficiency virus (HIV) disease (CMS-HCC)    Hyperlipidemia    Kidney disease    Lung disease    Malignant neoplasm of urinary system (CMS-HCC)    Melanoma (CMS-HCC)    Other and unspecified hyperlipidemia    Other specified disorders of arteries and arterioles    Pneumonia    Psoriasis, unspecified Pulmonary emphysema (CMS-HCC)    Sepsis (CMS-HCC)    Sexually transmitted disease    Skin cancer    Suicide attempt (CMS-HCC)    Tachyarrhythmia    Valvular heart disease         Plan:  Results Reviewed and Discussed with Patient via telephone on 07/04/23 at  1512:  1.  Stable pulmonary nodules but no new or enlarging nodule. (LUNG-RADS 2)   Continue annual screening with low-dose Chest CT in 12 months.   2.  Marked aortic valve calcification suggestive of aortic stenosis.    3.  Mildly prominent axillary lymph nodes which appear similar to the   prior exam with normal fatty hilum.       Explanation of Lung-RADS categories can be found at:   [employer] of Radiology Committee on Lung-RADS?SABRA Seaman   Assessment Categories 2022. Available at   [email or web address].          Finalized by Kaitlin Marquis, M.D. on 07/04/2023 3:05 PM. Dictated by   Erlinda Sartorius, M.D. on 07/04/2023 2:47 PM.  We will plan to follow up with the patient in 12 months' time with a LDCT scan of the chest. We discussed additional findings of aortic valve stenosis and an he will follow up with his cardiologist        PCP- Dr. Elsa, Noberto KATHEE Duwaine Meliton, APRN (Referring)         Catheryn Grizzle, APRN, FNP-C  Lung Cancer Screening and Thoracic Surgery   [phone]

## 2023-07-03 ENCOUNTER — Encounter: Admit: 2023-07-03 | Discharge: 2023-07-03 | Payer: PRIVATE HEALTH INSURANCE

## 2023-07-04 ENCOUNTER — Encounter: Admit: 2023-07-04 | Discharge: 2023-07-04 | Payer: PRIVATE HEALTH INSURANCE

## 2023-07-04 ENCOUNTER — Ambulatory Visit: Admit: 2023-07-04 | Discharge: 2023-07-04 | Payer: PRIVATE HEALTH INSURANCE

## 2023-07-04 DIAGNOSIS — Z87891 Personal history of nicotine dependence: Secondary | ICD-10-CM

## 2023-07-04 DIAGNOSIS — Z122 Encounter for screening for malignant neoplasm of respiratory organs: Secondary | ICD-10-CM

## 2023-07-06 ENCOUNTER — Encounter: Admit: 2023-07-06 | Discharge: 2023-07-06 | Payer: PRIVATE HEALTH INSURANCE

## 2023-07-06 ENCOUNTER — Ambulatory Visit: Admit: 2023-07-06 | Discharge: 2023-07-07 | Payer: PRIVATE HEALTH INSURANCE

## 2023-07-06 DIAGNOSIS — R0989 Other specified symptoms and signs involving the circulatory and respiratory systems: Secondary | ICD-10-CM

## 2023-07-06 DIAGNOSIS — E782 Mixed hyperlipidemia: Secondary | ICD-10-CM

## 2023-07-06 DIAGNOSIS — R0609 Other forms of dyspnea: Secondary | ICD-10-CM

## 2023-07-06 NOTE — Patient Instructions
 Follow-Up:    -Thank you for allowing us  to participate in your care today. Your After Visit Summary is being completed by Stephane Guardian, RN.    -We would like you to follow up in 1 years with Prasad Gunasekaran, MD  -The schedule is released approximately 4-5 months in advance. You will be called by our scheduling department to make an appointment and you will also receive a notification via MyChart to self-schedule.  However, if you would like to call to make this appointment, please call [phone].    Dr. Lonnie has ordered a Fasting Lipid Profile for you to have completed in 3 months.  You will need to fast for 12 hours prior to the lab work.      For lab orders:  If you are able to come to a [place] location, orders are in the system   [facility] outpatient lab: [address], [address] 7am - 6pm M-F, 7 to noon -Sat. Phone: [phone]   [facility] lab: [address], [place] 7:30 - 5:00 M-F   Phone: [phone]        Contacting our office:    -Business Hours: Monday-Friday, 8:00 am-4:30 pm (excluding Holidays).     -For medical questions or concerns, please send us  a message through your MyChart account or call the [facility] team nursing triage line at [phone]. Please leave a detailed message with your name, date of birth, and reason for your call.  If your message is received before 3:30pm, every effort will be made to call you back the same day.  Please allow time for us  to review your chart prior to call back.     -For medication refills please start by contacting your pharmacy. You can also send us  a prescription question through your MyChart or call the nurse triage line above.     -Please allow a minimum of 10-14 business days for clearance from your cardiologist for procedures and/or FMLA, disability, or  DOT forms. An office visit or testing may be required for us  to provide clearance.     [place] nursing team fax number: [phone]    -You may receive a survey in the upcoming weeks from [facility]. Your feedback is important to us  and helps us  continue to improve patient care and patient satisfaction.     -Please feel free to call our Financial Department at [phone] with any questions or concerns about estimated cost of testing or imaging ordered today. We are happy to provide CPT codes upon request.    Results & Testing Follow Up:    -Please allow 5-7 business days for the results of any testing to be reviewed. Please call our office if you have not heard from a nurse within this time frame.    -Should you choose to complete testing at an outside facility, please contact our office after completion of testing so that we can ensure that we have received results for your provider to review.    Lab and test results:  As a part of the CARES act, starting 04/19/2019, some results will be released to you via MyChart immediately and automatically.  You may see results before your provider sees them; however, your provider will review all these results and then they, or one of their team, will notify you of result information and recommendations.   Critical results will be addressed immediately, but otherwise, please allow us  time to get back with you prior to you  reaching out to us  for questions.  This will usually take about 72 hours for labs and 5-7 days for procedure test results.

## 2023-07-06 NOTE — Progress Notes
 CARDIOLOGY PROGRESS NOTE    Charles Walls                      Events  Hemodynamic parameters: Mildly elevated blood pressure normal heart rate noted in our office today.    Subjective:    No classical anginal symptoms reported by the patient  No worsening heart failure symptoms reported by the patient  Not taking any extra doses of Lasix  Weight has been relatively stable.    Assessment & Recs     1.  Preoperative cardiovascular risk assessment for hernia surgery under general anesthesia  2.  Moderate aortic insufficiency  3.  Bicuspid aortic valve with mild stenosis  4.  HIV infection  5.  Mixed hyperlipidemia  6.  Elevated coronary artery calcium score    53 year old gentleman with a history of HIV infection with a bicuspid aortic valve.  He has underlying mild aortic valve stenosis and moderate aortic valve insufficiency.  He also was noted to have no significant aortic root or ascending aortic dilation based upon a recent CTA.  He presents for annual follow-up and to assess his candidacy for general anesthesia based hernia surgery.  Clinically he is euvolemic.  No reoccurrence of classical angina reported by the patient.  Adherent to all his medications.  No major bleeding complications.  Remains on aspirin monotherapy.    Recommendations    1.  Last known LDL level was 170 mg/dL.  Recently his Lipitor was increased to 20 nightly by his PCP.  Agree with this change.  Repeat lipid profile in 3 months.  Target LDL less than 55 mg/dL  2.  If suboptimal control is noted, low threshold for Crestor 40+ Zetia versus PCSK9 inhibitors  3.  Home blood pressure readings are within acceptable limits.  Target less than 140/90 mmHg  4.  Previous stress test showed no scintigraphic evidence of ischemia.  Normal LVEF and no high risk features noted.  5.  He has had an echocardiogram in August 2024.  Mild aortic stenosis and moderate AI noted.  Pressure half-time's close to 250-400 ms.  Bicuspid aortic valve apparatus noted.  Severe calcification noted albeit without severe stenosis.  Velocities are consistent with mild AS.  6. The patient demonstrates no anginal symptoms.    Functional status has been relatively stable.  Patient is able to demonstrate at least 4-10 METS  Patient does not have any severely stenotic or regurgitant valvular lesions or high-grade AV block or ventricular arrhythmias or active anginal symptoms.  No recent acute coronary syndrome within the last 30 days  Overall revised cardiac risk index is less than 1%.  This is a intermediate risk procedure with general anesthesia  Patient can proceed at a intermediate risk for perioperative adverse cardiac events for a intermediate risk procedure without any further testing.        Amelie Boots, MD, [facility], [facility]  Interventional Cardiologist    Medications  Current medications reviewed    Review Of Systems  None besides facts mentioned above      Physical Exam                          Vital Signs: Most Recent   Vitals:    07/06/23 1342   BP: (!) 148/78   BP Source: Arm, Right Upper   Pulse: 97   SpO2: 96%   O2 Device: None (Room air)   PainSc: Zero  Weight: 66.7 kg (147 lb 1.6 oz)   Height: 162.6 cm (5' 4)          General Appearance: moderately overweight, no distress   Neck Veins: normal JVP , neck veins are not distended   Cardiovascular system: Pulse 86/min, regular rhythm , normal volume, no specific character  PMI undisplaced, no palpable thrills/heaves, S1 S2 heard, 2/6 ejection systolic murmur radiating to the carotids, no rub, no jugular venous distension, no carotid bruit.  Carotid Arteries: normal carotid upstroke bilaterally, no bruits   Respiratory system: No acute distress  No use of accessory muscles  Normal vesicular breath sounds over all the lung fields bilaterally  No added sounds      Lab/Radiology/Other Diagnostic Tests:    Pertinent labs, cardiac imaging and EKG reviewed.    Total billing time spent today was utilized for the following activities: Preparing to see the patient, Obtaining and/or reviewing separately obtained history, Performing a medically appropriate examination and/or evaluation, Counseling and educating the patient/family/caregiver, Ordering medications, tests, or procedures, Referring and communication with other health care professionals (when not separately reported), Documenting clinical information in the electronic or other health record, Independently interpreting results (not separately reported) and communicating results to the patient/family/caregiver, and Care coordination (not separately reported)    This note was dictated using the dragon speech recognition software.  Transcription errors may occur with the use of this software. Editing and proofreading were done by the author of this document.  In spite of the author's best effort to identify every error introduced by voice to text dictation, some errors that may represent misspelling or misstatements of what was dictated may persist.  If there are questions about content in this document please contact Amelie Boots MD

## 2023-07-14 ENCOUNTER — Ambulatory Visit: Admit: 2023-07-14 | Discharge: 2023-07-14 | Payer: PRIVATE HEALTH INSURANCE

## 2023-07-14 ENCOUNTER — Encounter: Admit: 2023-07-14 | Discharge: 2023-07-14 | Payer: PRIVATE HEALTH INSURANCE

## 2023-07-20 ENCOUNTER — Encounter: Admit: 2023-07-20 | Discharge: 2023-07-20 | Payer: PRIVATE HEALTH INSURANCE

## 2023-07-21 ENCOUNTER — Encounter: Admit: 2023-07-21 | Discharge: 2023-07-21 | Payer: PRIVATE HEALTH INSURANCE

## 2023-07-27 ENCOUNTER — Encounter: Admit: 2023-07-27 | Discharge: 2023-07-27 | Payer: PRIVATE HEALTH INSURANCE

## 2023-07-27 ENCOUNTER — Ambulatory Visit: Admit: 2023-07-27 | Discharge: 2023-07-28 | Payer: PRIVATE HEALTH INSURANCE

## 2023-07-27 DIAGNOSIS — K4091 Unilateral inguinal hernia, without obstruction or gangrene, recurrent: Principal | ICD-10-CM

## 2023-07-27 NOTE — Progress Notes
 Date of Service: 07/27/23      Subjective:     Charles Walls is a 53 y.o. male    History of Present Illness  53 year old male with past medical history of HTN, HLD, CHF, pulmonary emphysema, HIV/AIDS, melanoma, open right inguinal hernia repair (~53 years old), s/p laparoscopic appendectomy (2008), RCC s/p laparoscopic partial left nephrectomy (2019).  Patient is a current everyday smoker.    Patient is followed by cardiology, Dr. Gunasakaran.  Cardiology noted patient was an intermediate risk with no other testing indicated.  Patient is also followed by infectious disease, Dr. Jeraline.  He he is managed on [employer] .     Patient was last seen in clinic on 5/15.  He reports right inguinal hernia has been present for 3 months now.  He feels as though it is gotten slightly larger since his last appointment.  Hernia is painful and protrudes out more than when it first appeared.  Patient notes that it is painful in the morning when he uses the restroom and also when he is exercising.  Hernia is reducible without erythema or firmness.  Denies any bowel changes.  US  4/13 demonstrates small right inguinal hernia containing omental fat entrapped ascitic fluid.  CT was not approved by and better.    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)    Fracture cervical vertebra-closed (CMS-HCC)    Gastroesophageal reflux disease without esophagitis    Gout    Heart murmur    Human immunodeficiency virus (HIV) disease (CMS-HCC)    Hyperlipidemia    Kidney disease    Lung disease    Malignant neoplasm of urinary system (CMS-HCC)    Melanoma (CMS-HCC)    Other and unspecified hyperlipidemia    Other specified disorders of arteries and arterioles    Pneumonia    Psoriasis, unspecified    Pulmonary emphysema (CMS-HCC)    Sepsis (CMS-HCC)    Sexually transmitted disease    Skin cancer    Suicide attempt (CMS-HCC)    Tachyarrhythmia    Valvular heart disease      reports that he has been smoking cigarettes. He started smoking about 39 years ago. He has a 118.5 pack-year smoking history. He has been exposed to tobacco smoke. He has never used smokeless tobacco. He reports that he does not currently use alcohol. He reports that he does not currently use drugs after having used the following drugs: Methamphetamines.  Surgical History:   Procedure Laterality Date    COLONOSCOPY N/A 06/17/2015    Performed by Ledora Catalina, MD at [facility] ENDO    ROBOT ASSISTED LAPAROSCOPIC NEPHRECTOMY PARTIAL WITH INTRAOPERATIVE ULTRASOUND  Left 06/09/2017    Performed by Jackee Milan, MD at [facility] OR    BIOPSY/ EXCISION LYMPH NODE DEEP CERVICAL Left  08/11/2020    Performed by Cleola Bi, MD at [facility] OR    INCISION AND DRAINAGE ISCHIORECTAL/ PERIRECTAL ABSCESS N/A 06/02/2021    Performed by Marchelle Lamar BIRCH, MD at [facility] OR    APPENDECTOMY  Agw 19    CARDIOVASCULAR STRESS TEST  2024    COLONOSCOPY  2018    ECHOCARDIOGRAM PROCEDURE  2023    ELECTROCARDIOGRAM  2023    HERNIA REPAIR      HX APPENDECTOMY  2008    HX SKIN BIOPSY      KIDNEY SURGERY  2019    LYMPH NODE DISSECTION      PR LAPAROSCOPY SURG RPR INITIAL INGUINAL HERNIA      right    SURGERY        abacavir -dolutegravir -lamivud (TRIUMEQ ) 600-50-300 mg tablet Take 1 Tablet by mouth once daily    atorvastatin  (LIPITOR) 20 mg tablet Take 1 Tablet by mouth once daily    cyclobenzaprine  (FLEXERIL ) 5 mg tablet TAKE 1-2 TABLETS BY MOUTH AT BEDTIME AS NEEDED FOR MUSCLE SPASM (Patient taking differently: Take one tablet by mouth as Needed. TAKE 1-2 TABLETS BY MOUTH AT BEDTIME AS NEEDED FOR MUSCLE SPASM)    furosemide  (LASIX ) 40 mg tablet Take one tablet by mouth every morning.    metoprolol  succinate XL (TOPROL  XL) 25 mg extended release tablet Take 1 Tablet by mouth daily.    potassium chloride  SR (KLOR-CON  M20) 20 mEq tablet Take one tablet by mouth daily. Take with a meal and a full glass of water .    sildenafiL  (VIAGRA ) 50 mg tablet TAKE 1 TABLET BY MOUTH AS NEEDED FOR ED (Patient taking differently: Take one tablet by mouth as Needed.)    valACYclovir  (VALTREX ) 1 gram tablet Take two tablets by mouth twice daily. (Patient taking differently: Take two tablets by mouth as Needed.)         Objective:  There were no vitals filed for this visit.    There is no height or weight on file to calculate BMI.    Physical Exam  Constitutional:       Appearance: Normal appearance.   HENT:      Head: Normocephalic and atraumatic.   Eyes:      Conjunctiva/sclera: Conjunctivae normal.   Pulmonary:      Effort: Pulmonary effort is normal.   Abdominal:  Small Right inguinal hernia reducible without erythema or firmness.  Genitourinary:     Comments: none  Skin:     General: Skin is warm and dry.   Neurological:      Mental Status: He is alert.   Psychiatric:         Mood and Affect: Mood normal.         Behavior: Behavior normal.    Physical Exam  Abdominal:                IMAGING/DIAGNOSTICS:  US  4/13:  IMPRESSION   FINDINGS/IMPRESSION:     There is a small right inguinal hernia containing omental fat and trapped ascitic fluid. The hernia increases with Valsalva and is at least partially reducible with release of Valsalva. No herniated bowel loops identified.     No suspicious lymph nodes identified in the right groin.      Assessment & Plan:  There are no diagnoses linked to this encounter.    53 year old male with past medical history of HTN, HLD, CHF, pulmonary emphysema, HIV/AIDS, melanoma, open right inguinal hernia repair (~53 years old), s/p laparoscopic appendectomy (2008),  RCC s/p laparoscopic partial left nephrectomy (2019).    We discussed modifiable risk factors such as nicotine  cessation.  Nicotine  users are significantly more likely to experience serious postoperative complications within 30 days.  One prevalent complication is postoperative is wound healing.  If poor wound healing occurs drains, packing/dressings, wound vacs may be necessary.  However, nicotine  also has an increased likelihood of 30-day mortality, overall morbidity, respiratory complications, and cardiac complications.  Cotinine test will be performed 2 weeks prior to elective appointment and also 2 weeks prior to surgery.  If cotinine test is positive elective appointment and/or surgery will be postponed or canceled.    Continue smoking cession 6 weeks after      Nicotine  patches to be ordered for patient.  He states that this is previously helped.  May consider Wellbutrin  if ineffective    2.  Discussed specific risks of inguinal hernia repair including:  -Most commonly, recurrence of the hernia 5%   -Mesh infection  -Damage to blood vessels, organs or nerves.    -Damage or shrinkage to the testicles if a connected blood vessel is damaged.    -Long term pain at the incision site requiring postoperative intervention by pain management/anesthesia for nerve block  -seroma or fluid collection postoperatively.    Discussed postoperative weight restrictions of no more than 10 lbs. lifting, pushing, or pulling for 6 weeks.  Follow up appointment will be scheduled for 4 weeks after surgery.      3.  Discussed concerning signs and symptoms of incarceration or strangulation that would prompt ED evaluation such as fever, nausea, vomiting, chills, or excessive pain, redness at the hernia site, firmness at hernia site, non reducibility.  Suggested hernia belt for comfort.  By nature hernias do tend to get the bigger over time.    4. Discussed all of patients questions and concerns.  Encouraged patient to call the office with any further questions or concerns.    5.  Schedule patient for robotically assisted right inguinal hernia repair after cotinine test is negative.  2 hours outpatientPatient does not have to return to clinic.  Will contact Dr. Katrinka Ee for date when patient is nicotine  free.    ATTESTATION    I personally interviewed and examined the patient.  I have reviewed the history, physical, impression and plan outlined by the Nurse Practitioner.    The patient presents with (HPI) right groin pain, history of open RIH repair at age of 87,  On examination there is right inguinal hernia, recurrent, non-incarcerated,   My impression is recurrent RIH, current nicotine  use,  My plan is nicotine  cessation and operative repair of symptomatic right inguinal hernia.     Discussed importance of smoking cessation prior to offering hernia repair. Resources given. Once patient has stopped smoking for 2 weeks he can call clinic to schedule surgery. Needs to be smoking free 6 weeks before and 6 weeks after surgery to prevent complications and recurrence. Will cotinine test prior to day of surgery.     We did discuss minimally invasive right inguinal hernia repair in detail. Discussed risks of surgery including bleeding, infection, and damage to surrounding structures including intestines, spermatic cord, nerve pain, need for possible open procedure and risk of recurrence. Discussed alternatives to surgery including medical management. Patient agrees to proceed with surgery. All questions answered. To OR for robotic assisted minimally invasive right inguinal hernia repair once smoking cessation achieved.      Staff name:  Antonae Zbikowski E Hunter Rose,  MD Date: 07/27/2023

## 2023-07-29 ENCOUNTER — Encounter: Admit: 2023-07-29 | Discharge: 2023-07-29 | Payer: PRIVATE HEALTH INSURANCE

## 2023-07-29 MED ORDER — SILDENAFIL 50 MG PO TAB
ORAL_TABLET | ORAL | 2 refills | 45.00000 days | Status: AC
Start: 2023-07-29 — End: ?

## 2023-07-29 NOTE — Telephone Encounter
 Last seen 04/07    Last Filled 05/12 #20-2 refills

## 2023-08-23 ENCOUNTER — Encounter: Admit: 2023-08-23 | Discharge: 2023-08-23 | Payer: PRIVATE HEALTH INSURANCE

## 2023-08-23 NOTE — Telephone Encounter
 Reason for Conversation  Diarrhea    Background   Charles Walls called and stated that he has had diarrhea for 28 hours. He stated that it is water  , yellow in color and mucous noted. He also has vomited 8 times in the last 24 hours. He states that he has abdominal pain 6-7/10  like someone punched me in the stomach and it is constant. He states that everytime he drinks he vomits and he is weak and dizzy and has not urinated since yesterday. He stated that his hands are swollen and he has lower back pain. Advised to go to urgent care or ER for possible IV hydration and to have someone drive him there. Charles Walls verbalized understanding.     Disposition   GO TO ED/UCC NOW (OR TO OFFICE WITH PCP APPROVAL)    Reason for Disposition      Drinking very little and dehydration suspected (e.g., no urine > 12 hours, very dry mouth, very lightheaded)    Constant abdominal pain lasting > 2 hours    1. DIARRHEA SEVERITY: How bad is the diarrhea? How many more stools have you had in the past 24 hours than normal?       Diarrhea for about 28 hours  2. ONSET: When did the diarrhea begin?       28 hours ago  3. STOOL DESCRIPTION:  How loose or watery is the diarrhea? What is the stool color? Is there any blood or mucous in the stool?      Water , yellow in color , mucous   4. VOMITING: Are you also vomiting? If Yes, ask: How many times in the past 24 hours?       8 times  5. ABDOMEN PAIN: Are you having any abdomen pain? If Yes, ask: What does it feel like? (e.g., crampy, dull, intermittent, constant)       Punched me in stomach , constant  6. ABDOMEN PAIN SEVERITY: If present, ask: How bad is the pain?  (e.g., Scale 1-10; mild, moderate, or severe)      6-7/10  7. ORAL INTAKE: If vomiting, Have you been able to drink liquids? How much liquids have you had in the past 24 hours?      Everytime drinks he vomits  8. HYDRATION: Any signs of dehydration? (e.g., dry mouth [not just dry lips], too weak to stand, dizziness, new weight loss) When did you last urinate?      Weak and dizzy, urinated last yesterday  9. EXPOSURE: Have you traveled to a foreign country recently? Have you been exposed to anyone with diarrhea? Could you have eaten any food that was spoiled?      Denies,   10. ANTIBIOTIC USE: Are you taking antibiotics now or have you taken antibiotics in the past 2 months?        Denies  11. OTHER SYMPTOMS: Do you have any other symptoms? (e.g., fever, blood in stool)        Chilling yesterday, hands swollen, lower back pain  12. PREGNANCY: Is there any chance you are pregnant? When was your last menstrual period?        N/a    No Additional Information on file.    Protocols Used     Diarrhea-A-[place]

## 2023-08-25 ENCOUNTER — Encounter: Admit: 2023-08-25 | Discharge: 2023-08-25 | Payer: PRIVATE HEALTH INSURANCE

## 2023-08-25 DIAGNOSIS — B20 Human immunodeficiency virus [HIV] disease: Principal | ICD-10-CM

## 2023-08-25 MED ORDER — TRIUMEQ 600-50-300 MG PO TAB
1 | ORAL_TABLET | Freq: Every day | ORAL | 0 refills | 30.00000 days | Status: AC
Start: 2023-08-25 — End: ?

## 2023-08-25 NOTE — Telephone Encounter
 LOV: 07/14/23  NOV: 09/28/23  Labs last drawn on: 06/14/23  Viral load: [id number] copies/mL  Routing to Dr. Margart Cosier for approval.   Dorn Finder, RN

## 2023-08-29 ENCOUNTER — Encounter: Admit: 2023-08-29 | Discharge: 2023-08-29 | Payer: PRIVATE HEALTH INSURANCE

## 2023-09-05 ENCOUNTER — Encounter: Admit: 2023-09-05 | Discharge: 2023-09-05 | Payer: PRIVATE HEALTH INSURANCE

## 2023-09-24 ENCOUNTER — Encounter: Admit: 2023-09-24 | Discharge: 2023-09-24 | Payer: PRIVATE HEALTH INSURANCE

## 2023-09-24 ENCOUNTER — Emergency Department
Admit: 2023-09-24 | Discharge: 2023-09-25 | Disposition: A | Discharge status: Home / Self Care | Payer: PRIVATE HEALTH INSURANCE

## 2023-09-24 ENCOUNTER — Emergency Department: Admit: 2023-09-24 | Discharge: 2023-09-24 | Payer: PRIVATE HEALTH INSURANCE

## 2023-09-26 ENCOUNTER — Encounter: Admit: 2023-09-26 | Discharge: 2023-09-26 | Payer: PRIVATE HEALTH INSURANCE

## 2023-09-27 NOTE — Progress Notes
 Subjective:     History of Present Illness  Charles Walls is a 53 y.o. male here for HIV follow up.     Patient has been followed by Dr Reena Ruth previously until 2014 when he switched to [facility]. He was following Dr Johanne until she closed her outpatient practice and he transferred to me. Prior regimens Kaletra, Presizta, Viracept (diarrhea), Viread, Truvada , Combivir.     He reports he was diagnosed with HIV in 2000 while in [facility] (risk MSM). He then moved to [place]  for few years and came back in 2006. He had relationship with women in the past, but was married to an HIV negative male. They are using condoms except for oral sex. They have no other partners for the past 2 years. Charles Walls was diagnosed with Chlamydia in June 2014 and treated. His partner was also treated. He continues on Darunavir, Truvada , Norvir and has good virological and immunological response. He said he was seen q53months by his [place]  doctor and would like this way. Currently feeling well. His feet are better after he started Kelfex and topical creams prescribed by Dermatology last week. He is enrolled in RWSW at [facility] [place], and also has Media planner. 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 He is tested for TB there and has always been negative. Still smokes 1 ppd and is interested in quitting but waiting for change in insurance in March to get discount on his premium.        02/20/21  Follow up from recent hospitalization with left arm group a strep soft tissue infection requiring an I&D and drainage of an abscess.  He was on IV antibiotics was changed to oral Keflex  for 10 more days on discharge on 02/09/2021. He returns to clinic today reporting improvement in the left wrist infection site currently nontender.  Swelling has resolved. He has some residual pain in the left elbow as well and with range of motion of the left wrist. Reports good compliance with Triumeq .  Of note while hospitalized his viral load was about 5000 copies per mL. Genotype done showed wild-type virus.  He had prior minor NNRTI and PI mutations but no M184V or rilpivirine resistance. He is asking about Cabenuva.  Also reports to having a new boyfriend however they are not sexually active yet.  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  RPR on 02/06/2021 was 16 received 1 dose of Bicillin  LA.  Devious RPR on 09/19/2020 was 4.    11/04/21  He is doing well on Triumeq .  Patient was hospitalized in May 2023 with an MRSA buttock abscess that requires incision and drainage.  The area has healed since then. He went to the lab this morning before the clinic visit labs are pending. Got Flu and Covid vaccines today. No diarrhea, skin itching or skin infection currently.    04/14/22  Doing well on Triumeq . Was in urgent care for possible constipation, with fever 103F for 1 days then in bed for 4h.    Had diverticulitis twice before and typhlitis 2007 and diverticulitis another in 2018.  Kidney cancer did partial nephrectomy 2018.      09/28/23  Patient here for follow up. Continues on Triumeq .   States that he has missed 4 doses of his Triumeq  in last month. Occurs when he stays over with a friend, forgets his medication.     Recent ED visit on 09/24/23 for HA, chills, productive cough with green sputum. Tested positive  for rhinovirus/enterovirus. States he is still having some coughing today. Denies fevers, chills.     Has stopped smoking since ED visit. Previously 1.5 PPD.     Currently sexually active, 1 male partner who is HIV negative, participates in oral/anal/receptive sex.       Review of Systems   Constitutional: Negative.    Respiratory:  Negative for shortness of breath.    Gastrointestinal:  Positive for diarrhea.   Musculoskeletal:  Positive for arthralgias.       Objective:          abacavir -dolutegravir -lamivud (TRIUMEQ ) 600-50-300 mg tablet Take 1 Tablet by mouth once daily    atorvastatin  (LIPITOR) 20 mg tablet Take 1 Tablet by mouth once daily furosemide  (LASIX ) 40 mg tablet Take one tablet by mouth every morning.    metoprolol  succinate XL (TOPROL  XL) 25 mg extended release tablet Take 1 Tablet by mouth daily.    potassium chloride  SR (KLOR-CON  M20) 20 mEq tablet Take one tablet by mouth daily. Take with a meal and a full glass of water . (Patient taking differently: Take one tablet by mouth every 48 hours. Take with a meal and a full glass of water .)    sildenafiL  (VIAGRA ) 50 mg tablet TAKE 1 TABLET BY MOUTH AS NEEDED FOR ED    valACYclovir  (VALTREX ) 1 gram tablet Take two tablets by mouth twice daily. (Patient taking differently: Take two tablets by mouth as Needed.)     Vitals:    09/28/23 1015   BP: 115/74   Pulse: 95   Temp: 36.5 ?C (97.7 ?F)   TempSrc: Oral   PainSc: Seven   Weight: 64 kg (141 lb)   Height: 164.5 cm (5' 4.75)       Body mass index is 23.65 kg/m?SABRA     Physical Exam  Constitutional: No distress.   HENT: unremarkable  Head: Normocephalic and atraumatic.   Eyes: No scleral icterus.    Pulmonary/Chest: Mild crackles b/l lung fields, no wheezing, on room air  Neurological: He is alert.  Skin: No rash, healing left wrist wound.  Psychiatric: He has a normal mood and affect.   Vitals reviewed.       Assessment:  HIV   Diagnosed 2000 while in [place]. Dr Alm Finch [facility]).   Establishment of care with [facility] : 07/31/13  Mode of transmission / risk factors : MSM  CD4 count / viral load at the time of diagnosis : not sure , likely CD 4 around 250  History of OI : none reported  Need for prophylaxis : none reported  FePO4 14% (within normal)     Past HIV data Viral load/ CD4 count, ART therapy :  Date   CD4 ( % )  Viral load ( log)  ART therapy  Comments / genotype Resistence testing     09/24/23 709, 28.2%      06/14/23  54,640 Triumeq , on/off    03/21/23  23     02/23/23 650, 30.3%      11/28/13  In process  <40 (<1.6)        08/27/13  993(48.7)  <40 (<1.6)        06/13/13    54 (1.73)  ([place])      03/30/13  Not done  <20(<1.3)        03/26/13 724(57%)?  24(1.380        01/15/13  1124(48)  <20 (<1.3)        10/23/12  1154  <20(<1.3)  08/23/12  901  <20(<1.3)        07/27/12  991  <20(<1.3)  Likely switched to new regimen daruavir and ritonavir instead of Kaletra  (mid 2014)    06/30/12  1052  <20 ND (<1.3)        05/08/12  948(46)  6108.41        03/27/12  Not done  22(1.34)        03/13/12  Not done  31(1.4)        02/25/12  120(52)  41(1.61)        02/11/12  037(56)  34(1.53)        10/27/12  8927(53)  61(1.79)        01/14/12  849(50)  46(1.66)  Was on Keletra and Truvada       12/23/11  936 (46)  53 (1.72)           Base line lab parameter / screening   TB test: T spot TB test negative on 07/31/13  Toxoplasma serology: IgG negative on 07/31/13  Hepatitis serology: 07/31/13  Hepatitis BS antigen: negative  Hepatitis BS antibody: 10.8. (immune)  Hepatitis B core antibody: negative  Hepatitis A antibody: positive  Hepatis C antibody: negative    Immunizations   Hepatitis vaccine : received both A&B: received booster Hepatitis B vaccine 10/01/13  Tdap vaccine : 05/11/13  Prevnar ( 07/31/13), PPSV 23 ( 10/01/13) --> PPSV23 booster needed 2020  Menveo in 2017 --> booster needed 2023  Annual flu shot  Mpox 09/2020 --> 02/20/21    H/O STD / STD screening / anal pap/ risk factors   H/O STDs : H/O chlamydia 07/27/12 : treated  Syphilis test : negative / patient in 07/2012  Urine for GC / CT PCR : 07/31/13 Negative , syphilis negative  Anal pap smear : 2-3 years ago , negative / patient  On going risk factors :  H/O multiple sexual partners in the past.  Separated from previous HIV-neg husband. Now new boyfriend   Anal pap discussed 02/2014. He declined.  Completed 3 doses of Bicillin  LA on 09/01/16 RPR 2.  09/19/20 RPR 4, 02/06/21 RPR 16 given 1 dose Bicillin  LA     Prolonged ART therapy / lab monitoring   UA 05/21/16 protein negative , glucose negative.  hemoglobin A1C 5.5 on 02/02/17.  Vitamin D level (on 05/21/16) 29.3: Continue OTC Vitamin D    Admit for Group A Strep L arm cellulitis/abscess    - s/p I&D and course of Abx   - 02/20/21 Follow up wound healed. Having diarrhea will check for C diff. Stopping Keflex .     Other co morbidities   History of tinea pedis.  Intermittent chest pain  : Tobacco dependence. Not drinking beer anymore. Stop smoking in January 2019 and using Vapor.  : Has strong family history of heart attack.   : EF 65-70%, TEE 02/04/17: Bicuspid aortic valve with focal sclerosis.  History of neck pain. Seen neurosurgery in the past per note.  Chronic cough : currently being addressed by PCP : 25 year pack smoking history.  Headache. Head CT unremarkable 11/28/13.  Rt shoulder pain, chronic  ED in Dec 2017 with alcohol intoxication (suicide attempt ?).    H/o Diverticulitis  Admitted 02/01/17 - 02/04/17, treated    Left upper pole renal mass  Incidental findings when he was admitted with Diverticulitis in January 2019.  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   Scheduled for partial  nephrectomy in March 209.      Plan:  Labs today: CBC, CMP, HIV VL, RPR, throat GC/CT  Anal pap today  Flu shot today   Not have COVID vaccines available in clinic today, advised to [facility] to [facility]   Continue Triumeq   Advised patient on importance of adherence  Follow-up in 3 months     Staffed with Dr. Margart Cosier    Charles Claretta Mandes, DO  Infectious Disease Fellow, PGY-4

## 2023-09-28 ENCOUNTER — Encounter: Admit: 2023-09-28 | Discharge: 2023-09-28 | Payer: PRIVATE HEALTH INSURANCE

## 2023-09-28 ENCOUNTER — Ambulatory Visit: Admit: 2023-09-28 | Discharge: 2023-09-29 | Payer: PRIVATE HEALTH INSURANCE

## 2023-09-28 VITALS — BP 115/74 | HR 95 | Temp 97.70000°F | Ht 64.75 in | Wt 141.0 lb

## 2023-09-28 DIAGNOSIS — Z21 Asymptomatic human immunodeficiency virus [HIV] infection status: Principal | ICD-10-CM

## 2023-09-28 DIAGNOSIS — Z23 Encounter for immunization: Secondary | ICD-10-CM

## 2023-09-28 LAB — CHLAM/NG PCR SWAB
~~LOC~~ BKR CHLAMYDIA TRACH NAAT: NEGATIVE
~~LOC~~ BKR CHLAMYDIA TRACH NAAT: NEGATIVE
~~LOC~~ BKR NEISSERIA GONORROEAE NAAT: NEGATIVE
~~LOC~~ BKR NEISSERIA GONORROEAE NAAT: NEGATIVE

## 2023-09-28 NOTE — Telephone Encounter
 ED Discharge Follow Up  Reached patient: Yes, Direct Clinic Contact- seen by [facility] 09/28/23   Patient Date of Birth: Feb 07, 1970  Admission Information  Hospital Name : [facility]  ED Admission Date: 09/24/23   ED Discharge Date: 09/24/23   Admission Diagnosis: Shortness of breath / cough  Discharge Diagnosis: Enterovirus infection   Hospital Services: Unplanned  Today's call is 4 (calendar) days post discharge    Medication Reconciliation  Changes to pre-ED visit medications? No  Were new prescriptions filled? N/A  Meds reviewed and reconciled? Yes    Current Outpatient Medications   Medication Instructions    abacavir -dolutegravir -lamivud (TRIUMEQ ) 600-50-300 mg tablet 1 tablet, Oral, DAILY    atorvastatin  (LIPITOR) 20 mg, Oral, DAILY    furosemide  (LASIX ) 40 mg, Oral, EVERY MORNING    metoprolol  succinate XL (TOPROL  XL) 25 mg, Oral, DAILY    potassium chloride  SR (KLOR-CON  M20) 20 mEq tablet 20 mEq, Oral, DAILY, Take with a meal and a full glass of water .    sildenafiL  (VIAGRA ) 50 mg tablet TAKE 1 TABLET BY MOUTH AS NEEDED FOR ED    valACYclovir  (VALTREX ) 2,000 mg, Oral, TWICE DAILY      SDOH Assessment    Are you worried that in the next 2 months, you may not have stable housing?: (!) (Patient-Rptd) Yes     Scheduling Follow-up Appointment  Upcoming appointments:   Future Appointments   Date Time Provider Department Center   10/03/2023  3:00 PM Tess Eleanor CROME, APRN-NP [facility] [place] [facility]   10/25/2023  1:00 PM Meliton Duwaine SAUNDERS, APRN-NP [facility]     When was patient?s last PCP visit: Visit date not found  PCP primary location: [facility]  PCP appointment scheduled? No, no ED f/u  Specialist appointment scheduled? Yes, with [facility] seen 09/28/23  Is assistance with transportation needed? No  MyChart message sent? Active in MyChart. No message sent.   Artera text sent? No      ED Communication   Did patient call clinic prior to going to ED? No  Reason patient went to ED: Unable to obtain    Montie Haver

## 2023-09-28 NOTE — Telephone Encounter
 reviewed

## 2023-09-28 NOTE — Progress Notes
 Administered 0.5mL influenza HD Trivalent vaccine into right deltoid.  Patient signed consent form and was given VIS sheet. Patient tolerated injection well and had no complaints.

## 2023-09-28 NOTE — Progress Notes
 Pharmacy Medication Reassessment    Summary of Visit    [name] cont on Triumeq , missed 4 doses over past mo.  Stays over at someone's house, doesn't have with him.  Discussed high # missed doses & encouraged adherence.    Still symptomatic (4 days ago pos for rhino-enterovirus).    Cont atorvastatin .  The 10-year ASCVD risk score (Arnett DK, et al., 2019) is: 8%    Values used to calculate the score:      Age: 53 years      Clinically relevant sex: Male      Is Non-Hispanic African American: No      Diabetic: No      Tobacco smoker: Yes      Systolic Blood Pressure: 115 mmHg      Is BP treated: No      HDL Cholesterol: 38 mg/dL      Total Cholesterol: 160 mg/dL      Indication/Regimen  The regimen of ABACAVIR -DOLUTEGRAVIR -LAMIVUD 600-50-300 MG PO TAB indefinitely is appropriate for Charles Walls who has HIV (human immunodeficiency virus infection) (CMS-HCC).    Renal dose adjustments are not required. Hepatic dose adjustments are not required. Dose titration is not required.    The patient has the ability to self-administer the medication(s).      Baseline Characteristics    Patient is negative for HLA-B*[id number] and is okay to proceed with abacavir  treatment.    Regimen Assessment  The patient is on a single tablet regimen.    The patient's regimen was not changed.    Therapeutic Goals and Monitoring  Patient is HIV positive. HIV genotype has been reviewed and treatment with select therapy is appropriate. The goal of therapy is to decrease viral load and increase CD4 count.    Patient has completed labs. Patient's viral load is not suppressed.    The patient is making progress toward achieving their therapeutic goal. The plan is to continue current therapy.      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    Lymphadenopathy    Spondylosis of cervical joint    Reactive lymphoid hyperplasia    Immunodeficiency, unspecified    Pulmonary emphysema, unspecified emphysema type (CMS-HCC)    History of syphilis    Perirectal abscess    Lymphoma of lymph nodes of neck, unspecified lymphoma type (CMS-HCC)    MDD (major depressive disorder), recurrent, in full remission    Aortic calcification    Congestive heart failure, NYHA class II, chronic, diastolic (CMS-HCC)    MGUS (monoclonal gammopathy of unknown significance)     Additional comorbidities: no      Labs and Diagnostic Tests  Lab Results   Component Value Date/Time    HEPBSAG NONREACTIVE 08/27/2020 11:52 AM    HEPBSAB NEG 08/27/2020 11:52 AM    HEPBCTOTAL REACTIVE (A) 08/27/2020 11:52 AM       Lab Results   Component Value Date/Time    HIVPCR Detected (A) 06/14/2023 03:39 PM    CD4 709 09/24/2023 06:13 PM  HIVULTRA HIV-1 RNA DETECTED, LESS THAN 20 RNA COPIES/ML (A) 11/04/2021 10:52 AM    HIVCOPIESML 54,640 06/14/2023 03:39 PM       Lab Results   Component Value Date/Time    HIV12AGABSCN REACTIVE (A) 08/27/2020 11:52 AM           Allergies  Allergies[1]     Immunizations  Vaccine history was reviewed with the patient. Education was provided on the importance of completing vaccines, including annual influenza vaccine and COVID-19 vaccine.    Immunization History   Administered Date(s) Administered    COVID-19 (PFIZER), mRNA vacc, 30 mcg/0.3 mL (PF) 05/07/2019, 05/28/2019    Covid-19 mRNA Vaccine >=12yo (Moderna)(Spikevax) 11/04/2021    Flu Vaccine =>6 Months Quadrivalent PF 11/18/2020    Flu Vaccine =>6 Months Trivalent PF 10/04/2013    Flu Vaccine =>65 YO High-Dose (PF) 02/11/2016, 11/24/2016    Flu Vaccine =>65 YO High-Dose Quadrivalent (PF) 02/07/2019, 11/04/2021    Hepatitis B Vaccine Adult 3 Dose IM 10/01/2013    Meningococcal Conjug Vaccine IM (MenACWY-CRM)(Menveo) 06/18/2015, 02/11/2016, 04/14/2022    Pneumococcal Vaccine (20-Val) 01/19/2022    Pneumococcal Vaccine (23-Val Adult) 10/01/2013    Pneumococcal Vaccine(13-Val Peds/immunocompromised adult) 07/31/2013    Tdap Vaccine 05/11/2013, 08/09/2019, 08/11/2022    Zoster Vaccine Recombinant, Adjuvanted (shingles) IM (vial 2 of 2)(SHINGRIX) 09/21/2022    Zoster Vaccine Recombinant, adjuvant suspension component (vial 1 of 2)(SHINGRIX) 09/21/2022    smallpox and Mpox Vaccine Live PF ID 09/19/2020, 02/20/2021       Medication Reconciliation  Medication history and reconciliation were performed (including prescription medications, supplements, over the counter, and herbal products). The medication list was updated and the patient's current medication list is included. The patient was instructed to speak with their health care provider before starting any new drug, including prescription or over the counter, natural / herbal products, or vitamins.    Drug Interactions    Drug-Drug Interactions  Drug-drug interactions were evaluated. There were not clinically significant drug-drug interactions.     Drug-Food Interactions  Drug-food interactions were evaluated. There are not clinically significant drug-food interactions.    triumeq  should be taken with or without food.      Home Medications   Medication Sig   abacavir -dolutegravir -lamivud (TRIUMEQ ) 600-50-300 mg tablet Take 1 Tablet by mouth once daily   atorvastatin  (LIPITOR) 20 mg tablet Take 1 Tablet by mouth once daily   furosemide  (LASIX ) 40 mg tablet Take one tablet by mouth every morning.   metoprolol  succinate XL (TOPROL  XL) 25 mg extended release tablet Take 1 Tablet by mouth daily.   potassium chloride  SR (KLOR-CON  M20) 20 mEq tablet Take one tablet by mouth daily. Take with a meal and a full glass of water .  Patient taking differently: Take one tablet by mouth every 48 hours. Take with a meal and a full glass of water .   sildenafiL  (VIAGRA ) 50 mg tablet TAKE 1 TABLET BY MOUTH AS NEEDED FOR ED   valACYclovir  (VALTREX ) 1 gram tablet Take two tablets by mouth twice daily.  Patient taking differently: Take two tablets by mouth as Needed.       Adverse Drug Reactions  Adverse drug reactions were reviewed with the patient.    Significant adverse drug reaction(s) were not identified.    Side effect(s) were not reported.      Adherence  Refill and adherence history were reviewed with the patient. The patient was educated on the importance of adherence.    Patient is adherent with refills: yes  Patient is meeting refill adherence goal: yes    Patient reported 4 missed doses over the past 1 month.  Significance of missed doses: significant, provider notified  Patient is not meeting reported adherence goal.    The following factors contributed to non-adherence: education needed  The following strategies were recommended to improve adherence: directed education      Safety Precautions    Risk Evaluation and Mitigation (REMS) Assessment: REMS is not required for this medication.    Safety precautions were not addressed and discussed with the patient as applicable.    Contraindications: Charles Walls does not have contraindications to this medication.    Toxoplasmosis screening, sexually transmitted infections testing, and comorbid conditions assessment:    Patient has been screened for tuberculosis, toxoplasmosis, STIs, and assessed for comorbid conditions as appropriate.    Depression screening:    Patient has been appropriately screened by healthcare team.    Substance abuse screening (tobacco, alcohol, illicit substances):    Patient was screened for substance abuse and counseling was provided.      Pregnancy Status: Male, education not applicable.      Medication Education  Counseling was not completed because patient was previously educated and did not require additional counseling.    Charles Walls was given the opportunity to ask questions. Patient did not have any questions at this time. Patient was reminded of the refill process and encouraged to call with questions. The monitoring and follow-up plan was discussed with the patient. The patient was instructed to contact their health care provider if their symptoms or health problems do not get better or if they become worse. For clinical questions about this medication, the pharmacist can be reached at [phone]. For questions about cost, insurance coverage, or to obtain refills, the patient should contact the pharmacy via MyChart or by calling [phone]. The patient verbalized acceptance and understanding.    Follow-up Plan  The patient will be reassessed within 1 year.    Discussed option to fill at a [facility] pharmacy. The medication(s) will be received from an outside pharmacy ([facility]) based on patient preference due to preference to continue to fill at their current pharmacy.       Charles Walls, PharmD, BCPS, AAHIVP  [phone]         [1]   Allergies  Allergen Reactions    Sulfa (Sulfonamide Antibiotics) SEE COMMENTS     Patient reports that his father and brother are allergic, he is unsure so he reports it as an allergy.

## 2023-09-30 ENCOUNTER — Encounter: Admit: 2023-09-30 | Discharge: 2023-09-30 | Payer: PRIVATE HEALTH INSURANCE

## 2023-09-30 ENCOUNTER — Ambulatory Visit: Admit: 2023-09-30 | Discharge: 2023-10-01 | Payer: PRIVATE HEALTH INSURANCE

## 2023-09-30 ENCOUNTER — Ambulatory Visit: Admit: 2023-09-30 | Discharge: 2023-09-30 | Payer: PRIVATE HEALTH INSURANCE

## 2023-09-30 VITALS — BP 106/63 | HR 97 | Temp 98.60000°F | Resp 16 | Ht 64.764 in | Wt 140.4 lb

## 2023-09-30 DIAGNOSIS — J441 Chronic obstructive pulmonary disease with (acute) exacerbation: Secondary | ICD-10-CM

## 2023-09-30 DIAGNOSIS — R0602 Shortness of breath: Principal | ICD-10-CM

## 2023-09-30 MED ORDER — IPRATROPIUM-ALBUTEROL 0.5 MG-3 MG(2.5 MG BASE)/3 ML IN NEBU
3 mL | Freq: Once | RESPIRATORY_TRACT | 0 refills | Status: CP
Start: 2023-09-30 — End: ?
  Administered 2023-09-30: 21:00:00 3 mL via RESPIRATORY_TRACT

## 2023-09-30 MED ORDER — ALBUTEROL SULFATE 90 MCG/ACTUATION IN HFAA
2 | RESPIRATORY_TRACT | 1 refills | 25.00000 days | Status: AC | PRN
Start: 2023-09-30 — End: ?

## 2023-09-30 MED ORDER — PREDNISONE 10 MG PO TAB
ORAL_TABLET | 0 refills | Status: AC
Start: 2023-09-30 — End: ?

## 2023-09-30 MED ORDER — AZITHROMYCIN 250 MG PO TAB
ORAL_TABLET | ORAL | 0 refills | Status: AC
Start: 2023-09-30 — End: ?

## 2023-09-30 NOTE — Patient Instructions
 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   COPD (chronic obstructive pulmonary disease) is a common lung disease. It causes your airways to get irritated and narrower. This makes it harder for you to breathe. Emphysema and chronic bronchitis are both types of COPD. This is a long-term (chronic) condition. This means you always have it. But how severe it is may vary. When it gets worse, it's called a flare-up.     Symptoms of COPD  People with COPD may have symptoms most of the time. In a flare-up, your symptoms get worse. Symptoms that may mean you are having a flare-up include:   Shortness of breath, shallow or rapid breathing, or wheezing that gets worse.  Lung infection.  A cough that gets worse.  More mucus (or sputum), thicker mucus, or mucus of a different color.  Tiredness, less energy, or trouble doing your routine activities.  Fever.  Chest tightness.  Trouble talking.  You feel confused.  Causes of flare-ups  A flare-up can happen even if you did everything right and followed your health care provider?s instructions. Some causes of flare-ups are:   Cold weather.  Smoking or secondhand smoke.  Use of e-cigarettes or vaping products.  Colds, the flu, or other respiratory infections.  Air pollution.  Sudden change in the weather.  Dust, vapors, gases, irritating chemicals, or strong fumes.  Not taking your medicines as prescribed.  Indoor pollution, such as burning wood, smoke from home cooking, or heating fuels.  Home care  Here are some things you can do at home to treat a flare-up:   Keep calm and try not to panic. This makes it harder to breathe and keeps you from doing the right things.  Don?t smoke or be around others who are smoking. If you smoke, quit. Smoking is the main most common cause of COPD. Quitting will help you manage your COPD. Don't use e-cigarettes or vaping products either. Ask your health care provider about ways to help you quit smoking.  Before drinking extra fluids during flare-ups to loosen the mucus, always talk with your healthcare provider first.  Eat a healthy, balanced diet. It is important to stay as healthy as possible. So is trying to stay at your ideal weight. Being overweight or underweight can affect your health. Make sure you have a lot of fruits and vegetables every day. And also eat balanced portions of whole grains, lean meats and fish, and low-fat dairy products.  Use your inhalers and nebulizer, if you have one, as you have been told to. When using a metered dose inhaler or nebulizer, it's very important to use the correct methods. If you have any questions about how to use your device, contact your health care provider or refer to the user manual.  If you were given antibiotics, take them until they are used up or your provider tells you to stop. It?s important to finish the antibiotics even though you feel better. This will make sure the infection has cleared.  If you were given a steroid, use it for the length of time prescribed. This is important even if you feel better.  Learn the names of your medicines. And learn how and when to use them. Talk with your provider about other conditions you have, their treatment, and how it may affect your COPD.  Oxygen may be prescribed if tests show that your blood contains too little oxygen. Ask your provider about long-term oxygen therapy.  Coping tips  for shortness of breath include:  Exercise. Try to be as active as possible. This will improve energy levels and make your muscles stronger so you can do more. Ask your health care provider what exercises you can safely do.  Breathing methods.  Ask your provider or nurse to show you how to do pursed-lip breathing.  Balance rest and activity.  Each day, try to balance rest periods with activity. For example, you might start the day by getting dressed and eating breakfast. Then you can relax and read the paper. After that, take a brief walk. And then sit with your feet up for a while. Do the most important tasks when you have the most energy.  Pulmonary rehab (rehabilitation).  Community-based and home-based programs work as well as Psychologist, prison and probation services. This is true as long as they are as often and as intense. Standard home-based pulmonary rehab programs help with shortness of breath in people with COPD. Supervised, traditional pulmonary rehab remains the best choice for people with COPD. These programs help with managing your disease and also help with breathing methods, exercise, support, and counseling. To find one, ask your provider or call your local hospital. Talk with your provider about which rehab or self-management program is best for you.  Preventing a flare-up  Flare-ups happen. But the best way to treat a flare-up is to prevent it before it starts. Here are some pointers:   Don?t smoke or be around others who are smoking. Don't use e-cigarettes due to their harmful side effects.  Take your medicines as discussed with your health care provider.  Stay up-to-date on all vaccines. Talk with your provider about getting a flu shot every year. Also find out if you need a pneumonia shot.  If there is a weather advisory warning to stay indoors, try to stay inside when possible.  Try to eat healthy foods, exercise, and get plenty of sleep.  Try to stay away from things that normally cause your symptoms. These include dust, chemical fumes, hairsprays, or strong perfumes.  Follow-up care  Follow up with your health care provider as advised.   If a culture was done, you will be told if your treatment needs to be changed. You can call as directed for the results.   If X-rays were done, you will be told of any new findings that may affect your care.   During each appointment, talk with your provider about your ability to:   Cope in your normal environment.  Correctly use the inhaler (or your medicine delivery systems).  Cope with other conditions you have and their treatments and how they may affect your COPD.  Call 911  Call 911 if you:   Wheeze or have shortness of breath that does not get better with treatment.  Have chest pain or chest tightness.  Feel lightheaded or dizzy.  Have trouble breathing.  Feel confused or it?s hard to wake you up.  Faint or lose consciousness.  Have a rapid heart rate.  Have new pain in your chest, arm, shoulder, neck, or upper back.  Have blue lips or skin.  When to get medical advice  Call your health care provider right away if you:   Have a fever of 100.4?F (38?C) or higher, or as directed by your provider.  Cough up lots of dark-colored or bloody mucus (sputum).  Don't start to get better within 24 hours, or new symptoms start.  Have swelling of your ankles that gets worse.  Are weak.

## 2023-09-30 NOTE — Progress Notes
 Date of Service: 09/30/2023    Charles Walls is a 53 y.o. male.  DOB: 11/29/1970  MRN: [id number]     Subjective:             Chief Complaint   Patient presents with    Congestion     Chief Complaint: Heavy Chest congestion/Cough    Time Frame: started about two weeks ago    Additional Symptoms: bad chest, fever /chills, can't breathe , patient states chest rattles, patient states having body aches really bad , throat sore     Prior Treatment/Medications: OTC cold and flu, vicks vapor rub, cough syrup          History of Present Illness  History of Present Illness  Charles Walls is a 53 year old male with a history of HIV and emphysema who presents with worsening respiratory symptoms.    He has been experiencing symptoms for approximately two weeks, including cough, fever , shortness of breath, sore throat, and body aches. His fever resolved the night he visited the emergency room about a week ago. He describes his chest pain as severe, rating it ten out of ten, and notes that his shortness of breath has worsened since his initial visit to the ER.    He was diagnosed with a human rhinovirus infection following a nasal swab and other tests conducted during his ER visit. He has been using over-the-counter medications to manage his symptoms.    He has a history of emphysema but does not use inhalers regularly. He has used albuterol  from his ex-mother-in-law a few times, which provides minimal relief. He recently quit smoking five days ago, which he describes as challenging, especially while being sick.           Charles Walls has a past medical history of Abscess of skin of left wrist (02/05/2021), AIDS (acquired immune deficiency syndrome) (CMS-HCC) (2001), Allergy (Sulfa drugs), Anxiety, Arthritis (N/A), Back pain, Cancer (CMS-HCC) (01/19), Cardiac dysrhythmia (??), Cellulitis (05/31/2021), Cellulitis of buttock, left (05/31/2021), Cellulitis of left lower extremity (07/02/2020), Chest pain, Congestive heart disease (CMS-HCC), Coronary artery disease, Depression (2019), Depressive disorder, not elsewhere classified, Diverticulitis (02/02/2017), Dizziness (Years  ago), 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) (Had pastense), Fracture cervical vertebra-closed (CMS-HCC) (c6, c7 - no surgical repair), Gastroesophageal reflux disease without esophagitis (10/05/2019) (Ordered omeprazole  40mg  qday before breakfast.  Discussed lifestyle modifications. No current red flag symptoms.  Hx of PUD in high school), Gout, Heart murmur, Human immunodeficiency virus (HIV) disease (CMS-HCC), Hyperlipidemia (06/13/2013), Kidney disease (2019), Lung disease, Malignant neoplasm of urinary system (CMS-HCC), Melanoma (CMS-HCC), Other and unspecified hyperlipidemia, Other specified disorders of arteries and arterioles (2018), Pneumonia, Psoriasis, unspecified (02/21/2014), Pulmonary emphysema (CMS-HCC) (Dk), Sepsis (CMS-HCC) (03/20/2019), Sexually transmitted disease (???), Skin cancer, Suicide attempt (CMS-HCC), Tachyarrhythmia (June 2019), and Valvular heart disease (2019).    He has a past surgical history that includes laparoscopy surg rpr initial inguinal hernia (right); appendectomy (2008); Hernia repair; Colonoscopy (N/A, 06/17/2015) (COLONOSCOPY performed by Ledora Catalina, MD at [facility]); partial nephrectomy (Left, 06/09/2017) (ROBOT ASSISTED LAPAROSCOPIC NEPHRECTOMY PARTIAL WITH INTRAOPERATIVE ULTRASOUND  performed by Jackee Milan, MD at [facility]); lymph node biopsy (Left, 08/11/2020) (BIOPSY/ EXCISION LYMPH NODE DEEP CERVICAL performed by Cleola Bi, MD at [facility] OR); incision and drainage (N/A, 06/02/2021) (INCISION AND DRAINAGE ISCHIORECTAL/ PERIRECTAL ABSCESS performed by Marchelle Lamar BIRCH, MD at [facility] OR); echocardiogram procedure (2023); electrocardiogram (2023); cardiovascular stress test (2024); Kidney surgery (2019); lymph node dissection; skin biopsy; surgery; colonoscopy (  2018); and appendectomy (Agw 19).    [name]'s family history includes Aortic Disease/Dissection in his paternal uncle; Cancer in his father; Cancer-Breast in his maternal aunt; Cancer-Colon (age of onset: 28) in his maternal aunt; Cancer-Lung in his maternal uncle; Depression in his sister; Heart Disease in his paternal uncle; High Cholesterol in his mother; Melanoma in his father.        Review of Systems   Constitutional:  Negative for fever.   HENT:  Positive for sore throat.    Respiratory:  Positive for cough and shortness of breath.    Musculoskeletal:  Positive for myalgias.         Objective:          abacavir -dolutegravir -lamivud (TRIUMEQ ) 600-50-300 mg tablet Take 1 Tablet by mouth once daily    albuterol  sulfate (PROAIR  HFA) 90 mcg/actuation HFA aerosol inhaler Inhale two puffs by mouth into the lungs every 4 hours as needed for Wheezing or Shortness of Breath.    atorvastatin  (LIPITOR) 20 mg tablet Take 1 Tablet by mouth once daily    azithromycin  (ZITHROMAX  Z-PAK) 250 mg tablet Take two tablets by mouth daily for 1 day, THEN one tablet daily for 4 days.    furosemide  (LASIX ) 40 mg tablet Take one tablet by mouth every morning.    metoprolol  succinate XL (TOPROL  XL) 25 mg extended release tablet Take 1 Tablet by mouth daily.    potassium chloride  SR (KLOR-CON  M20) 20 mEq tablet Take one tablet by mouth daily. Take with a meal and a full glass of water . (Patient taking differently: Take one tablet by mouth every 48 hours. Take with a meal and a full glass of water .)    predniSONE  (DELTASONE ) 10 mg tablet Take 40mg  (4 tabs) on days 1-2, 30mg  (3 tabs) on days 3-4, 20mg  (2 tabs) on days 5-6 and 10mg  (1 tab) on day 7. Take in the morning.    sildenafiL  (VIAGRA ) 50 mg tablet TAKE 1 TABLET BY MOUTH AS NEEDED FOR ED    valACYclovir  (VALTREX ) 1 gram tablet Take two tablets by mouth twice daily. (Patient taking differently: Take two tablets by mouth as Needed.)     Vitals:    09/30/23 1508   BP: 106/63   Pulse: 97   Temp: 98.6 ?F (37 ?C) Resp: 16   SpO2: 100%   TempSrc: Oral   PainSc: Ten   Weight: 63.7 kg (140 lb 6.4 oz)   Height: 164.5 cm (5' 4.76)     Body mass index is 23.53 kg/m?SABRA     Physical Exam  Vitals and nursing note reviewed.   Constitutional:       General: He is not in acute distress.     Appearance: He is well-developed. He is not ill-appearing, toxic-appearing or diaphoretic.   HENT:      Head: Normocephalic.      Right Ear: Tympanic membrane, ear canal and external ear normal.      Left Ear: Tympanic membrane, ear canal and external ear normal.      Nose: Nose normal.      Mouth/Throat:      Mouth: Mucous membranes are moist.      Pharynx: Uvula midline. No oropharyngeal exudate or posterior oropharyngeal erythema.      Tonsils: No tonsillar exudate. 0 on the right. 0 on the left.   Eyes:      General: Lids are normal.      Extraocular Movements: Extraocular movements intact.      Conjunctiva/sclera:  Conjunctivae normal.   Cardiovascular:      Rate and Rhythm: Normal rate and regular rhythm.      Heart sounds: Normal heart sounds, S1 normal and S2 normal.   Pulmonary:      Effort: No accessory muscle usage, prolonged expiration or respiratory distress.      Breath sounds: Examination of the right-upper field reveals rhonchi. Examination of the left-upper field reveals rhonchi. Examination of the right-middle field reveals rhonchi. Examination of the left-middle field reveals rhonchi. Examination of the right-lower field reveals rhonchi. Examination of the left-lower field reveals rhonchi. Rhonchi present.      Comments: Labored breathing.     Patient reports moderate improvement in shortness of breath after Duo-Neb treatment. Breathing no longer labored.   Musculoskeletal:      Cervical back: Neck supple.   Lymphadenopathy:      Cervical: No cervical adenopathy.   Skin:     General: Skin is warm and dry.   Neurological:      Mental Status: He is alert and oriented to person, place, and time.   Psychiatric:         Attention and Perception: Attention normal.         Mood and Affect: Mood normal.         Speech: Speech normal.         Behavior: Behavior normal.         Thought Content: Thought content normal.         Cognition and Memory: Cognition normal.         Judgment: Judgment normal.              Assessment and Plan:  Belvie CHARLENA Medley was seen today for congestion.    Diagnoses and all orders for this visit:    COPD exacerbation (CMS-HCC)  -     albuterol -ipratropium (DUONEB) nebulizer solution 3 mL  -     predniSONE  (DELTASONE ) 10 mg tablet; Take 40mg  (4 tabs) on days 1-2, 30mg  (3 tabs) on days 3-4, 20mg  (2 tabs) on days 5-6 and 10mg  (1 tab) on day 7. Take in the morning.  -     azithromycin  (ZITHROMAX  Z-PAK) 250 mg tablet; Take two tablets by mouth daily for 1 day, THEN one tablet daily for 4 days.  -     albuterol  sulfate (PROAIR  HFA) 90 mcg/actuation HFA aerosol inhaler; Inhale two puffs by mouth into the lungs every 4 hours as needed for Wheezing or Shortness of Breath.    Shortness of breath  -     CHEST 2 VIEWS; Future; Expected date: 09/30/2023  -     PR PRESSURIZED/NONPRESSURIZED INHALATION TREATMENT         CHEST 2 VIEWS  Narrative: CHEST 2 VIEWS     INDICATION: SOB, cough x 2 weeks.    COMPARISON: 09/24/2023  Impression: IMPRESSION: Mild nodularity in the right mid and upper lung which could be infectious inflammatory. Otherwise no confluent consolidation. No pleural effusion or pneumothorax.    Normal cardiomediastinal silhouette.        Finalized by Kaitlin Marquis, M.D. on 09/30/2023 4:06 PM. Dictated by Erlinda Sartorius, M.D. on 09/30/2023 4:01 PM.            .  Patient Instructions   COPD Flare-Up  You have had a flare-up of your COPD.   COPD (chronic obstructive pulmonary disease) is a common lung disease. It causes your airways to get irritated and narrower. This makes it harder  for you to breathe. Emphysema and chronic bronchitis are both types of COPD. This is a long-term (chronic) condition. This means you always have it. But how severe it is may vary. When it gets worse, it's called a flare-up.     Symptoms of COPD  People with COPD may have symptoms most of the time. In a flare-up, your symptoms get worse. Symptoms that may mean you are having a flare-up include:   Shortness of breath, shallow or rapid breathing, or wheezing that gets worse.  Lung infection.  A cough that gets worse.  More mucus (or sputum), thicker mucus, or mucus of a different color.  Tiredness, less energy, or trouble doing your routine activities.  Fever.  Chest tightness.  Trouble talking.  You feel confused.  Causes of flare-ups  A flare-up can happen even if you did everything right and followed your health care provider?s instructions. Some causes of flare-ups are:   Cold weather.  Smoking or secondhand smoke.  Use of e-cigarettes or vaping products.  Colds, the flu, or other respiratory infections.  Air pollution.  Sudden change in the weather.  Dust, vapors, gases, irritating chemicals, or strong fumes.  Not taking your medicines as prescribed.  Indoor pollution, such as burning wood, smoke from home cooking, or heating fuels.  Home care  Here are some things you can do at home to treat a flare-up:   Keep calm and try not to panic. This makes it harder to breathe and keeps you from doing the right things.  Don?t smoke or be around others who are smoking. If you smoke, quit. Smoking is the main most common cause of COPD. Quitting will help you manage your COPD. Don't use e-cigarettes or vaping products either. Ask your health care provider about ways to help you quit smoking.  Before drinking extra fluids during flare-ups to loosen the mucus, always talk with your healthcare provider first.  Eat a healthy, balanced diet. It is important to stay as healthy as possible. So is trying to stay at your ideal weight. Being overweight or underweight can affect your health. Make sure you have a lot of fruits and vegetables every day. And also eat balanced portions of whole grains, lean meats and fish, and low-fat dairy products.  Use your inhalers and nebulizer, if you have one, as you have been told to. When using a metered dose inhaler or nebulizer, it's very important to use the correct methods. If you have any questions about how to use your device, contact your health care provider or refer to the user manual.  If you were given antibiotics, take them until they are used up or your provider tells you to stop. It?s important to finish the antibiotics even though you feel better. This will make sure the infection has cleared.  If you were given a steroid, use it for the length of time prescribed. This is important even if you feel better.  Learn the names of your medicines. And learn how and when to use them. Talk with your provider about other conditions you have, their treatment, and how it may affect your COPD.  Oxygen may be prescribed if tests show that your blood contains too little oxygen. Ask your provider about long-term oxygen therapy.  Coping tips for shortness of breath include:  Exercise. Try to be as active as possible. This will improve energy levels and make your muscles stronger so you can do more. Ask your health care provider what exercises  you can safely do.  Breathing methods.  Ask your provider or nurse to show you how to do pursed-lip breathing.  Balance rest and activity.  Each day, try to balance rest periods with activity. For example, you might start the day by getting dressed and eating breakfast. Then you can relax and read the paper. After that, take a brief walk. And then sit with your feet up for a while. Do the most important tasks when you have the most energy.  Pulmonary rehab (rehabilitation).  Community-based and home-based programs work as well as Psychologist, prison and probation services. This is true as long as they are as often and as intense. Standard home-based pulmonary rehab programs help with shortness of breath in people with COPD. Supervised, traditional pulmonary rehab remains the best choice for people with COPD. These programs help with managing your disease and also help with breathing methods, exercise, support, and counseling. To find one, ask your provider or call your local hospital. Talk with your provider about which rehab or self-management program is best for you.  Preventing a flare-up  Flare-ups happen. But the best way to treat a flare-up is to prevent it before it starts. Here are some pointers:   Don?t smoke or be around others who are smoking. Don't use e-cigarettes due to their harmful side effects.  Take your medicines as discussed with your health care provider.  Stay up-to-date on all vaccines. Talk with your provider about getting a flu shot every year. Also find out if you need a pneumonia shot.  If there is a weather advisory warning to stay indoors, try to stay inside when possible.  Try to eat healthy foods, exercise, and get plenty of sleep.  Try to stay away from things that normally cause your symptoms. These include dust, chemical fumes, hairsprays, or strong perfumes.  Follow-up care  Follow up with your health care provider as advised.   If a culture was done, you will be told if your treatment needs to be changed. You can call as directed for the results.   If X-rays were done, you will be told of any new findings that may affect your care.   During each appointment, talk with your provider about your ability to:   Cope in your normal environment.  Correctly use the inhaler (or your medicine delivery systems).  Cope with other conditions you have and their treatments and how they may affect your COPD.  Call 911  Call 911 if you:   Wheeze or have shortness of breath that does not get better with treatment.  Have chest pain or chest tightness.  Feel lightheaded or dizzy.  Have trouble breathing.  Feel confused or it?s hard to wake you up.  Faint or lose consciousness.  Have a rapid heart rate.  Have new pain in your chest, arm, shoulder, neck, or upper back.  Have blue lips or skin.  When to get medical advice  Call your health care provider right away if you:   Have a fever of 100.4?F (38?C) or higher, or as directed by your provider.  Cough up lots of dark-colored or bloody mucus (sputum).  Don't start to get better within 24 hours, or new symptoms start.  Have swelling of your ankles that gets worse.  Are weak.

## 2023-10-02 ENCOUNTER — Encounter: Admit: 2023-10-02 | Discharge: 2023-10-02 | Payer: PRIVATE HEALTH INSURANCE

## 2023-10-02 NOTE — Progress Notes
 Name: Charles Walls          MRN: [id number]      DOB: 01-14-1971      AGE: 53 y.o.   DATE OF SERVICE: 10/03/2023    Subjective:             Reason for Visit:  Follow Up      Charles Walls is a 53 y.o. male.  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      He currently has a cold, which he experenced with severe chest burning, leading to an ER visit where he was diagnosed with rhinovirus. He was prescribed azithromycin , an inhaler, and is on a prednisone  taper. Symptoms have improved slowly over three days, but is still feels and appears sick.    He has joint pain, particularly in his hands, which he attributes to arthritis from his previous occupation as a Interior and spatial designer for 25 years.    He has a history of MGUS, with previous lab results showing a normal free light chain ratio and no M spike. His kidney function has been a concern, with creatinine levels fluctuating slightly but remaining relatively stable since April 2024. He had kidney cancer in 2019, treated surgically.    He experiences foamy urine daily, even without taking Lasix , which he has been on for about a year for fluid management.  Urine studies recently have been negative for protein.      History of Present Illness  Patient was referred to the plasma cell disorder clinic for elevated free light chains and M-protein spike found on an electrophoresis test drawn by his primary care provider.  He is knowledgeable regarding the referral.  His health has been stable overall.  He does have significant shortness of breath, feeling winded with most activities.  He follows with cardiology for his bicuspid aortic valve and CHF.  Echo 09/02/2022 showed EF of 63% with IVS 1.20.       New to him within this last year is an elevated creatinine starting in April 2024.  He denies foamy urine at this time, but has had it intermittently in the past.  He started furosemide  in August 2024.       Additional medical history includes HDL; lymphadenopathy hyperplasia, renal cell carcinoma, and spondylosis of cervical joint.    Pt denies any history of military service, extensive exposure to pesticides, he did have an aunt with a blood cancer.        Review of Systems   Constitutional:  Negative for activity change, appetite change, diaphoresis, fever and unexpected weight change.   HENT:  Negative for congestion.    Eyes:  Negative for visual disturbance.   Respiratory:  Positive for shortness of breath.    Cardiovascular:  Positive for palpitations. Negative for leg swelling. Chest pain: feels sore in his chest.  Gastrointestinal:  Negative for blood in stool, constipation, diarrhea and nausea.   Genitourinary:  Negative for dysuria and frequency.   Musculoskeletal:  Positive for back pain (R hip sciatica.  R hip pain 3-4 days). Neck pain: cervical spine.  Skin:  Negative for rash.   Neurological:  Positive for dizziness (intermittent) and light-headedness. Negative for weakness and headaches.   Hematological:  Negative for adenopathy. Does not bruise/bleed easily.   Psychiatric/Behavioral:  Negative for agitation, behavioral problems and confusion.        Objective:          abacavir -dolutegravir -lamivud (TRIUMEQ ) 600-50-300 mg tablet Take 1 Tablet  by mouth once daily    albuterol  sulfate (PROAIR  HFA) 90 mcg/actuation HFA aerosol inhaler Inhale two puffs by mouth into the lungs every 4 hours as needed for Wheezing or Shortness of Breath.    atorvastatin  (LIPITOR) 20 mg tablet Take 1 Tablet by mouth once daily    azithromycin  (ZITHROMAX  Z-PAK) 250 mg tablet Take two tablets by mouth daily for 1 day, THEN one tablet daily for 4 days.    furosemide  (LASIX ) 40 mg tablet Take one tablet by mouth every morning.    metoprolol  succinate XL (TOPROL  XL) 25 mg extended release tablet Take 1 Tablet by mouth daily.    potassium chloride  SR (KLOR-CON  M20) 20 mEq tablet Take one tablet by mouth daily. Take with a meal and a full glass of water . (Patient taking differently: Take one tablet by mouth every 48 hours. Take with a meal and a full glass of water .)    predniSONE  (DELTASONE ) 10 mg tablet Take 40mg  (4 tabs) on days 1-2, 30mg  (3 tabs) on days 3-4, 20mg  (2 tabs) on days 5-6 and 10mg  (1 tab) on day 7. Take in the morning.    sildenafiL  (VIAGRA ) 50 mg tablet TAKE 1 TABLET BY MOUTH AS NEEDED FOR ED    valACYclovir  (VALTREX ) 1 gram tablet Take two tablets by mouth twice daily. (Patient taking differently: Take two tablets by mouth as Needed.)     Vitals:    10/03/23 0938   BP: 113/71   Pulse: 93   Temp: 36.4 ?C (97.6 ?F)   Resp: 16   SpO2: 97%   TempSrc: Temporal   PainSc: Seven   Weight: 64.1 kg (141 lb 6.4 oz)       Body mass index is 23.7 kg/m?SABRA     Pain Score: Seven  Pain Loc: Chest    Fatigue Scale: 6    Pain Addressed:  Current regimen working to control pain.    Patient Evaluated for a Clinical Trial: No treatment clinical trial available for this patient.     [place] Cooperative Oncology Group performance status is 1, Restricted in physically strenuous activity but ambulatory and able to carry out work of a light or sedentary nature, e.g., light house work, office work.     Physical Exam  Constitutional:       General: He is not in acute distress.     Appearance: Normal appearance. He is normal weight. He is ill-appearing. He is not toxic-appearing.   HENT:      Head: Normocephalic and atraumatic.   Pulmonary:      Effort: Pulmonary effort is normal. No respiratory distress.      Breath sounds: No wheezing.   Abdominal:      General: Abdomen is flat. There is no distension.   Musculoskeletal:         General: Normal range of motion.      Cervical back: Normal range of motion.      Right lower leg: No edema.      Left lower leg: No edema.   Lymphadenopathy:      Head:      Right side of head: No submental, submandibular, tonsillar, preauricular, posterior auricular or occipital adenopathy.      Left side of head: No submental, submandibular, tonsillar, preauricular, posterior auricular or occipital adenopathy.      Upper Body:      Right upper body: No supraclavicular or axillary adenopathy.      Left upper body: No supraclavicular or axillary  adenopathy.   Skin:     General: Skin is warm and dry.      Findings: No erythema or rash.   Neurological:      General: No focal deficit present.      Mental Status: He is alert and oriented to person, place, and time. Mental status is at baseline.   Psychiatric:         Mood and Affect: Mood normal.         Behavior: Behavior normal.         Thought Content: Thought content normal.         Judgment: Judgment normal.           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    Elevated creatine, 1.34 only CRAB criteria present, possibly secondary to dehydration and furosemide  use.  Pt denies B-cell symptoms.      10/03/2023: Patient's IgG MGUS markers are stable, showing some improvement with FKLC 5.99, FLLC 3.76, FKLC/FLLC ratio 1.59, serum M prtn 0.  No new crab criteria, creatinine stable.  No concern for progression at this time      Recommendation:  RTC with labs in six months, will change to annual following that visit if labs remain stable.        Eleanor FREDRIK Bame, APRN-NP  Division of Hematological Malignancies and [employer]  Collaborating Physician: Dr. Legrand   Pager [phone]

## 2023-10-03 ENCOUNTER — Encounter: Admit: 2023-10-03 | Discharge: 2023-10-03 | Payer: PRIVATE HEALTH INSURANCE

## 2023-10-03 VITALS — BP 113/71 | HR 93 | Temp 97.60000°F | Resp 16 | Wt 141.4 lb

## 2023-10-03 DIAGNOSIS — D472 Monoclonal gammopathy: Principal | ICD-10-CM

## 2023-10-03 DIAGNOSIS — R7989 Other specified abnormal findings of blood chemistry: Secondary | ICD-10-CM

## 2023-10-03 MED ORDER — SILDENAFIL 50 MG PO TAB
ORAL_TABLET | ORAL | 2 refills | 45.00000 days | Status: AC
Start: 2023-10-03 — End: ?

## 2023-10-03 NOTE — Telephone Encounter
 Last seen 04/07      Last Filled 07/11 #20-2 refills    Next appt 10/07

## 2023-10-17 ENCOUNTER — Encounter: Admit: 2023-10-17 | Discharge: 2023-10-17 | Payer: PRIVATE HEALTH INSURANCE

## 2023-10-17 DIAGNOSIS — E782 Mixed hyperlipidemia: Principal | ICD-10-CM

## 2023-10-17 MED ORDER — ATORVASTATIN 20 MG PO TAB
20 mg | ORAL_TABLET | Freq: Every day | ORAL | 0 refills | 90.00000 days | Status: AC
Start: 2023-10-17 — End: ?

## 2023-10-20 ENCOUNTER — Encounter: Admit: 2023-10-20 | Discharge: 2023-10-20 | Payer: PRIVATE HEALTH INSURANCE

## 2023-10-20 DIAGNOSIS — Z87891 Personal history of nicotine dependence: Principal | ICD-10-CM

## 2023-10-20 NOTE — Telephone Encounter
 Received vm from patient. Patient states that he is ready to take nicotine  test and get surgery scheduled for hernia repair.     Spoke with patient. Patient verbalizes he quit smoking around 09/23/23 and is ready to take the Nicotine  test. Informed patient urine Cotinine test has been ordered and can be completed at his convenience in the outpatient lab. Patient states he was seen in ED on 09/24/23 for pneumonia. Patient reports in mid September while cough was still present, he felt a pop in his lower left abdomen. Patient unsure if this is another hernia. Patient denies protruding or pain in this area and that this was a single occurrence. Informed patient this RN would check with the provider to see if any f/u needed before scheduling surgery. Patient will let us  know once the Cotinine test has been completed. No further questions at this time.

## 2023-11-08 ENCOUNTER — Encounter: Admit: 2023-11-08 | Discharge: 2023-11-08 | Payer: PRIVATE HEALTH INSURANCE

## 2023-11-08 NOTE — Progress Notes
 Date of Service: 11/09/2023    Charles Walls is a 53 y.o. male here for an annual physical.  Chief Complaint   Patient presents with    Physical    Labs Only     Fasting    Runny Nose     Onset Sat night    Sore Throat     Onset Sat night    Cough     Onset Sat night. He has not tested for COVID       Subjective:               History of Present Illness  DDS visits: Biannually.  Eye exams: Annually.  Exercise: Walks dog  Diet: Regular     Patient has the following concerns today:     URI symptoms: Onset was 4 days ago. Reports runny nose, sneezing, sore throat (better), and cough. Reports symptoms are mild. No fever or chills. No wheezing/SOA.     Chronic conditions:     Renal carcinoma: Diagnosed in 2019. He underwent a partial nephrectomy. No radiation or chemotherapy indicated.     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 On Triumeq . Last seen 09/28/2023.     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 He is asymptomatic. Has some SOA thought to be related to cardiac as albuterol  was not helpful. Stopped smoking on 10/5 and feels breathing is doing well. He can walk his dog without getting SOA.     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 Previously seen by [facility] in 2022. He has been sober since January 2023. Mood remains stable.     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 Started on furosemide  to help with chronic dyspnea. Also on metoprolol . Last seen by Cardiology in June.     Hyperlipidemia: Takes atorvastatin  20 mg daily.  Lab Results   Component Value Date    CHOL 160 06/14/2023    TRIG 128 06/14/2023    HDL 38 (L) 06/14/2023    LDL 117 (H) 06/14/2023    VLDL 25.6 06/14/2023    NONHDLCHOL 122 06/14/2023            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)    Fracture cervical vertebra-closed (CMS-HCC)    Gastroesophageal reflux disease without esophagitis    Gout    Heart murmur    Human immunodeficiency virus (HIV) disease (CMS-HCC)    Hyperlipidemia    Kidney disease    Lung disease    Malignant neoplasm of urinary system (CMS-HCC)    Melanoma (CMS-HCC)    Other and unspecified hyperlipidemia    Other specified disorders of arteries and arterioles    Pneumonia    Psoriasis, unspecified    Pulmonary emphysema (CMS-HCC)  Sepsis (CMS-HCC)    Sexually transmitted disease    Skin cancer    Suicide attempt (CMS-HCC)    Tachyarrhythmia    Valvular heart disease     Surgical History:   Procedure Laterality Date    COLONOSCOPY N/A 06/17/2015    Performed by Ledora Catalina, MD at [facility] ENDO    ROBOT ASSISTED LAPAROSCOPIC NEPHRECTOMY PARTIAL WITH INTRAOPERATIVE ULTRASOUND  Left 06/09/2017    Performed by Jackee Milan, MD at [facility] OR    BIOPSY/ EXCISION LYMPH NODE DEEP CERVICAL Left 08/11/2020    Performed by Cleola Bi, MD at [facility] OR    INCISION AND DRAINAGE ISCHIORECTAL/ PERIRECTAL ABSCESS N/A 06/02/2021    Performed by Marchelle Lamar BIRCH, MD at [facility] OR    APPENDECTOMY  Agw 19    CARDIOVASCULAR STRESS TEST  2024    COLONOSCOPY  2018    ECHOCARDIOGRAM PROCEDURE  2023    ELECTROCARDIOGRAM  2023    HERNIA REPAIR      HX APPENDECTOMY  2008    HX SKIN BIOPSY      KIDNEY SURGERY  2019    LYMPH NODE DISSECTION      PR LAPAROSCOPY SURG RPR INITIAL INGUINAL HERNIA      right    SURGERY       Family History   Problem Relation Name Age of Onset    High Cholesterol Mother Mom     Stroke Mother Mom     Dementia Mother Mom     Melanoma Father Dad JEFFORY SNELGROVE     Cancer Father Dad Larnell BIRCH Medley         Oral mandibular    Depression Sister Almarie Favorite         N/A    Hypertension Brother      Diabetes Brother      Cancer-Colon Maternal Aunt Georgia  Rollf 70    Cancer-Breast Maternal Aunt Georgia  Rollf     Cancer-Lung Maternal Uncle Ezzard lundborg     Aortic Disease/Dissection Paternal Uncle Richey Doolittle         Valve replacement    Heart Disease Paternal Uncle Leeman Johnsey     Cancer-Ovarian Neg Hx      Cancer-Prostate Neg Hx       Social History     Socioeconomic History    Marital status: Divorced    Number of children: 0   Occupational History    Occupation: unemployed   Tobacco Use    Smoking status: Former     Current packs/day: 0.00     Average packs/day: 2.2 packs/day for 52.9 years (118.9 ttl pk-yrs)     Types: Cigarettes     Start date: 50     Quit date: 09/27/2023     Years since quitting: 0.1     Passive exposure: Current    Smokeless tobacco: Former    Tobacco comments:     Cutting down slowly.    Vaping Use    Vaping status: Never Used   Substance and Sexual Activity    Alcohol use: Not Currently     Comment: Sober since 2018    Drug use: Not Currently     Types: Methamphetamines     Comment: Last used meth in January 2023    Sexual activity: Yes     Partners: Male     Birth control/protection: Condom, None   Social History Narrative    Pagan       Allergies[1]  Review of Systems   Constitutional:  Negative for chills, fatigue, fever and unexpected weight change.   HENT:  Positive for postnasal drip, rhinorrhea and sore throat. Negative for congestion, ear pain, hearing loss, nosebleeds and trouble swallowing.    Eyes:  Negative for visual disturbance.   Respiratory:  Positive for cough. Negative for shortness of breath and wheezing.    Cardiovascular:  Negative for chest pain, palpitations and leg swelling.   Gastrointestinal:  Negative for abdominal pain, blood in stool, constipation, diarrhea, nausea and vomiting.   Endocrine: Negative for polydipsia, polyphagia and polyuria.   Genitourinary:  Negative for difficulty urinating, dysuria, frequency, hematuria, scrotal swelling, testicular pain and urgency.   Musculoskeletal:  Negative for arthralgias, back pain and neck pain.   Skin:  Negative for rash.   Allergic/Immunologic: Negative for immunocompromised state.   Neurological:  Negative for dizziness, tremors, syncope, weakness, light-headedness, numbness and headaches.   Hematological:  Negative for adenopathy. Does not bruise/bleed easily.   Psychiatric/Behavioral:  Negative for dysphoric mood, self-injury, sleep disturbance and suicidal ideas. The patient is not nervous/anxious.    All other systems reviewed and are negative.        Objective:          abacavir -dolutegravir -lamivud (TRIUMEQ ) 600-50-300 mg tablet Take 1 Tablet by mouth once daily    atorvastatin  (LIPITOR) 20 mg tablet Take one tablet by mouth daily.    furosemide  (LASIX ) 40 mg tablet Take one tablet by mouth every morning.    metoprolol  succinate XL (TOPROL  XL) 25 mg extended release tablet Take 1 Tablet by mouth daily.    potassium chloride  SR (KLOR-CON  M20) 20 mEq tablet Take one tablet by mouth daily. Take with a meal and a full glass of water .    sildenafiL  (VIAGRA ) 50 mg tablet TAKE 1 TABLET BY MOUTH AS NEEDED FOR ED    valACYclovir  (VALTREX ) 1 gram tablet Take two tablets by mouth twice daily.     Medications Discontinued During This Encounter   Medication Reason    atorvastatin  (LIPITOR) 20 mg tablet Reorder       Vitals:    11/09/23 1100   BP: 106/64   BP Source: Arm, Left Upper   Pulse: 89   Temp: 36.7 ?C (98.1 ?F)   SpO2: 98%   TempSrc: Temporal   PainSc: Zero   Weight: 65.8 kg (145 lb)   Height: 162 cm (5' 3.78)     Body mass index is 25.06 kg/m?SABRA     Physical Exam  Vitals and nursing note reviewed.   Constitutional:       General: He is awake.      Appearance: Normal appearance. He is well-groomed.   HENT:      Head: Normocephalic and atraumatic.      Right Ear: Tympanic membrane, ear canal and external ear normal.      Left Ear: Tympanic membrane, ear canal and external ear normal.      Nose: Rhinorrhea present. No congestion. Rhinorrhea is clear.      Right Sinus: No maxillary sinus tenderness or frontal sinus tenderness.      Left Sinus: No maxillary sinus tenderness or frontal sinus tenderness.      Mouth/Throat:      Lips: Pink. No lesions.      Mouth: Mucous membranes are moist.      Pharynx: Oropharynx is clear. Postnasal drip present. No oropharyngeal exudate or posterior oropharyngeal erythema.   Eyes:      General: Lids  are normal. Lids are everted, no foreign bodies appreciated. No visual field deficit.        Right eye: No discharge.         Left eye: No discharge.      Extraocular Movements: Extraocular movements intact.      Conjunctiva/sclera: Conjunctivae normal.      Right eye: Right conjunctiva is not injected. No exudate or hemorrhage.     Left eye: Left conjunctiva is not injected. No exudate or hemorrhage.     Pupils: Pupils are equal, round, and reactive to light.   Neck:      Thyroid: No thyroid mass, thyromegaly or thyroid tenderness.      Vascular: No carotid bruit or JVD.   Cardiovascular:      Rate and Rhythm: Normal rate and regular rhythm.      Heart sounds: Normal heart sounds, S1 normal and S2 normal. Heart sounds not distant. No murmur heard.  Pulmonary:      Effort: Pulmonary effort is normal. No respiratory distress.      Breath sounds: Normal breath sounds. No stridor. No wheezing, rhonchi or rales.   Chest:      Chest wall: No deformity.   Abdominal:      General: Abdomen is flat. Bowel sounds are normal. There is no distension.      Palpations: Abdomen is soft. There is no mass.      Tenderness: There is no abdominal tenderness. There is no guarding.      Hernia: No hernia is present.   Musculoskeletal:         General: No deformity or signs of injury. Normal range of motion.      Cervical back: Normal range of motion and neck supple. No muscular tenderness.      Right lower leg: No edema.      Left lower leg: No edema.   Lymphadenopathy:      Cervical: No cervical adenopathy.      Upper Body:      Right upper body: No supraclavicular adenopathy.      Left upper body: No supraclavicular adenopathy.   Skin:     General: Skin is warm and dry.      Capillary Refill: Capillary refill takes less than 2 seconds.      Findings: No erythema, lesion or rash.   Neurological:      General: No focal deficit present.      Mental Status: He is alert and oriented to person, place, and time.      Cranial Nerves: No cranial nerve deficit.      Sensory: No sensory deficit.      Motor: No weakness.      Coordination: Coordination normal.      Gait: Gait normal.      Deep Tendon Reflexes: Reflexes normal.   Psychiatric:         Mood and Affect: Mood normal.         Behavior: Behavior normal. Behavior is cooperative.         Thought Content: Thought content normal.         Judgment: Judgment normal.         Depression:  Patient Scores:  PHQ-2: PHQ-2 Score: 0 (11/09/2023 11:02 AM)    PHQ-9: PHQ-9 Score: 0 (11/09/2023 11:02 AM)    Interventions:  PHQ-2: PHQ-2 Score less than 3: No follow-up or recommendations are necessary at this time (07/27/2023 12:59 PM)    Depression Interventions PHQ-2/9:  No data recorded    BMI:  Body mass index is 25.06 kg/m?SABRA  No Follow-Up: No follow-up action or recommendations are necessary at this time (04/25/2023  4:57 PM)      Falls:  Fall History (last 24mo): No Falls (10/03/2023  9:38 AM)  Fall Risk: None identified (10/03/2023  9:38 AM)      No results found for this or any previous visit (from the past 2 weeks).    Health Maintenance Summary    -      Completed or No Longer Recommended     COLORECTAL CANCER SCREENING  Discontinued      Frequency changed to Never automatically (Topic No Longer Applies)    06/17/2015  Surgical Procedure: COLONOSCOPY    06/17/2015  COLONOSCOPY                             Assessment and Plan:        Belvie CHARLENA Medley was seen today for physical, labs only, runny nose, sore throat and cough.    Diagnoses and all orders for this visit:    Routine general medical examination at a health care facility  Fasting labs drawn at today's visit (ordered by various other providers).  Reviewed the importance of healthy diet and regular exercise routine.   Encouraged the continued participation in preventative medicine for the early detection of disease and improved health.   Provided education (see patient AVS)  regarding: Annual eye and dental exams, daily exercise, healthy diet, mole monitoring/skin checks, avoidance of tobacco products, and limiting alcohol.   Health Maintenance items reviewed. Health maintenance items UTD at this time.     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 He will continue to follow with [facility].    HIV infection, unspecified symptom status (CMS-HCC)  Chronic. Continue to follow with [facility] for management.    Pulmonary emphysema (CMS-HCC)  Chronic. Asymptomatic.     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    MDD (major depressive disorder), recurrent, in full remission  Mood remains stable. No concerns.    Mixed hyperlipidemia  -     atorvastatin  (LIPITOR) 20 mg tablet; Take one tablet by mouth daily.  Chronic. Takes atorvastatin  20 mg daily.   Recheck lab today as ordered by cardiology.    Congestive heart failure, NYHA class II, chronic, diastolic (CMS-HCC)  Chronic. Continue to follow with [facility].    Screening for malignant neoplasm of prostate  -     PSA SCREEN; Future; Expected date: 11/09/2023  PSA with lab.    Viral URI with cough  Mild symptoms. Exam reassuring.  Advised the patient that symptoms are likely viral and require time to run their course.   Recommended rest, fluids, and OTC decongestants/analgesics for symptom relief.   Instructed the patient to call if symptoms worsen or do not improve in the next 5-7 days.               Patient education provided regarding diagnosis, course, and treatment. Patient was in agreement to instructions, POC, and will call if questions or concerns arise.     Total Time Today was 25 minutes in the following activities: Preparing to see the patient, Obtaining and/or reviewing separately obtained history, Performing a  medically appropriate examination and/or evaluation, Counseling and educating the patient/family/caregiver, Ordering medications, tests, or procedures, Documenting clinical information in the electronic or other health record, and Independently interpreting results (not separately reported) and communicating results to the patient/family/caregiver      Return in about 1 year (around 11/08/2024) for physical exam.      Future Appointments   Date Time Provider Department Center   11/09/2023  1:00 PM [facility] None   01/04/2024 10:00 AM El Jeraline Klein I, MD [facility]   03/26/2024  1:00 PM PHLEBOTOMIST IN LAB LEVEL 2 [facility] Ex[facility]   04/02/2024 10:30 AM Tess, Eleanor CROME, APRN-NP [facility] [place] Exam          [1]   Allergies  Allergen Reactions    Sulfa (Sulfonamide Antibiotics) SEE COMMENTS     Patient reports that his father and brother are allergic, he is unsure so he reports it as an allergy.

## 2023-11-09 ENCOUNTER — Encounter: Admit: 2023-11-09 | Discharge: 2023-11-09 | Payer: PRIVATE HEALTH INSURANCE

## 2023-11-09 ENCOUNTER — Ambulatory Visit: Admit: 2023-11-09 | Discharge: 2023-11-09 | Payer: PRIVATE HEALTH INSURANCE

## 2023-11-09 VITALS — BP 106/64 | HR 89 | Temp 98.10000°F | Ht 63.78 in | Wt 145.0 lb

## 2023-11-09 DIAGNOSIS — C8591 Non-Hodgkin lymphoma, unspecified, lymph nodes of head, face, and neck: Secondary | ICD-10-CM

## 2023-11-09 DIAGNOSIS — Z21 Asymptomatic human immunodeficiency virus [HIV] infection status: Secondary | ICD-10-CM

## 2023-11-09 DIAGNOSIS — J439 Emphysema, unspecified: Secondary | ICD-10-CM

## 2023-11-09 DIAGNOSIS — C642 Malignant neoplasm of left kidney, except renal pelvis: Secondary | ICD-10-CM

## 2023-11-09 DIAGNOSIS — Z0000 Encounter for general adult medical examination without abnormal findings: Secondary | ICD-10-CM

## 2023-11-09 DIAGNOSIS — F1510 Other stimulant abuse, uncomplicated: Secondary | ICD-10-CM

## 2023-11-09 DIAGNOSIS — I5032 Chronic diastolic (congestive) heart failure: Secondary | ICD-10-CM

## 2023-11-09 DIAGNOSIS — J069 Acute upper respiratory infection, unspecified: Secondary | ICD-10-CM

## 2023-11-09 DIAGNOSIS — E782 Mixed hyperlipidemia: Secondary | ICD-10-CM

## 2023-11-09 DIAGNOSIS — F3342 Major depressive disorder, recurrent, in full remission: Secondary | ICD-10-CM

## 2023-11-09 DIAGNOSIS — D472 Monoclonal gammopathy: Secondary | ICD-10-CM

## 2023-11-09 DIAGNOSIS — Z125 Encounter for screening for malignant neoplasm of prostate: Principal | ICD-10-CM

## 2023-11-09 MED ORDER — ATORVASTATIN 20 MG PO TAB
20 mg | ORAL_TABLET | Freq: Every day | ORAL | 3 refills | 90.00000 days | Status: AC
Start: 2023-11-09 — End: ?

## 2023-11-09 NOTE — Patient Instructions
 It was a pleasure to see you today and take part in your care.     Notes from today's visit:  Please see attached documents for tips on mole monitoring, preventative care screenings, and additional information about living your healthiest life!  Labs: Your labs will be available for you to view on MyChart as early as 8 hours from the time they are drawn. Please allow me 2-3 business days to review all results and post a results message to you. I typically wait until all results are back before reviewing. If anything is critical, I will be promptly paged and you will be notified. I appreciate your patience and understanding!    Patient satisfaction is essential to our success. You may be contacted by the Medical Practice Survey in the coming days for a survey and your feedback is very important to us . Feedback helps us  identify our strengths and weaknesses in relationship to the goals we have set for ourselves. Responses of VERY GOOD (5) are viewed as positive. If you do not feel we reached this level of service please share your concerns with myself and/or our Engineer, manufacturing.      *If you had to wait on me today, please accept my sincerest apologies. I know your time is very valuable and I want to honor it. It always my best intention to run on time, however on occasion, I do run behind due to unexpected issues that arise with patients.     Have a delightful day,   D'Arcy Abraha, APRN        Prevention Guidelines, Men Ages 56 to 45  Screening tests and vaccines are an important part of managing your health. A screening test is done to find diseases in people who don't have any symptoms. The goal is to find a disease early so lifestyle changes and checkups can reduce the risk of disease. Or the goal may be to detect it early to treat it most effectively. Screening tests are not used to diagnose a disease. But they are used to see if more testing is needed. Health counseling is important, too. Below are guidelines for these, for men ages 66 to 24. Keep in mind that screening recommendations vary among expert groups. Talk with your healthcare provider about which tests are best for you and to make sure you?re up-to-date on what you need.   Screening  Who needs it  How often    Unhealthy alcohol use  All men in this age group  At routine exams   Blood pressure All men in this age group  Yearly checkup if your blood pressure is normal   Normal blood pressure is 120/80 mm Hg   If your blood pressure reading is higher than normal, follow the advice of your healthcare provider    Colorectal cancer All men at average risk in this age group  Multiple tests are available and are used at different times. Possible tests include:   Colonoscopy every 10 years, or  Flexible sigmoidoscopy every 5 years (or every 10 years with yearly FIT stool test), or  CT colonography (virtual colonoscopy) every 5 years, or  Yearly fecal occult blood test, or  Yearly stool fecal immunochemical test (FIT) , or  Stool DNA test, every 1 to 3 years  If you choose a test other than a colonoscopy and have an abnormal test result, you will need to follow-up with a colonoscopy. Talk with your healthcare provider about which tests are best for you.  Some people should be screened using a different schedule because of their personal or family health history. Talk with your healthcare provider about your health history.    Depression All men in this age group  At routine exams   Type 2 diabetes or prediabetes  All men beginning at age 17 and men without symptoms at any age who are overweight or obese and have 1 or more other risk factors for diabetes  At least every 3 years (yearly if your blood sugar has already begun to rise)    Type 2 diabetes All men with prediabetes  Every year   Hepatitis C Men at increased risk for infection; 1 time for those born between 1945 and 1965  At routine exams; talk with your healthcare provider.    High cholesterol or triglycerides All men in this age group  At least every 4 to 6 years; talk with your healthcare provider about your risk and whether it should be tested more often    HIV All males up to age 72 and men at increased risk.  At least once up to age 11 at routine exams; talk with your healthcare provider if you are at risk    Lung cancer Men between ages 18 and 64 who are in fairly good health and are at higher risk for lung cancer:   Currently smoke or who have quit within past 15 years  20-pack year smoking history, or 1 pack/day for 20 years or 2 packs/day for 10 years  Expert groups vary a bit in their recommendations so talk with your provider  Yearly lung cancer screening with a low-dose CT scan (LDCT); talk with your healthcare provider    Obesity All men in this age group  At yearly routine exams    BMI (body mass index) All men in this age group Every year, to help find out if you are at a healthy weight for your height    Prostate cancer Starting at age 23, talk with your healthcare provider about risks and benefits of testing with digital rectal exam (DRE) and prostate-specific antigen (PSA) screening  At routine exams if you decide to be tested.    Syphilis Men at increased risk for infection  At routine exams; talk with your healthcare provider    Tuberculosis Men at increased risk for infection  Talk with your healthcare provider    Vision All men in this age group  Talk with your healthcare provider   Vaccine  Who needs it  How often    Chickenpox (varicella)  All men in this age group who have no record of this infection or vaccine  2 doses; second dose should be given at least 4 weeks after the first dose    Hepatitis A Men at increased risk for infection  2 or 3 doses (depending on the vaccine) given at least 6 months apart; talk with your healthcare provider    Hepatitis B Men at increased risk for infection  2 or 3 doses (depending on the vaccine) second dose should be given 1 month after the first dose; if a third dose , it should be given at least 2 months after the second dose and at least 4 months after the first dose; talk with your healthcare provider    Haemophilus influenzae Type B (HIB)  Men at increased risk for infection  1 or 3 doses; talk with your healthcare provider    Influenza (flu) All men in this age  group  Once a year   COVID-19 All men in this age group  1 to 2 doses depending on vaccine; talk with your healthcare provider    Measles, mumps, rubella (MMR)  Men in this age group born in [birth year] or later who have no record of these infections or vaccines  1 or 2 doses; talk with your healthcare provider    Meningococcal ACWY (MenACWY)  Men at increased risk for infection  1 or 2 doses depending on your case. Then a booster every 5 years if you are still at risk. Talk with your healthcare provider.    Meningococcal B (MenB) Men at increased risk for infection 2 or 3 doses, depending on the vaccine and your case; talk with your healthcare provider    Pneumococcal conjugate vaccine (PCV13) and pneumococcal polysaccharide vaccine (PPSV23)  Men at increased risk for infection  PCV13: 1 dose ages 58 to 76 (protects against 13 types of pneumococcal bacteria)   PPSV23: 1 to 2 doses through age 26, (protects against 23 types of pneumococcal bacteria)    Tetanus/diphtheria/pertussis (Td/Tdap) booster All men in this age group  1 dose if Tdap, then Td or Tdap booster every 10 years    Recombinant zoster vaccine (RZV) (Shingrix)  All men in this age group  2 doses; the 2nd dose is given 2 to 6 months after the first. This is given even if you've had shingles before, not sure if you have had chickenpox, or had a previous zoster live vaccine    Counseling  Who needs it  How often    Diet and exercise Men who are overweight or obese  When diagnosed, and then at routine exams    Sexually transmitted infection prevention  Men at increased risk for infection  At routine exams; talk with your healthcare provider    Use of daily aspirin  Men who have a history of heart attack or stroke may be prescribed daily aspirin. Experts vary on their recommendations on whether it should be given routinely  At routine exams; talk with your healthcare provider about the risks vs. benefits for you    Use of statin medicines for cholesterol Men between the ages of 36 and 25 years who have   An LDL-C level of more than 70 mg/dL but less than 809 mg/dL, no diabetes and borderline to high level of risk  An LDL-C level of 190 mg/dL or greater  A diagnosis of diabetes and LDL-C level of greater than 70mg /dL At routine exams, or more often as directed by your healthcare provider. Statin dosages may vary based on your overall health, risk factors, and other health conditions such as diabetes. Talk with your healthcare provider about your risk .    Use of tobacco and the health effects it can cause  All men in this age group  Every exam   [employer] last reviewed this educational content on 05/19/2019  ? 2000-2021 [employer], [place]. All rights reserved. This information is not intended as a substitute for professional medical care. Always follow your healthcare professional's instructions.

## 2023-12-20 ENCOUNTER — Ambulatory Visit: Admit: 2023-12-20 | Discharge: 2023-12-20 | Payer: PRIVATE HEALTH INSURANCE

## 2023-12-20 ENCOUNTER — Ambulatory Visit: Admit: 2023-12-20 | Discharge: 2023-12-21 | Payer: PRIVATE HEALTH INSURANCE

## 2023-12-20 ENCOUNTER — Ambulatory Visit: Admit: 2023-12-20 | Discharge: 2023-12-20 | Payer: MEDICAID

## 2023-12-20 ENCOUNTER — Encounter: Admit: 2023-12-20 | Discharge: 2023-12-20 | Payer: PRIVATE HEALTH INSURANCE

## 2023-12-21 ENCOUNTER — Encounter: Admit: 2023-12-21 | Discharge: 2023-12-21 | Payer: PRIVATE HEALTH INSURANCE

## 2023-12-21 DIAGNOSIS — E782 Mixed hyperlipidemia: Principal | ICD-10-CM

## 2023-12-21 MED ORDER — ATORVASTATIN 40 MG PO TAB
40 mg | ORAL_TABLET | Freq: Every evening | ORAL | 1 refills | 90.00000 days | Status: AC
Start: 2023-12-21 — End: ?

## 2023-12-21 NOTE — Telephone Encounter [36]
-----   Message from Crewe U sent at 12/21/2023  9:12 AM CST -----    ----- Message -----  From: Vertie Amelie BROCKS, MD  Sent: 12/21/2023   9:09 AM CST  To: Jacquline Maine, RN    lets go 46 QHS  Thanks  ----- Message -----  From: Maine Jacquline, RN  Sent: 12/21/2023   8:33 AM CST  To: Amelie BROCKS Vertie, MD    Dr. Lonnie,     Pt taking Atorvastatin  20 mg nightly. Target LDL<55. Thanks, Jacquline RN  ----- Message -----  From: Carrie American, RN  Sent: 12/21/2023   7:59 AM CST  To: [facility] Nurse Interventional Team [place]      ----- Message -----  From: Leverne Malm, Background User  Sent: 12/20/2023   3:24 PM CST  To: [facility] Nurse Lab Results

## 2023-12-21 NOTE — Telephone Encounter [36]
 Called patient to discuss Dr. Doreatha recommendations:    lets go 40 QHS  Thanks    Patient agreeable, new prescription sent to patient's preferred pharmacy.

## 2023-12-23 ENCOUNTER — Encounter: Admit: 2023-12-23 | Discharge: 2023-12-23 | Payer: PRIVATE HEALTH INSURANCE

## 2023-12-23 ENCOUNTER — Ambulatory Visit: Admit: 2023-12-23 | Discharge: 2023-12-24 | Payer: PRIVATE HEALTH INSURANCE

## 2023-12-23 VITALS — BP 119/83 | HR 103 | Temp 97.90000°F | Resp 18 | Wt 145.0 lb

## 2023-12-23 DIAGNOSIS — S0181XA Laceration without foreign body of other part of head, initial encounter: Principal | ICD-10-CM

## 2023-12-23 NOTE — Progress Notes [1]
 Charles Walls is a 53 y.o. male.    Chief Complaint:  Chief Complaint   Patient presents with    Head Injury     Pt hit his head on a pole in the store a few hours ago. Fire department told him to come here for stitches.     No loss of consciousness. Pt feels a bit nauseous and has a headache. Denies vision changes.        History of Present Illness:  HPI    History of Present Illness  KEVAN PROUTY is a 53 year old male who presents with a head injury after hitting his head on a pole.    Approximately two hours prior to the visit, he hit his head on a pole, resulting in a scalp laceration. He describes the injury as a 'big old loose egg' on his scalp, which was initially bleeding. He experiences a headache and had initial nausea, which he attributes to not having eaten in several hours. No changes in vision or current nausea.    He recalls standing at a store when he realized he was bleeding and requested paper towels to manage the bleeding. Initially ignored by a store employee, he eventually received assistance from another employee.    He has a history of head injuries requiring glue treatment in the past. He also reports a history of tetanus vaccination within the last five years after cutting himself with a saw and broken glass.    His past medical history includes treated kidney cancer, a hernia, and a rotator cuff issue that required surgical intervention. He takes Tylenol , ibuprofen, and acetaminophen  as needed. He quit smoking in October to improve his surgical outcomes.       Review of Systems:  Review of Systems   Constitutional:  Negative for chills and fever.   Eyes:  Negative for photophobia and visual disturbance.   Skin:  Positive for wound (laceration to right forehead above eyebrow).   Allergic/Immunologic: Positive for immunocompromised state.   Neurological:  Positive for headaches.       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)    Fracture cervical vertebra-closed (CMS-HCC)    Gastroesophageal reflux disease without esophagitis    Gout    Heart murmur    Human immunodeficiency virus (HIV) disease (CMS-HCC)    Hyperlipidemia    Kidney disease    Lung disease    Malignant neoplasm of urinary system (CMS-HCC)    Melanoma (CMS-HCC)    Other and unspecified hyperlipidemia    Other specified disorders of arteries and arterioles    Pneumonia    Psoriasis, unspecified    Pulmonary emphysema (CMS-HCC)    Sepsis (CMS-HCC)    Sexually transmitted disease    Skin cancer    Suicide attempt (CMS-HCC)  Tachyarrhythmia    Valvular heart disease       Past Surgical History:  Surgical History:   Procedure Laterality Date    COLONOSCOPY N/A 06/17/2015    Performed by Ledora Catalina, MD at [facility] ENDO    ROBOT ASSISTED LAPAROSCOPIC NEPHRECTOMY PARTIAL WITH INTRAOPERATIVE ULTRASOUND  Left 06/09/2017    Performed by Jackee Milan, MD at [facility] OR    BIOPSY/ EXCISION LYMPH NODE DEEP CERVICAL Left 08/11/2020    Performed by Cleola Bi, MD at [facility] OR    INCISION AND DRAINAGE ISCHIORECTAL/ PERIRECTAL ABSCESS N/A 06/02/2021    Performed by Marchelle Lamar BIRCH, MD at [facility] OR    APPENDECTOMY  Agw 19    CARDIOVASCULAR STRESS TEST  2024    COLONOSCOPY  2018    ECHOCARDIOGRAM PROCEDURE  2023    ELECTROCARDIOGRAM  2023    HERNIA REPAIR      HX APPENDECTOMY  2008    HX SKIN BIOPSY      KIDNEY SURGERY  2019    LYMPH NODE DISSECTION      PR LAPAROSCOPY SURG RPR INITIAL INGUINAL HERNIA      right    SURGERY Pertinent medical/surgical history reviewed  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)    Fracture cervical vertebra-closed (CMS-HCC)    Gastroesophageal reflux disease without esophagitis    Gout    Heart murmur    Human immunodeficiency virus (HIV) disease (CMS-HCC)    Hyperlipidemia    Kidney disease    Lung disease    Malignant neoplasm of urinary system (CMS-HCC)    Melanoma (CMS-HCC)    Other and unspecified hyperlipidemia    Other specified disorders of arteries and arterioles    Pneumonia    Psoriasis, unspecified    Pulmonary emphysema (CMS-HCC)    Sepsis (CMS-HCC)    Sexually transmitted disease    Skin cancer    Suicide attempt (CMS-HCC)    Tachyarrhythmia    Valvular heart disease     Surgical History:   Procedure Laterality Date    COLONOSCOPY N/A 06/17/2015    Performed by Ledora Catalina, MD at [facility] ENDO    ROBOT ASSISTED LAPAROSCOPIC NEPHRECTOMY PARTIAL WITH INTRAOPERATIVE ULTRASOUND  Left 06/09/2017    Performed by Jackee Milan, MD at [facility] OR    BIOPSY/ EXCISION LYMPH NODE DEEP CERVICAL Left 08/11/2020    Performed by Cleola Bi, MD at [facility] OR    INCISION AND DRAINAGE ISCHIORECTAL/ PERIRECTAL ABSCESS N/A 06/02/2021    Performed by Marchelle Lamar BIRCH, MD at [facility] OR    APPENDECTOMY  Agw 19    CARDIOVASCULAR STRESS TEST  2024    COLONOSCOPY  2018    ECHOCARDIOGRAM PROCEDURE  2023    ELECTROCARDIOGRAM  2023    HERNIA REPAIR      HX APPENDECTOMY  2008    HX SKIN BIOPSY  KIDNEY SURGERY  2019    LYMPH NODE DISSECTION      PR LAPAROSCOPY SURG RPR INITIAL INGUINAL HERNIA      right    SURGERY         History obtained from patient.    Social History:  Social History     Tobacco Use    Smoking status: Former     Current packs/day: 0.00     Average packs/day: 2.2 packs/day for 52.9 years (118.9 ttl pk-yrs)     Types: Cigarettes     Start date: 79     Quit date: 09/27/2023     Years since quitting: 0.2     Passive exposure: Current    Smokeless tobacco: Former    Tobacco comments:     Cutting down slowly.    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             Family History:  Family History   Problem Relation Name Age of Onset    High Cholesterol Mother Mom     Stroke Mother Mom     Dementia Mother Mom     Melanoma Father Dad ADITHYA DIFRANCESCO     Cancer Father Dad ZAYDE STROUPE         Oral mandibular    Depression Sister Almarie Favorite         N/A    Hypertension Brother      Diabetes Brother      Cancer-Colon Maternal Aunt Georgia  [address]f 70    Cancer-Breast Maternal Aunt Georgia  Rollf     Cancer-Lung Maternal Uncle Lewis waldo     Aortic Disease/Dissection Paternal Uncle Jerame Hedding         Valve replacement    Heart Disease Paternal Uncle Ladarren Steiner     Cancer-Ovarian Neg Hx      Cancer-Prostate Neg Hx            abacavir -dolutegravir -lamivud (TRIUMEQ ) 600-50-300 mg tablet Take 1 Tablet by mouth once daily    atorvastatin  (LIPITOR) 40 mg tablet Take one tablet by mouth at bedtime daily.    furosemide  (LASIX ) 40 mg tablet Take one tablet by mouth every morning.    metoprolol  succinate XL (TOPROL  XL) 25 mg extended release tablet Take 1 Tablet by mouth daily.    potassium chloride  SR (KLOR-CON  M20) 20 mEq tablet Take one tablet by mouth daily. Take with a meal and a full glass of water .    sildenafiL  (VIAGRA ) 50 mg tablet TAKE 1 TABLET BY MOUTH AS NEEDED FOR ED    valACYclovir  (VALTREX ) 1 gram tablet Take two tablets by mouth twice daily.       Vitals:  Vitals:    12/23/23 1650   BP: 119/83   Pulse: 103   Temp: 97.9 ?F (36.6 ?C)   Resp: 18   SpO2: 98%   TempSrc: Oral   Weight: 65.8 kg (145 lb)        Physical Exam:  Physical Exam  Vitals and nursing note reviewed.   Constitutional:       General: He is not in acute distress.     Appearance: Normal appearance.   HENT:      Head: Normocephalic. Laceration present. No raccoon eyes or Battle's sign.     Eyes:      Extraocular Movements: Extraocular movements intact.      Pupils: Pupils are equal, round, and reactive to light.   Skin:     General: Skin is warm and dry.      Capillary Refill: Capillary refill takes less than 2 seconds.   Neurological:      Mental Status: He is alert and oriented to person, place, and time.         Laceration Repair    Date/Time: 12/23/2023 4:45 PM    Performed by: Elda Oliva BRAVO, MD  Authorized by: Elda Oliva BRAVO, MD  Consent given by: patient  Procedural risks discussed:  Pain, possible loss of function, scars and infection    Patient states understanding of procedure being performed, patient's understanding of procedure matches consent, procedure consent matches procedure scheduled, relevant documents present and verified. Patient identity confirmed, immediately prior to the procedure a time out was called. If applicable, test results available and properly labeled, site marked, imaging studies available, required blood products, implants, devices and special equipment available.    Consent obtained: verbal  Body area: head/neck  Location details: forehead  Laceration length: 1.2 cm  Foreign bodies: no foreign bodies  Tendon involvement: none  Nerve involvement: none  Vascular damage: no    Sedation:  Patient sedated: no  Preparation: Patient was prepped and draped in the usual sterile fashion.  Irrigation solution: saline (hibiclens surgical scrub soap)  Amount of cleaning: standard  Debridement: none  Degree of undermining: none  Skin closure: glue  Approximation: close  Approximation difficulty: simple  Dressing: left open to air so the dermabond can finish setting up.  Patient tolerance: patient tolerated the procedure well with no immediate complications          Laboratory Results:        Radiology Interpretation:      EKG:    Facility Administered Meds:               Clinical Impression:  JAKEOB TULLIS is a 53 y.o. male   Assessment & Plan  Laceration of scalp/head  Acute laceration of the scalp/head from hitting a pole, occurring approximately two hours prior to evaluation. The laceration is a straight line, barely oozing, and well-aligned for closure. No significant bleeding or vision changes reported. Tetanus vaccination is up to date as of July 2024.  - Cleaned the wound with Hibiclens and chlorhexidine scrub.  - Applied medical-grade glue to close the laceration.  - Advised against using antibiotic ointment or oily substances on the glued area.  - Instructed to keep the area dry and avoid scrubbing.  - Recommended using ice packs to reduce swelling and bruising.  - Advised to keep the head elevated to prevent further swelling.  - Provided education on the natural peeling of the glue in 5-7 days.        Disposition/Follow up       1. Facial laceration, initial encounter            Medications:  No orders of the defined types were placed in this encounter.        Procedure Notes:      Patient  Instructions:    Patient Instructions   Please check MyChart for your results which may take up to 72 hours.   We will send a MyChart results message but attempt to call you if you still need to sign up for MyChart.   Information to sign up to MyChart is on your After Visit Summary (AVS)  The MyChart help number is [phone], and the nurse triage number is [phone].       May cover with band aid if needed after 630 pm.    Avoid using any antibiotic ointment, face lotion or oily products on the face as it will make the glue come off early.            Oliva FORBES Rain, MD       [1]   Allergies  Allergen Reactions    Sulfa (Sulfonamide Antibiotics) SEE COMMENTS     Patient reports that his father and brother are allergic, he is unsure so he reports it as an allergy.

## 2023-12-26 ENCOUNTER — Encounter: Admit: 2023-12-26 | Discharge: 2023-12-26 | Payer: PRIVATE HEALTH INSURANCE

## 2023-12-27 ENCOUNTER — Encounter: Admit: 2023-12-27 | Discharge: 2023-12-27 | Payer: PRIVATE HEALTH INSURANCE

## 2023-12-27 MED ORDER — SILDENAFIL 50 MG PO TAB
ORAL_TABLET | ORAL | 2 refills | 45.00000 days | Status: AC
Start: 2023-12-27 — End: ?

## 2023-12-27 NOTE — Telephone Encounter [36]
 Last seen 10/22    Last Filled 09/15 #20-2 refills      Next appt-Nothing

## 2023-12-28 ENCOUNTER — Ambulatory Visit: Admit: 2023-12-28 | Discharge: 2023-12-28 | Payer: PRIVATE HEALTH INSURANCE

## 2023-12-29 ENCOUNTER — Ambulatory Visit: Admit: 2023-12-29 | Discharge: 2023-12-29 | Payer: PRIVATE HEALTH INSURANCE

## 2023-12-29 ENCOUNTER — Encounter: Admit: 2023-12-29 | Discharge: 2023-12-29 | Payer: PRIVATE HEALTH INSURANCE

## 2023-12-29 MED ORDER — PROPOFOL INJ 10 MG/ML IV VIAL
INTRAVENOUS | 0 refills | Status: DC
Start: 2023-12-29 — End: 2023-12-29

## 2023-12-29 MED ORDER — ROCURONIUM 10 MG/ML IV SOLN
INTRAVENOUS | 0 refills | Status: DC
Start: 2023-12-29 — End: 2023-12-29

## 2023-12-29 MED ORDER — GLYCOPYRROLATE 0.2 MG/ML IJ SOLN
INTRAVENOUS | 0 refills | Status: DC
Start: 2023-12-29 — End: 2023-12-29

## 2023-12-29 MED ORDER — DEXMEDETOMIDINE IN 0.9 % NACL 20 MCG/5 ML (4 MCG/ML) IV SYRG
INTRAVENOUS | 0 refills | Status: DC
Start: 2023-12-29 — End: 2023-12-29

## 2023-12-29 MED ORDER — ARTIFICIAL TEARS (PF) SINGLE DOSE DROPS GROUP
OPHTHALMIC | 0 refills | Status: DC
Start: 2023-12-29 — End: 2023-12-29

## 2023-12-29 MED ORDER — PHENYLEPHRINE HCL IN 0.9% NACL 1 MG/10 ML (100 MCG/ML) IV SYRG
INTRAVENOUS | 0 refills | Status: DC
Start: 2023-12-29 — End: 2023-12-29

## 2023-12-29 MED ORDER — DEXAMETHASONE SODIUM PHOSPHATE 4 MG/ML IJ SOLN
INTRAVENOUS | 0 refills | Status: DC
Start: 2023-12-29 — End: 2023-12-29

## 2023-12-29 MED ORDER — EPHEDRINE SULFATE 50 MG/5ML SYR (10 MG/ML) (AN)(OSM)
INTRAVENOUS | 0 refills | Status: DC
Start: 2023-12-29 — End: 2023-12-29

## 2023-12-29 MED ORDER — LACTATED RINGERS IV SOLP
INTRAVENOUS | 0 refills | Status: DC
Start: 2023-12-29 — End: 2023-12-29

## 2023-12-29 MED ORDER — LIDOCAINE (PF) 20 MG/ML (2 %) IJ SOLN
INTRAVENOUS | 0 refills | Status: DC
Start: 2023-12-29 — End: 2023-12-29

## 2023-12-29 MED ORDER — ONDANSETRON HCL (PF) 4 MG/2 ML IJ SOLN
INTRAVENOUS | 0 refills | Status: DC
Start: 2023-12-29 — End: 2023-12-29

## 2023-12-29 MED ORDER — FENTANYL CITRATE (PF) 50 MCG/ML IJ SOLN
INTRAVENOUS | 0 refills | Status: DC
Start: 2023-12-29 — End: 2023-12-29

## 2023-12-29 MED ORDER — SUGAMMADEX 100 MG/ML IV SOLN
INTRAVENOUS | 0 refills | Status: DC
Start: 2023-12-29 — End: 2023-12-29

## 2023-12-29 MED ADMIN — SODIUM CHLORIDE 0.9% IV BOLUS [214590]: 1000.0000 mL | INTRAVENOUS | @ 17:00:00 | Stop: 2023-12-29 | NDC 00338004904

## 2023-12-29 MED ADMIN — CEFOXITIN 2 GRAM IV SOLR [9463]: 2 g | INTRAVENOUS | @ 17:00:00 | Stop: 2023-12-29 | NDC 44567024625

## 2023-12-31 ENCOUNTER — Encounter: Admit: 2023-12-31 | Discharge: 2023-12-31 | Payer: PRIVATE HEALTH INSURANCE

## 2024-01-04 ENCOUNTER — Ambulatory Visit: Admit: 2024-01-04 | Discharge: 2024-01-05 | Payer: PRIVATE HEALTH INSURANCE

## 2024-01-04 ENCOUNTER — Encounter: Admit: 2024-01-04 | Discharge: 2024-01-04 | Payer: PRIVATE HEALTH INSURANCE

## 2024-01-04 VITALS — BP 117/62 | HR 92 | Temp 98.50000°F | Ht 63.78 in | Wt 144.0 lb

## 2024-01-04 DIAGNOSIS — R7689 Hepatitis B core antibody positive: Secondary | ICD-10-CM

## 2024-01-04 DIAGNOSIS — Z21 Asymptomatic human immunodeficiency virus [HIV] infection status: Principal | ICD-10-CM

## 2024-01-04 NOTE — Progress Notes [1]
 I gave [place] doses of Heplisav B and Spikevax. Beforehand, we reviewed the consent together . No adverse reaction noted after administration.

## 2024-01-04 NOTE — Progress Notes [1]
 Pharmacy Medication Reassessment      Summary of Visit  Charles Walls is doing well with his medications.  He takes Triumeq  daily and denies any missed doses or side effects.     He continues on atorvastatin  20mg  daily.     The 10-year ASCVD risk score (Arnett DK, et al., 2019) is: 2.9%    Values used to calculate the score:      Age: 53 years      Clinically relevant sex: Male      Is Non-Hispanic African American: No      Diabetic: No      Tobacco smoker: No      Systolic Blood Pressure: 109 mmHg      Is BP treated: No      HDL Cholesterol: 40 mg/dL      Total Cholesterol: 132 mg/dL      Indication/Regimen  The regimen of TRIUMEQ  600-50-300 MG PO TAB indefinitely is appropriate for Charles Walls who has HIV (human immunodeficiency virus infection) (CMS-HCC).    Renal dose adjustments are not required. Hepatic dose adjustments are not required. Dose titration is not required.    The patient has the ability to self-administer the medication(s).      Baseline Characteristics    Patient is negative for HLA-B*[id number] and is okay to proceed with abacavir  treatment.    Regimen Assessment  The patient is on a single tablet regimen.    The patient's regimen was not changed.    Therapeutic Goals and Monitoring  Patient is HIV positive. HIV genotype has been reviewed and treatment with select therapy is appropriate. The goal of therapy is to decrease viral load and increase CD4 count.    Patient has completed labs. Patient's viral load is suppressed.    The patient is making progress toward achieving their therapeutic goal. The plan is to continue current therapy.      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    Lymphadenopathy    Spondylosis of cervical joint    Reactive lymphoid hyperplasia    Immunodeficiency, unspecified    Pulmonary emphysema (CMS-HCC)    History of syphilis    Perirectal abscess    Lymphoma of lymph nodes of neck, unspecified lymphoma type (CMS-HCC)    MDD (major depressive disorder), recurrent, in full remission    Aortic calcification    Congestive heart failure, NYHA class II, chronic, diastolic (CMS-HCC)    MGUS (monoclonal gammopathy of unknown significance)     Additional comorbidities: no      Labs and Diagnostic Tests  Lab Results   Component Value Date/Time    HEPBSAG NONREACTIVE 08/27/2020 11:52 AM    HEPBSAB NEG 08/27/2020 11:52 AM    HEPBCTOTAL REACTIVE (A) 08/27/2020 11:52 AM       Lab Results   Component Value Date/Time    HEPCAB NONREACTIVE 02/06/2021 09:10 AM       Lab Results   Component Value Date/Time    HIVPCR Detected (A) 12/20/2023 01:57 PM    CD4  709 09/24/2023 06:13 PM    HIVULTRA HIV-1 RNA DETECTED, LESS THAN 20 RNA COPIES/ML (A) 11/04/2021 10:52 AM    HIVCOPIESML <20 12/20/2023 01:57 PM           Allergies  Allergies[1]     Immunizations  Vaccine history was reviewed with the patient. Education was provided on the importance of completing vaccines, including annual influenza vaccine, COVID-19 vaccine and hepatitis B vaccine if not immune.    Immunization History   Administered Date(s) Administered    COVID-19 (PFIZER), mRNA vacc, 30 mcg/0.3 mL (PF) 05/07/2019, 05/28/2019    Covid-19 mRNA Vaccine >=12yo (Moderna)(Spikevax) 11/04/2021    Flu Vaccine =>6 Months Quadrivalent PF 11/18/2020    Flu Vaccine =>6 Months Trivalent PF 10/04/2013    Flu Vaccine =>54 YO High-Dose (PF) 02/11/2016, 11/24/2016    Flu Vaccine =>54 YO High-Dose Quadrivalent (PF) 02/07/2019, 11/04/2021    Flu Vaccine =>54 YO High-dose Trivalent PF 09/28/2023    Hepatitis B Vaccine Adult 3 Dose IM 10/01/2013    Meningococcal Conjug Vaccine IM (MenACWY-CRM)(Menveo) 06/18/2015, 02/11/2016, 04/14/2022    Pneumococcal Vaccine (20-Val) 01/19/2022    Pneumococcal Vaccine (23-Val Adult) 10/01/2013    Pneumococcal Vaccine(13-Val Peds/immunocompromised adult) 07/31/2013    Tdap Vaccine 05/11/2013, 08/09/2019, 08/11/2022    Zoster Vaccine Recombinant, Adjuvanted (shingles) IM (vial 2 of 2)(SHINGRIX) 09/21/2022    Zoster Vaccine Recombinant, adjuvant suspension component (vial 1 of 2)(SHINGRIX) 09/21/2022    smallpox and Mpox Vaccine Live PF ID 09/19/2020, 02/20/2021       Medication Reconciliation  Medication history and reconciliation were performed (including prescription medications, supplements, over the counter, and herbal products). The medication list was updated and the patient's current medication list is included. The patient was instructed to speak with their health care provider before starting any new drug, including prescription or over the counter, natural / herbal products, or vitamins.    Drug Interactions    Drug-Drug Interactions  Drug-drug interactions were evaluated. There were not clinically significant drug-drug interactions.     Drug-Food Interactions  Drug-food interactions were evaluated. There are not clinically significant drug-food interactions.    Triumeq  should be taken with or without food.      Home Medications   Medication Sig   abacavir -dolutegravir -lamivud (TRIUMEQ ) 600-50-300 mg tablet Take 1 Tablet by mouth once daily   acetaminophen  SR (TYLENOL  ARTHRITIS PAIN) 650 mg tablet Take one tablet by mouth every 8 hours as needed for Pain. Indications: pain   atorvastatin  (LIPITOR) 40 mg tablet Take one tablet by mouth at bedtime daily.   furosemide  (LASIX ) 40 mg tablet Take one tablet by mouth every morning.  Patient not taking: Reported on 12/27/2023   metoprolol  succinate XL (TOPROL  XL) 25 mg extended release tablet Take 1 Tablet by mouth daily.   oxyCODONE  (ROXICODONE ) 5 mg tablet Take one tablet by mouth every 6 hours as needed for Pain. Indications: pain   polyethylene glycol 3350  (MIRALAX ) 17 g packet Take one packet by mouth daily. Indications: constipation, emptying of the bowel   sennosides-docusate sodium  (SENNA PLUS) 8.6/50 mg tablet Take one tablet by mouth daily. Indications: constipation   sildenafiL  (VIAGRA ) 50 mg tablet TAKE 1 TABLET BY MOUTH AS NEEDED FOR ED   valACYclovir  (VALTREX ) 1 gram tablet Take two tablets by mouth twice daily.  Patient taking differently: Take two tablets by mouth as Needed.       Adverse Drug Reactions  Adverse drug reactions were reviewed with the patient.    Significant adverse drug reaction(s)  were not identified.    Side effect(s) were not reported.      Adherence  Refill and adherence history were reviewed with the patient. The patient was educated on the importance of adherence.    Patient is adherent with refills: yes  Patient is meeting refill adherence goal: yes    Patient reported 0 missed doses over the past 1 month.   Patient is meeting reported adherence goal.      Safety Precautions    Risk Evaluation and Mitigation (REMS) Assessment: REMS is not required for this medication.    Safety precautions were addressed and discussed with the patient as applicable.    Contraindications: BRENTYN SEEHAFER does not have contraindications to this medication.    Toxoplasmosis screening, sexually transmitted infections testing, and comorbid conditions assessment:    Patient has been screened for tuberculosis, toxoplasmosis, STIs, and assessed for comorbid conditions as appropriate.    Depression screening:    Patient has been appropriately screened by healthcare team.    Substance abuse screening (tobacco, alcohol, illicit substances):    Patient was screened for substance abuse and counseling was provided.      Pregnancy Status: Male, education not applicable.      Medication Education  Counseling was not completed because patient was previously educated and did not require additional counseling.    ARMEND HOCHSTATTER was given the opportunity to ask questions. Patient did not have any questions at this time. Patient was reminded of the refill process and encouraged to call with questions. The monitoring and follow-up plan was discussed with the patient. The patient was instructed to contact their health care provider if their symptoms or health problems do not get better or if they become worse. For clinical questions about this medication, the pharmacist can be reached at [phone]. For questions about cost, insurance coverage, or to obtain refills, the patient should contact the pharmacy via MyChart or by calling [phone]. The patient verbalized acceptance and understanding.    Follow-up Plan  The patient will be reassessed within 1 year.    Discussed option to fill at a [facility] pharmacy. The medication(s) will be received from an outside pharmacy [facility]) based on patient preference due to preference to continue to fill at their current pharmacy.       Christia Coaxum, PHARMD         [1]   Allergies  Allergen Reactions    Sulfa (Sulfonamide Antibiotics) SEE COMMENTS     Patient reports that his father and brother are allergic, he is unsure so he reports it as an allergy.

## 2024-01-05 ENCOUNTER — Encounter: Admit: 2024-01-05 | Discharge: 2024-01-05 | Payer: PRIVATE HEALTH INSURANCE

## 2024-01-05 DIAGNOSIS — Z8619 Personal history of other infectious and parasitic diseases: Secondary | ICD-10-CM

## 2024-01-05 DIAGNOSIS — E559 Vitamin D deficiency, unspecified: Secondary | ICD-10-CM

## 2024-01-19 ENCOUNTER — Encounter: Admit: 2024-01-19 | Discharge: 2024-01-19 | Payer: PRIVATE HEALTH INSURANCE

## 2024-01-26 ENCOUNTER — Encounter: Admit: 2024-01-26 | Discharge: 2024-01-26 | Payer: PRIVATE HEALTH INSURANCE

## 2024-02-23 ENCOUNTER — Encounter: Admit: 2024-02-23 | Discharge: 2024-02-23 | Payer: PRIVATE HEALTH INSURANCE

## 2024-02-23 MED ORDER — SILDENAFIL 50 MG PO TAB
ORAL_TABLET | ORAL | 2 refills | 45.00000 days | Status: AC
Start: 2024-02-23 — End: ?

## 2024-02-23 NOTE — Telephone Encounter [36]
 Last seen October 2025    Last Filled 12/09 #20-2 refills    Next Appt-Nothing
